# Patient Record
Sex: Male | Born: 1935 | Race: White | Hispanic: No | Marital: Married | Smoking: Never smoker
Health system: Southern US, Community
[De-identification: ages and names within clinical notes are randomized; demographics above are authoritative.]

## PROBLEM LIST (undated history)

## (undated) DIAGNOSIS — I340 Nonrheumatic mitral (valve) insufficiency: Secondary | ICD-10-CM

## (undated) DIAGNOSIS — I2510 Atherosclerotic heart disease of native coronary artery without angina pectoris: Secondary | ICD-10-CM

## (undated) DIAGNOSIS — E039 Hypothyroidism, unspecified: Secondary | ICD-10-CM

## (undated) DIAGNOSIS — I480 Paroxysmal atrial fibrillation: Secondary | ICD-10-CM

## (undated) DIAGNOSIS — E785 Hyperlipidemia, unspecified: Secondary | ICD-10-CM

## (undated) DIAGNOSIS — I428 Other cardiomyopathies: Secondary | ICD-10-CM

## (undated) DIAGNOSIS — Z95810 Presence of automatic (implantable) cardiac defibrillator: Secondary | ICD-10-CM

## (undated) DIAGNOSIS — I10 Essential (primary) hypertension: Secondary | ICD-10-CM

## (undated) DIAGNOSIS — Z7901 Long term (current) use of anticoagulants: Secondary | ICD-10-CM

## (undated) DIAGNOSIS — I509 Heart failure, unspecified: Secondary | ICD-10-CM

## (undated) HISTORY — DX: Other cardiomyopathies: I42.8

## (undated) HISTORY — DX: Nonrheumatic mitral (valve) insufficiency: I34.0

## (undated) HISTORY — PX: INSERT / REPLACE / REMOVE PACEMAKER: SUR710

## (undated) HISTORY — DX: Hyperlipidemia, unspecified: E78.5

## (undated) HISTORY — DX: Paroxysmal atrial fibrillation: I48.0

## (undated) HISTORY — DX: Presence of automatic (implantable) cardiac defibrillator: Z95.810

## (undated) HISTORY — DX: Hypothyroidism, unspecified: E03.9

## (undated) HISTORY — DX: Long term (current) use of anticoagulants: Z79.01

## (undated) HISTORY — DX: Atherosclerotic heart disease of native coronary artery without angina pectoris: I25.10

## (undated) HISTORY — DX: Essential (primary) hypertension: I10

---

## 2000-08-15 ENCOUNTER — Ambulatory Visit (HOSPITAL_BASED_OUTPATIENT_CLINIC_OR_DEPARTMENT_OTHER): Admission: RE | Admit: 2000-08-15 | Discharge: 2000-08-15 | Payer: Self-pay | Admitting: Internal Medicine

## 2000-10-15 ENCOUNTER — Encounter: Payer: Self-pay | Admitting: General Surgery

## 2000-10-17 ENCOUNTER — Ambulatory Visit (HOSPITAL_COMMUNITY): Admission: RE | Admit: 2000-10-17 | Discharge: 2000-10-17 | Payer: Self-pay | Admitting: General Surgery

## 2001-09-04 DIAGNOSIS — I480 Paroxysmal atrial fibrillation: Secondary | ICD-10-CM

## 2001-09-04 DIAGNOSIS — I2510 Atherosclerotic heart disease of native coronary artery without angina pectoris: Secondary | ICD-10-CM

## 2001-09-04 HISTORY — DX: Atherosclerotic heart disease of native coronary artery without angina pectoris: I25.10

## 2001-09-04 HISTORY — PX: CORONARY ARTERY BYPASS GRAFT: SHX141

## 2001-09-04 HISTORY — DX: Paroxysmal atrial fibrillation: I48.0

## 2002-03-23 ENCOUNTER — Inpatient Hospital Stay (HOSPITAL_COMMUNITY): Admission: EM | Admit: 2002-03-23 | Discharge: 2002-04-05 | Payer: Self-pay | Admitting: Cardiology

## 2002-03-23 ENCOUNTER — Encounter: Payer: Self-pay | Admitting: Emergency Medicine

## 2002-03-23 ENCOUNTER — Encounter: Payer: Self-pay | Admitting: Cardiology

## 2002-03-23 ENCOUNTER — Emergency Department (HOSPITAL_COMMUNITY): Admission: EM | Admit: 2002-03-23 | Discharge: 2002-03-23 | Payer: Self-pay | Admitting: Emergency Medicine

## 2002-03-25 ENCOUNTER — Encounter: Payer: Self-pay | Admitting: Cardiology

## 2002-03-27 ENCOUNTER — Encounter: Payer: Self-pay | Admitting: Cardiology

## 2002-03-30 ENCOUNTER — Encounter: Payer: Self-pay | Admitting: Cardiology

## 2002-03-31 ENCOUNTER — Encounter: Payer: Self-pay | Admitting: Surgery

## 2002-04-01 ENCOUNTER — Encounter: Payer: Self-pay | Admitting: Surgery

## 2002-04-02 ENCOUNTER — Encounter: Payer: Self-pay | Admitting: Surgery

## 2002-04-29 ENCOUNTER — Encounter (HOSPITAL_COMMUNITY): Admission: RE | Admit: 2002-04-29 | Discharge: 2002-05-29 | Payer: Self-pay | Admitting: Cardiology

## 2002-05-30 ENCOUNTER — Encounter (HOSPITAL_COMMUNITY): Admission: RE | Admit: 2002-05-30 | Discharge: 2002-06-29 | Payer: Self-pay | Admitting: Cardiology

## 2002-06-30 ENCOUNTER — Encounter (HOSPITAL_COMMUNITY): Admission: RE | Admit: 2002-06-30 | Discharge: 2002-07-30 | Payer: Self-pay | Admitting: Cardiology

## 2002-08-06 ENCOUNTER — Encounter (HOSPITAL_COMMUNITY): Admission: RE | Admit: 2002-08-06 | Discharge: 2002-09-05 | Payer: Self-pay | Admitting: Cardiology

## 2004-10-31 ENCOUNTER — Ambulatory Visit: Payer: Self-pay | Admitting: Cardiology

## 2005-09-04 HISTORY — PX: CARDIAC DEFIBRILLATOR PLACEMENT: SHX171

## 2005-10-24 ENCOUNTER — Ambulatory Visit: Payer: Self-pay | Admitting: Cardiology

## 2005-11-01 ENCOUNTER — Ambulatory Visit: Payer: Self-pay

## 2005-11-01 ENCOUNTER — Encounter: Payer: Self-pay | Admitting: Cardiology

## 2005-11-21 ENCOUNTER — Ambulatory Visit: Payer: Self-pay | Admitting: Cardiology

## 2005-12-25 ENCOUNTER — Ambulatory Visit: Payer: Self-pay | Admitting: Cardiology

## 2006-01-15 ENCOUNTER — Ambulatory Visit: Payer: Self-pay

## 2006-01-15 ENCOUNTER — Encounter: Payer: Self-pay | Admitting: Cardiology

## 2006-01-25 ENCOUNTER — Ambulatory Visit: Payer: Self-pay

## 2006-01-25 ENCOUNTER — Ambulatory Visit: Payer: Self-pay | Admitting: Cardiology

## 2006-01-31 ENCOUNTER — Observation Stay (HOSPITAL_COMMUNITY): Admission: RE | Admit: 2006-01-31 | Discharge: 2006-02-01 | Payer: Self-pay | Admitting: Internal Medicine

## 2006-01-31 ENCOUNTER — Ambulatory Visit: Payer: Self-pay | Admitting: Internal Medicine

## 2006-02-14 ENCOUNTER — Ambulatory Visit: Payer: Self-pay

## 2006-05-22 ENCOUNTER — Ambulatory Visit: Payer: Self-pay | Admitting: Internal Medicine

## 2006-06-15 ENCOUNTER — Ambulatory Visit: Payer: Self-pay | Admitting: Cardiology

## 2006-06-19 ENCOUNTER — Ambulatory Visit: Payer: Self-pay | Admitting: Cardiology

## 2006-06-26 ENCOUNTER — Ambulatory Visit: Payer: Self-pay | Admitting: *Deleted

## 2006-07-05 ENCOUNTER — Ambulatory Visit: Payer: Self-pay | Admitting: Cardiology

## 2006-07-19 ENCOUNTER — Ambulatory Visit: Payer: Self-pay | Admitting: Cardiology

## 2006-07-23 ENCOUNTER — Ambulatory Visit: Payer: Self-pay | Admitting: Cardiology

## 2006-08-07 ENCOUNTER — Ambulatory Visit (HOSPITAL_COMMUNITY): Admission: RE | Admit: 2006-08-07 | Discharge: 2006-08-07 | Payer: Self-pay | Admitting: Cardiology

## 2006-08-07 ENCOUNTER — Ambulatory Visit: Payer: Self-pay | Admitting: Cardiology

## 2006-08-21 ENCOUNTER — Ambulatory Visit: Payer: Self-pay | Admitting: Cardiology

## 2006-09-06 ENCOUNTER — Ambulatory Visit: Payer: Self-pay | Admitting: Cardiology

## 2006-09-10 ENCOUNTER — Ambulatory Visit: Payer: Self-pay | Admitting: Cardiology

## 2006-09-10 ENCOUNTER — Ambulatory Visit: Payer: Self-pay | Admitting: Internal Medicine

## 2006-09-17 ENCOUNTER — Ambulatory Visit: Payer: Self-pay | Admitting: Cardiology

## 2006-10-24 ENCOUNTER — Ambulatory Visit: Payer: Self-pay | Admitting: Internal Medicine

## 2006-10-24 ENCOUNTER — Ambulatory Visit: Payer: Self-pay | Admitting: Cardiology

## 2006-10-31 ENCOUNTER — Ambulatory Visit: Payer: Self-pay | Admitting: Cardiovascular Disease

## 2006-11-03 ENCOUNTER — Emergency Department (HOSPITAL_COMMUNITY): Admission: EM | Admit: 2006-11-03 | Discharge: 2006-11-03 | Payer: Self-pay | Admitting: Emergency Medicine

## 2006-11-29 ENCOUNTER — Ambulatory Visit: Payer: Self-pay | Admitting: Cardiology

## 2006-12-27 ENCOUNTER — Ambulatory Visit: Payer: Self-pay | Admitting: Cardiology

## 2007-01-17 ENCOUNTER — Ambulatory Visit: Payer: Self-pay | Admitting: Internal Medicine

## 2007-01-17 ENCOUNTER — Ambulatory Visit: Payer: Self-pay

## 2007-01-17 ENCOUNTER — Encounter: Payer: Self-pay | Admitting: Cardiovascular Disease

## 2007-01-22 ENCOUNTER — Ambulatory Visit: Payer: Self-pay | Admitting: Cardiology

## 2007-02-26 ENCOUNTER — Ambulatory Visit: Payer: Self-pay | Admitting: Cardiology

## 2007-03-19 ENCOUNTER — Ambulatory Visit: Payer: Self-pay | Admitting: Internal Medicine

## 2007-04-01 ENCOUNTER — Ambulatory Visit: Payer: Self-pay | Admitting: Internal Medicine

## 2007-04-08 ENCOUNTER — Ambulatory Visit: Payer: Self-pay | Admitting: Cardiology

## 2007-04-22 ENCOUNTER — Ambulatory Visit: Payer: Self-pay | Admitting: Cardiology

## 2007-05-02 ENCOUNTER — Ambulatory Visit: Payer: Self-pay | Admitting: Cardiology

## 2007-05-02 ENCOUNTER — Ambulatory Visit: Payer: Self-pay | Admitting: Internal Medicine

## 2007-05-27 ENCOUNTER — Ambulatory Visit: Payer: Self-pay | Admitting: Cardiology

## 2007-07-01 ENCOUNTER — Ambulatory Visit: Payer: Self-pay | Admitting: Internal Medicine

## 2007-07-15 ENCOUNTER — Ambulatory Visit: Payer: Self-pay | Admitting: Cardiology

## 2007-07-25 ENCOUNTER — Ambulatory Visit: Payer: Self-pay | Admitting: Internal Medicine

## 2007-08-15 ENCOUNTER — Ambulatory Visit: Payer: Self-pay | Admitting: Internal Medicine

## 2007-08-28 ENCOUNTER — Emergency Department (HOSPITAL_COMMUNITY): Admission: EM | Admit: 2007-08-28 | Discharge: 2007-08-28 | Payer: Self-pay | Admitting: Emergency Medicine

## 2007-09-02 ENCOUNTER — Ambulatory Visit: Payer: Self-pay | Admitting: Orthopedic Surgery

## 2007-09-02 ENCOUNTER — Inpatient Hospital Stay (HOSPITAL_COMMUNITY): Admission: EM | Admit: 2007-09-02 | Discharge: 2007-09-05 | Payer: Self-pay | Admitting: Emergency Medicine

## 2007-09-02 ENCOUNTER — Ambulatory Visit: Payer: Self-pay | Admitting: Cardiovascular Disease

## 2007-09-03 ENCOUNTER — Encounter (INDEPENDENT_AMBULATORY_CARE_PROVIDER_SITE_OTHER): Payer: Self-pay | Admitting: *Deleted

## 2007-09-04 ENCOUNTER — Encounter: Payer: Self-pay | Admitting: Orthopedic Surgery

## 2007-09-05 ENCOUNTER — Encounter: Payer: Self-pay | Admitting: Orthopedic Surgery

## 2007-09-10 ENCOUNTER — Encounter: Payer: Self-pay | Admitting: Orthopedic Surgery

## 2007-09-11 ENCOUNTER — Telehealth: Payer: Self-pay | Admitting: Orthopedic Surgery

## 2007-09-19 ENCOUNTER — Ambulatory Visit: Payer: Self-pay | Admitting: Internal Medicine

## 2007-09-26 ENCOUNTER — Ambulatory Visit: Payer: Self-pay | Admitting: Orthopedic Surgery

## 2007-09-30 ENCOUNTER — Encounter (HOSPITAL_COMMUNITY): Admission: RE | Admit: 2007-09-30 | Discharge: 2007-10-30 | Payer: Self-pay | Admitting: Orthopedic Surgery

## 2007-09-30 ENCOUNTER — Encounter: Payer: Self-pay | Admitting: Orthopedic Surgery

## 2007-10-11 ENCOUNTER — Encounter: Payer: Self-pay | Admitting: Orthopedic Surgery

## 2007-11-15 ENCOUNTER — Ambulatory Visit: Payer: Self-pay | Admitting: Cardiology

## 2007-11-28 ENCOUNTER — Ambulatory Visit: Payer: Self-pay | Admitting: Orthopedic Surgery

## 2007-12-06 ENCOUNTER — Ambulatory Visit: Payer: Self-pay | Admitting: Cardiology

## 2008-01-06 ENCOUNTER — Ambulatory Visit: Payer: Self-pay | Admitting: Cardiology

## 2008-01-13 ENCOUNTER — Ambulatory Visit: Payer: Self-pay | Admitting: Internal Medicine

## 2008-01-13 ENCOUNTER — Ambulatory Visit: Payer: Self-pay | Admitting: Cardiology

## 2008-02-03 ENCOUNTER — Ambulatory Visit: Payer: Self-pay | Admitting: Cardiology

## 2008-03-04 ENCOUNTER — Ambulatory Visit: Payer: Self-pay | Admitting: Cardiovascular Disease

## 2008-03-18 ENCOUNTER — Ambulatory Visit: Payer: Self-pay | Admitting: Cardiology

## 2008-04-20 ENCOUNTER — Ambulatory Visit: Payer: Self-pay | Admitting: Internal Medicine

## 2008-04-22 ENCOUNTER — Ambulatory Visit: Payer: Self-pay | Admitting: Cardiology

## 2008-05-20 ENCOUNTER — Ambulatory Visit: Payer: Self-pay | Admitting: Cardiology

## 2008-06-18 ENCOUNTER — Ambulatory Visit: Payer: Self-pay | Admitting: Cardiology

## 2008-07-20 ENCOUNTER — Ambulatory Visit: Payer: Self-pay | Admitting: Internal Medicine

## 2008-07-20 ENCOUNTER — Ambulatory Visit: Payer: Self-pay | Admitting: Cardiology

## 2008-07-23 ENCOUNTER — Ambulatory Visit: Payer: Self-pay | Admitting: Cardiology

## 2008-08-07 ENCOUNTER — Ambulatory Visit: Payer: Self-pay | Admitting: Cardiology

## 2008-08-07 LAB — CONVERTED CEMR LAB
CO2: 30 meq/L (ref 19–32)
Calcium: 8.8 mg/dL (ref 8.4–10.5)
Chloride: 108 meq/L (ref 96–112)
Creatinine, Ser: 1.2 mg/dL (ref 0.4–1.5)
Potassium: 4.3 meq/L (ref 3.5–5.1)
Sodium: 143 meq/L (ref 135–145)

## 2008-08-20 ENCOUNTER — Ambulatory Visit: Payer: Self-pay | Admitting: Cardiology

## 2008-09-15 ENCOUNTER — Ambulatory Visit: Payer: Self-pay

## 2008-09-15 ENCOUNTER — Ambulatory Visit: Payer: Self-pay | Admitting: Cardiology

## 2008-09-15 ENCOUNTER — Encounter: Payer: Self-pay | Admitting: Cardiology

## 2008-09-17 ENCOUNTER — Ambulatory Visit: Payer: Self-pay | Admitting: Cardiology

## 2008-10-08 ENCOUNTER — Encounter: Payer: Self-pay | Admitting: Internal Medicine

## 2008-10-29 ENCOUNTER — Ambulatory Visit: Payer: Self-pay | Admitting: Cardiology

## 2008-11-03 ENCOUNTER — Ambulatory Visit: Payer: Self-pay | Admitting: Internal Medicine

## 2008-11-26 ENCOUNTER — Ambulatory Visit: Payer: Self-pay | Admitting: Cardiology

## 2008-12-17 ENCOUNTER — Ambulatory Visit: Payer: Self-pay | Admitting: Cardiology

## 2009-01-14 ENCOUNTER — Ambulatory Visit: Payer: Self-pay | Admitting: Cardiology

## 2009-01-28 ENCOUNTER — Ambulatory Visit: Payer: Self-pay | Admitting: Internal Medicine

## 2009-02-18 ENCOUNTER — Ambulatory Visit: Payer: Self-pay | Admitting: Cardiology

## 2009-03-25 ENCOUNTER — Ambulatory Visit: Payer: Self-pay | Admitting: Cardiology

## 2009-03-29 ENCOUNTER — Ambulatory Visit: Payer: Self-pay | Admitting: Cardiology

## 2009-04-14 ENCOUNTER — Encounter: Payer: Self-pay | Admitting: Internal Medicine

## 2009-04-14 ENCOUNTER — Ambulatory Visit: Payer: Self-pay | Admitting: Internal Medicine

## 2009-04-19 ENCOUNTER — Encounter: Payer: Self-pay | Admitting: *Deleted

## 2009-04-29 ENCOUNTER — Ambulatory Visit: Payer: Self-pay | Admitting: Cardiology

## 2009-04-29 LAB — CONVERTED CEMR LAB: POC INR: 2.8

## 2009-05-17 ENCOUNTER — Encounter: Payer: Self-pay | Admitting: Cardiology

## 2009-05-27 ENCOUNTER — Ambulatory Visit: Payer: Self-pay | Admitting: Cardiology

## 2009-06-08 ENCOUNTER — Ambulatory Visit: Payer: Self-pay | Admitting: Cardiology

## 2009-06-10 ENCOUNTER — Encounter: Payer: Self-pay | Admitting: Cardiology

## 2009-06-17 ENCOUNTER — Ambulatory Visit: Payer: Self-pay | Admitting: Cardiology

## 2009-07-15 ENCOUNTER — Ambulatory Visit: Payer: Self-pay | Admitting: Cardiology

## 2009-07-15 LAB — CONVERTED CEMR LAB: POC INR: 2.1

## 2009-08-04 ENCOUNTER — Ambulatory Visit: Payer: Self-pay | Admitting: Internal Medicine

## 2009-08-12 ENCOUNTER — Ambulatory Visit: Payer: Self-pay | Admitting: Cardiology

## 2009-08-12 LAB — CONVERTED CEMR LAB: POC INR: 2.5

## 2009-09-01 ENCOUNTER — Ambulatory Visit: Payer: Self-pay | Admitting: Cardiology

## 2009-09-10 ENCOUNTER — Encounter: Payer: Self-pay | Admitting: Internal Medicine

## 2009-09-10 ENCOUNTER — Ambulatory Visit: Payer: Self-pay | Admitting: Internal Medicine

## 2009-09-15 ENCOUNTER — Encounter: Payer: Self-pay | Admitting: Internal Medicine

## 2009-09-15 LAB — CONVERTED CEMR LAB
BUN: 25 mg/dL — ABNORMAL HIGH (ref 6–23)
Chloride: 108 meq/L (ref 96–112)
Creatinine, Ser: 1.43 mg/dL (ref 0.40–1.50)
Eosinophils Relative: 6 % — ABNORMAL HIGH (ref 0–5)
Hemoglobin: 12.8 g/dL — ABNORMAL LOW (ref 13.0–17.0)
Lymphs Abs: 1 10*3/uL (ref 0.7–4.0)
MCHC: 32.8 g/dL (ref 30.0–36.0)
Monocytes Relative: 8 % (ref 3–12)
Neutrophils Relative %: 72 % (ref 43–77)
Prothrombin Time: 34.8 s — ABNORMAL HIGH (ref 11.6–15.2)
RBC: 4.04 M/uL — ABNORMAL LOW (ref 4.22–5.81)
RDW: 14.2 % (ref 11.5–15.5)
Sodium: 142 meq/L (ref 135–145)
aPTT: 44 s — ABNORMAL HIGH (ref 24–37)

## 2009-09-22 ENCOUNTER — Ambulatory Visit: Payer: Self-pay | Admitting: Internal Medicine

## 2009-09-22 ENCOUNTER — Ambulatory Visit (HOSPITAL_COMMUNITY): Admission: RE | Admit: 2009-09-22 | Discharge: 2009-09-22 | Payer: Self-pay | Admitting: Internal Medicine

## 2009-09-30 ENCOUNTER — Ambulatory Visit: Payer: Self-pay | Admitting: Cardiovascular Disease

## 2009-09-30 ENCOUNTER — Encounter: Payer: Self-pay | Admitting: Cardiology

## 2009-09-30 LAB — CONVERTED CEMR LAB: POC INR: 3.7

## 2009-10-07 ENCOUNTER — Ambulatory Visit: Payer: Self-pay | Admitting: Cardiology

## 2009-10-12 ENCOUNTER — Ambulatory Visit: Payer: Self-pay | Admitting: Cardiology

## 2009-10-14 ENCOUNTER — Ambulatory Visit: Payer: Self-pay | Admitting: Cardiology

## 2009-10-14 LAB — CONVERTED CEMR LAB: POC INR: 3.1

## 2009-10-18 LAB — CONVERTED CEMR LAB
AST: 34 units/L (ref 0–37)
Bilirubin, Direct: 0.3 mg/dL (ref 0.0–0.3)
Total Bilirubin: 0.9 mg/dL (ref 0.3–1.2)

## 2009-11-01 ENCOUNTER — Ambulatory Visit: Payer: Self-pay | Admitting: Cardiology

## 2009-11-01 ENCOUNTER — Ambulatory Visit: Payer: Self-pay

## 2009-11-11 ENCOUNTER — Ambulatory Visit: Payer: Self-pay | Admitting: Cardiology

## 2009-11-11 LAB — CONVERTED CEMR LAB: POC INR: 4.5

## 2009-11-25 ENCOUNTER — Ambulatory Visit: Payer: Self-pay | Admitting: Cardiology

## 2009-12-09 ENCOUNTER — Ambulatory Visit: Payer: Self-pay | Admitting: Internal Medicine

## 2009-12-09 ENCOUNTER — Ambulatory Visit: Payer: Self-pay | Admitting: Cardiology

## 2009-12-13 ENCOUNTER — Ambulatory Visit: Payer: Self-pay | Admitting: Cardiology

## 2009-12-13 ENCOUNTER — Encounter: Payer: Self-pay | Admitting: Internal Medicine

## 2009-12-27 LAB — CONVERTED CEMR LAB
Bilirubin, Direct: 0.2 mg/dL (ref 0.0–0.3)
Cholesterol: 135 mg/dL (ref 0–200)
HDL: 59.7 mg/dL (ref >39.00)
LDL Cholesterol: 69 mg/dL (ref 0–99)
Total Bilirubin: 1.3 mg/dL — ABNORMAL HIGH (ref 0.3–1.2)
Total CHOL/HDL Ratio: 2
Triglycerides: 34 mg/dL (ref 0.0–149.0)
VLDL: 6.8 mg/dL (ref 0.0–40.0)

## 2009-12-30 ENCOUNTER — Ambulatory Visit: Payer: Self-pay | Admitting: Cardiology

## 2010-01-06 ENCOUNTER — Telehealth: Payer: Self-pay | Admitting: Cardiology

## 2010-01-12 ENCOUNTER — Telehealth: Payer: Self-pay | Admitting: Cardiology

## 2010-01-20 ENCOUNTER — Ambulatory Visit: Payer: Self-pay | Admitting: Cardiology

## 2010-01-21 ENCOUNTER — Encounter: Payer: Self-pay | Admitting: Cardiology

## 2010-01-25 ENCOUNTER — Encounter (INDEPENDENT_AMBULATORY_CARE_PROVIDER_SITE_OTHER): Payer: Self-pay | Admitting: *Deleted

## 2010-01-25 ENCOUNTER — Ambulatory Visit: Payer: Self-pay | Admitting: Internal Medicine

## 2010-01-25 ENCOUNTER — Encounter: Payer: Self-pay | Admitting: Cardiology

## 2010-01-25 LAB — CONVERTED CEMR LAB
ALT: 21 units/L
AST: 29 units/L
Alkaline Phosphatase: 65 units/L
BUN: 34 mg/dL
Basophils Absolute: 0 10*3/uL
Basophils Relative: 1 %
Calcium: 8.7 mg/dL
Eosinophils Absolute: 0.2 10*3/uL
HCT: 40.7 %
Hemoglobin: 12.7 g/dL
MCV: 100.2 fL
Monocytes Absolute: 0.9 10*3/uL
Platelets: 167 10*3/uL
Potassium: 4.4 meq/L
RDW: 15.9 %
Sodium: 140 meq/L

## 2010-02-02 ENCOUNTER — Telehealth (INDEPENDENT_AMBULATORY_CARE_PROVIDER_SITE_OTHER): Payer: Self-pay | Admitting: *Deleted

## 2010-02-03 ENCOUNTER — Encounter (INDEPENDENT_AMBULATORY_CARE_PROVIDER_SITE_OTHER): Payer: Self-pay | Admitting: *Deleted

## 2010-02-03 ENCOUNTER — Ambulatory Visit: Payer: Self-pay | Admitting: Cardiology

## 2010-02-03 LAB — CONVERTED CEMR LAB
Albumin: 3.9 g/dL (ref 3.5–5.2)
Alkaline Phosphatase: 65 units/L (ref 39–117)
Basophils Absolute: 0 10*3/uL (ref 0.0–0.1)
CO2: 30 meq/L (ref 19–32)
Calcium: 8.7 mg/dL (ref 8.4–10.5)
Chloride: 101 meq/L (ref 96–112)
Creatinine, Ser: 1.89 mg/dL — ABNORMAL HIGH (ref 0.40–1.50)
Lymphocytes Relative: 18 % (ref 12–46)
MCV: 100.2 fL — ABNORMAL HIGH (ref 78.0–100.0)
Monocytes Absolute: 0.9 10*3/uL (ref 0.1–1.0)
Neutro Abs: 3.8 10*3/uL (ref 1.7–7.7)
Neutrophils Relative %: 64 % (ref 43–77)
POC INR: 2.8
Platelets: 167 10*3/uL (ref 150–400)
Potassium: 4.4 meq/L (ref 3.5–5.3)
RBC: 4.06 M/uL — ABNORMAL LOW (ref 4.22–5.81)
RDW: 15.9 % — ABNORMAL HIGH (ref 11.5–15.5)
Sodium: 140 meq/L (ref 135–145)

## 2010-02-17 ENCOUNTER — Ambulatory Visit: Payer: Self-pay | Admitting: Cardiology

## 2010-02-17 LAB — CONVERTED CEMR LAB: POC INR: 2.5

## 2010-03-03 ENCOUNTER — Telehealth (INDEPENDENT_AMBULATORY_CARE_PROVIDER_SITE_OTHER): Payer: Self-pay

## 2010-03-10 ENCOUNTER — Ambulatory Visit: Payer: Self-pay | Admitting: Internal Medicine

## 2010-03-14 ENCOUNTER — Ambulatory Visit: Payer: Self-pay | Admitting: Internal Medicine

## 2010-03-14 ENCOUNTER — Ambulatory Visit: Payer: Self-pay | Admitting: Cardiology

## 2010-03-14 LAB — CONVERTED CEMR LAB: POC INR: 2.6

## 2010-03-16 ENCOUNTER — Encounter: Payer: Self-pay | Admitting: Internal Medicine

## 2010-04-12 ENCOUNTER — Ambulatory Visit: Payer: Self-pay | Admitting: Cardiology

## 2010-04-12 ENCOUNTER — Encounter (INDEPENDENT_AMBULATORY_CARE_PROVIDER_SITE_OTHER): Payer: Self-pay | Admitting: *Deleted

## 2010-04-12 LAB — CONVERTED CEMR LAB
BUN: 36 mg/dL
BUN: 36 mg/dL — ABNORMAL HIGH (ref 6–23)
CO2: 28 meq/L
Calcium: 9.3 mg/dL
Chloride: 106 meq/L
Glucose, Bld: 97 mg/dL
Glucose, Bld: 97 mg/dL (ref 70–99)
Potassium: 4.8 meq/L (ref 3.5–5.3)
Sodium: 144 meq/L

## 2010-04-13 ENCOUNTER — Encounter (INDEPENDENT_AMBULATORY_CARE_PROVIDER_SITE_OTHER): Payer: Self-pay | Admitting: *Deleted

## 2010-05-12 ENCOUNTER — Ambulatory Visit: Payer: Self-pay | Admitting: Cardiology

## 2010-05-12 LAB — CONVERTED CEMR LAB: POC INR: 2.4

## 2010-05-30 ENCOUNTER — Encounter (INDEPENDENT_AMBULATORY_CARE_PROVIDER_SITE_OTHER): Payer: Self-pay | Admitting: *Deleted

## 2010-06-09 ENCOUNTER — Ambulatory Visit: Payer: Self-pay | Admitting: Cardiology

## 2010-06-09 LAB — CONVERTED CEMR LAB: POC INR: 2.4

## 2010-06-22 ENCOUNTER — Encounter: Payer: Self-pay | Admitting: Internal Medicine

## 2010-07-07 ENCOUNTER — Ambulatory Visit: Payer: Self-pay | Admitting: Cardiology

## 2010-07-19 ENCOUNTER — Ambulatory Visit: Payer: Self-pay | Admitting: Internal Medicine

## 2010-07-20 ENCOUNTER — Encounter: Payer: Self-pay | Admitting: Internal Medicine

## 2010-08-04 ENCOUNTER — Ambulatory Visit: Payer: Self-pay | Admitting: Cardiology

## 2010-08-19 ENCOUNTER — Telehealth: Payer: Self-pay | Admitting: Internal Medicine

## 2010-08-30 ENCOUNTER — Encounter (INDEPENDENT_AMBULATORY_CARE_PROVIDER_SITE_OTHER): Payer: Self-pay | Admitting: *Deleted

## 2010-08-31 ENCOUNTER — Ambulatory Visit
Admission: RE | Admit: 2010-08-31 | Discharge: 2010-08-31 | Payer: Self-pay | Source: Home / Self Care | Attending: Cardiology | Admitting: Cardiology

## 2010-08-31 DIAGNOSIS — E039 Hypothyroidism, unspecified: Secondary | ICD-10-CM | POA: Insufficient documentation

## 2010-08-31 DIAGNOSIS — I080 Rheumatic disorders of both mitral and aortic valves: Secondary | ICD-10-CM | POA: Insufficient documentation

## 2010-08-31 DIAGNOSIS — E785 Hyperlipidemia, unspecified: Secondary | ICD-10-CM | POA: Insufficient documentation

## 2010-09-01 ENCOUNTER — Ambulatory Visit: Payer: Self-pay | Admitting: Cardiology

## 2010-09-01 ENCOUNTER — Ambulatory Visit (HOSPITAL_COMMUNITY)
Admission: RE | Admit: 2010-09-01 | Discharge: 2010-09-01 | Payer: Self-pay | Source: Home / Self Care | Attending: Cardiology | Admitting: Cardiology

## 2010-09-15 LAB — CONVERTED CEMR LAB
ALT: 15 units/L (ref 0–53)
AST: 30 units/L (ref 0–37)
Albumin: 4.2 g/dL (ref 3.5–5.2)
Alkaline Phosphatase: 63 units/L (ref 39–117)
BUN: 24 mg/dL — ABNORMAL HIGH (ref 6–23)
Basophils Absolute: 0 10*3/uL (ref 0.0–0.1)
Basophils Relative: 1 % (ref 0–1)
Calcium: 9.3 mg/dL (ref 8.4–10.5)
Chloride: 106 meq/L (ref 96–112)
Eosinophils Absolute: 0.3 10*3/uL (ref 0.0–0.7)
HDL: 55 mg/dL (ref >39)
LDL Cholesterol: 91 mg/dL (ref 0–99)
MCHC: 32.5 g/dL (ref 30.0–36.0)
MCV: 96.8 fL (ref 78.0–100.0)
Neutro Abs: 4.4 10*3/uL (ref 1.7–7.7)
Neutrophils Relative %: 68 % (ref 43–77)
Platelets: 167 10*3/uL (ref 150–400)
Potassium: 4.1 meq/L (ref 3.5–5.3)
Pro B Natriuretic peptide (BNP): 533.9 pg/mL — ABNORMAL HIGH (ref 0.0–100.0)
RDW: 13.4 % (ref 11.5–15.5)
Total CHOL/HDL Ratio: 2.9

## 2010-09-29 ENCOUNTER — Ambulatory Visit: Admission: RE | Admit: 2010-09-29 | Discharge: 2010-09-29 | Payer: Self-pay | Source: Home / Self Care

## 2010-09-29 ENCOUNTER — Encounter (INDEPENDENT_AMBULATORY_CARE_PROVIDER_SITE_OTHER): Payer: Self-pay | Admitting: *Deleted

## 2010-09-29 LAB — CONVERTED CEMR LAB
OCCULT 2: NEGATIVE
OCCULT 3: NEGATIVE
POC INR: 2.1

## 2010-10-03 ENCOUNTER — Encounter (INDEPENDENT_AMBULATORY_CARE_PROVIDER_SITE_OTHER): Payer: Self-pay | Admitting: *Deleted

## 2010-10-06 NOTE — Progress Notes (Signed)
**Note De-Identified [place] Obfuscation** Summary: Refill for Pacerone  Phone Note Outgoing Call   Call placed by: Larita Fife Sila Sarsfield LPN,  March 03, 2010 9:03 AM Summary of Call: Left message with Mrs. Morell for pt. to call this office. Refill request for Pacerone received but med is not listed in pt's med list.   Follow-up for Phone Call        Pt. states that he is still taking Pacerone and does not remember it being d/c. Mr. Hopwood has appt. with Dr. Ladona Ridgel on 03-14-10 and wants to talk with him about this med and if he should be taking or not. Pt. also states that he has enough Pacerone until visit. Follow-up by: Larita Fife Avryl Roehm LPN,  March 04, 980 10:21 AM

## 2010-10-06 NOTE — Medication Information (Signed)
Summary: ccr-lr  Anticoagulant Therapy  Managed by: Vashti Hey, RN Referring MD: Riley Kill PCP: Dr Leighton Ruff MD: Dietrich Pates MD, Molly Maduro Indication 1: Atrial Fibrillation (ICD-427.31) Lab Used: [facility] [place] Site: [place] INR POC 2.2  Dietary changes: no    Health status changes: no    Bleeding/hemorrhagic complications: no    Recent/future hospitalizations: no    Any changes in medication regimen? no    Recent/future dental: no  Any missed doses?: no       Is patient compliant with meds? yes       Allergies: No Known Drug Allergies  Anticoagulation Management History:      The patient is taking warfarin and comes in today for a routine follow up visit.  Positive risk factors for bleeding include an age of 75 years or older and presence of serious comorbidities.  The bleeding index is 'intermediate risk'.  Positive CHADS2 values include History of CHF and Age > 37 years old.  The start date was 06/16/2006.  His last INR was 3.49.  Anticoagulation responsible provider: Dietrich Pates MD, Molly Maduro.  INR POC: 2.2.  Cuvette Lot#: [id number].  Exp: 05/2011.    Anticoagulation Management Assessment/Plan:      The patient's current anticoagulation dose is Warfarin sodium 2 mg tabs: Use as directed by Anticoagualtion Clinic.  The target INR is 2 - 3.  The next INR is due 09/29/2010.  Anticoagulation instructions were given to patient.  Results were reviewed/authorized by Vashti Hey, RN.  He was notified by Vashti Hey RN.         Prior Anticoagulation Instructions: INR 1.9 Take coumadin 1 1/2 tablets tonight then increase 1 tablet once daily except 1/2 tablet on Tuesdays  Current Anticoagulation Instructions: INR 2.2 Continue coumadin 2mg  once daily except 1mg  on Tuesdays

## 2010-10-06 NOTE — Medication Information (Signed)
Summary: ccr-lr  Anticoagulant Therapy  Managed by: Vashti Hey, RN Referring MD: Riley Kill PCP: Dr Leighton Ruff MD: Daleen Squibb MD, Maisie Fus Indication 1: Atrial Fibrillation (ICD-427.31) Lab Used: [facility] [place] Site: [place] INR POC 1.9  Dietary changes: no    Health status changes: no    Bleeding/hemorrhagic complications: no    Recent/future hospitalizations: no    Any changes in medication regimen? no    Recent/future dental: no  Any missed doses?: no       Is patient compliant with meds? yes       Allergies: No Known Drug Allergies  Anticoagulation Management History:      The patient is taking warfarin and comes in today for a routine follow up visit.  Positive risk factors for bleeding include an age of 75 years or older and presence of serious comorbidities.  The bleeding index is 'intermediate risk'.  Positive CHADS2 values include History of CHF and Age > 12 years old.  The start date was 06/16/2006.  His last INR was 3.49.  Anticoagulation responsible provider: Daleen Squibb MD, Maisie Fus.  INR POC: 1.9.  Cuvette Lot#: [id number].  Exp: 05/2011.    Anticoagulation Management Assessment/Plan:      The patient's current anticoagulation dose is Warfarin sodium 2 mg tabs: Use as directed by Anticoagualtion Clinic.  The target INR is 2 - 3.  The next INR is due 09/01/2010.  Anticoagulation instructions were given to patient.  Results were reviewed/authorized by Vashti Hey, RN.  He was notified by Vashti Hey RN.         Prior Anticoagulation Instructions: INR 2.0 Take coumadin 1 1/2 tablets tonight then resume 1 tablet once daily except 1/2 tablet on Tuesdays and Saturdays  Current Anticoagulation Instructions: INR 1.9 Take coumadin 1 1/2 tablets tonight then increase 1 tablet once daily except 1/2 tablet on Tuesdays

## 2010-10-06 NOTE — Medication Information (Signed)
Summary: ccr-lr  Anticoagulant Therapy  Managed by: Vashti Hey, RN Referring MD: Riley Kill PCP: Dr Leighton Ruff MD: Eden Emms MD, Theron Arista Indication 1: Atrial Fibrillation (ICD-427.31) Lab Used: [facility] [place] Site: [place] INR POC 3.7  Dietary changes: no    Health status changes: no    Bleeding/hemorrhagic complications: no    Recent/future hospitalizations: yes       Details: S/P DCCV 09/22/09  Any changes in medication regimen? no    Recent/future dental: no  Any missed doses?: no       Is patient compliant with meds? yes       Allergies: No Known Drug Allergies  Anticoagulation Management History:      The patient is taking warfarin and comes in today for a routine follow up visit.  Positive risk factors for bleeding include an age of 75 years or older.  The bleeding index is 'intermediate risk'.  Positive CHADS2 values include History of CHF and Age > 75 years old.  The start date was 06/16/2006.  His last INR was 3.49.  Anticoagulation responsible provider: Eden Emms MD, Theron Arista.  INR POC: 3.7.  Cuvette Lot#: [id number].  Exp: 10/11.    Anticoagulation Management Assessment/Plan:      The patient's current anticoagulation dose is Coumadin 5 mg tabs: as directed.  The target INR is 2 - 3.  The next INR is due 10/07/2009.  Anticoagulation instructions were given to patient.  Results were reviewed/authorized by Vashti Hey, RN.  He was notified by Vashti Hey RN.        Coagulation management information includes: S/P DCCV on 09/20/09.  Prior Anticoagulation Instructions: INR 2.8 Continue coumadin 2.5mg  once daily   Current Anticoagulation Instructions: INR 3.7 Hold couamdin tonight then resume 2.5mg  once daily

## 2010-10-06 NOTE — Medication Information (Signed)
Summary: ccr-lr  Anticoagulant Therapy  Managed by: Vashti Hey, RN Referring MD: Riley Kill PCP: Dr Leighton Ruff MD: Dietrich Pates MD, Molly Maduro Indication 1: Atrial Fibrillation (ICD-427.31) Lab Used: [facility] [place] Site: [place] INR POC 2.8  Dietary changes: no    Health status changes: no    Bleeding/hemorrhagic complications: no    Recent/future hospitalizations: no    Any changes in medication regimen? yes       Details: started on furosemide  Recent/future dental: no  Any missed doses?: no       Is patient compliant with meds? yes       Allergies: No Known Drug Allergies  Anticoagulation Management History:      The patient is taking warfarin and comes in today for a routine follow up visit.  Positive risk factors for bleeding include an age of 80 years or older and presence of serious comorbidities.  The bleeding index is 'intermediate risk'.  Positive CHADS2 values include History of CHF and Age > 22 years old.  The start date was 06/16/2006.  His last INR was 3.49.  Anticoagulation responsible provider: Dietrich Pates MD, Molly Maduro.  INR POC: 2.8.  Cuvette Lot#: [id number].  Exp: 10/11.    Anticoagulation Management Assessment/Plan:      The patient's current anticoagulation dose is Warfarin sodium 2 mg tabs: Use as directed by Anticoagualtion Clinic.  The target INR is 2 - 3.  The next INR is due 02/17/2010.  Anticoagulation instructions were given to patient.  Results were reviewed/authorized by Vashti Hey, RN.  He was notified by Vashti Hey RN.         Prior Anticoagulation Instructions: INR 2.9 Continue coumadin 2mg  once daily except 1mg  on Tuesdays and Saturdays  Current Anticoagulation Instructions: INR 2.8 Continue coumadin 2mg  once daily except 1mg  on Tuesdays and Saturdays

## 2010-10-06 NOTE — Medication Information (Signed)
Summary: ccr-lr  Anticoagulant Therapy  Managed by: Vashti Hey, RN Referring MD: Riley Kill PCP: Dr Leighton Ruff MD: Dietrich Pates MD, Molly Maduro Indication 1: Atrial Fibrillation (ICD-427.31) Lab Used: [facility] [place] Site: [place] INR POC 2.1  Dietary changes: no    Health status changes: no    Bleeding/hemorrhagic complications: no    Recent/future hospitalizations: no    Any changes in medication regimen? no    Recent/future dental: no  Any missed doses?: no       Is patient compliant with meds? yes       Allergies: No Known Drug Allergies  Anticoagulation Management History:      The patient is taking warfarin and comes in today for a routine follow up visit.  Positive risk factors for bleeding include an age of 75 years or older and presence of serious comorbidities.  The bleeding index is 'intermediate risk'.  Positive CHADS2 values include History of CHF and Age > 79 years old.  The start date was 06/16/2006.  His last INR was 3.49.  Anticoagulation responsible provider: Dietrich Pates MD, Molly Maduro.  INR POC: 2.1.  Cuvette Lot#: [id number].  Exp: 05/2011.    Anticoagulation Management Assessment/Plan:      The patient's current anticoagulation dose is Warfarin sodium 2 mg tabs: Use as directed by Anticoagualtion Clinic.  The target INR is 2 - 3.  The next INR is due 10/27/2010.  Anticoagulation instructions were given to patient.  Results were reviewed/authorized by Vashti Hey, RN.  He was notified by Vashti Hey RN.         Prior Anticoagulation Instructions: INR 2.2 Continue coumadin 2mg  once daily except 1mg  on Tuesdays  Current Anticoagulation Instructions: INR 2.1 Continue coumadin 1 tablet once daily except 1/2 tablet on Tuesdays

## 2010-10-06 NOTE — Medication Information (Signed)
Summary: ccr-lr  Anticoagulant Therapy  Managed by: Vashti Hey, RN Referring MD: Riley Kill PCP: Dr Leighton Ruff MD: Dietrich Pates MD, Molly Maduro Indication 1: Atrial Fibrillation (ICD-427.31) Lab Used: [facility] [place] Site: [place] INR POC 1.9  Dietary changes: no    Health status changes: no    Bleeding/hemorrhagic complications: no    Recent/future hospitalizations: no    Any changes in medication regimen? no    Recent/future dental: no  Any missed doses?: no       Is patient compliant with meds? yes       Allergies: No Known Drug Allergies  Anticoagulation Management History:      The patient is taking warfarin and comes in today for a routine follow up visit.  Positive risk factors for bleeding include an age of 75 years or older.  The bleeding index is 'intermediate risk'.  Positive CHADS2 values include History of CHF and Age > 40 years old.  The start date was 06/16/2006.  His last INR was 3.49.  Anticoagulation responsible provider: Dietrich Pates MD, Molly Maduro.  INR POC: 1.9.  Cuvette Lot#: [id number].  Exp: 10/11.    Anticoagulation Management Assessment/Plan:      The patient's current anticoagulation dose is Warfarin sodium 2 mg tabs: Use as directed by Anticoagualtion Clinic.  The target INR is 2 - 3.  The next INR is due 01/20/2010.  Anticoagulation instructions were given to patient.  Results were reviewed/authorized by Vashti Hey, RN.  He was notified by Vashti Hey RN.         Prior Anticoagulation Instructions: INR 1.9 Change warfarin to 2mg  tablet Take coumadin 2mg  once daily except 1mg  on Mondays, Wednesdays and Fridays  Current Anticoagulation Instructions: INR 1.9 Increase coumadin to 2mg  once daily except 1mg  on Tuesdays and Saturdays

## 2010-10-06 NOTE — Miscellaneous (Signed)
Summary: coumadin refill  Clinical Lists Changes  Medications: Rx of COUMADIN 5 MG TABS (WARFARIN SODIUM) as directed;  #60 x 3;  Signed;  Entered by: Teressa Lower RN;  Authorized by: Ronaldo Miyamoto, MD, [facility];  Method used: Electronically to [facility] [address] [place], [place], [place]  [address], Ph: [phone], Fax: [phone]    Prescriptions: COUMADIN 5 MG TABS (WARFARIN SODIUM) as directed  #60 x 3   Entered by:   Teressa Lower RN   Authorized by:   Ronaldo Miyamoto, MD, [facility]   Signed by:   Teressa Lower RN on 09/30/2009   Method used:   Electronically to        [facility]  [address] (retail)       [address]       [place], [place]  [address]       Ph: [phone]       Fax: [phone]   RxID:   [id number]

## 2010-10-06 NOTE — Progress Notes (Signed)
Summary: QUESTION ABOUT PACEPHONE CHECKS  Phone Note Call from Patient Call back at Home Phone [phone]   Caller: Patient Summary of Call: PT HAVE QUESTION  ABOUT PACERPHONE CHECK WANT TO KNOW HE CAN USE A CELL PHONE Initial call taken by: Judie Grieve,  August 19, 2010 10:51 AM  Follow-up for Phone Call        pt would like t talk to a nurse re device Follow-up by: Roe Coombs,  August 19, 2010 11:43 AM  Additional Follow-up for Phone Call Additional follow up Details #1::        spoke w/pt and made aware at this time latitude is not compatible with cellphone. Vella Kohler  August 19, 2010 12:34 PM

## 2010-10-06 NOTE — Assessment & Plan Note (Signed)
Summary: rov/pt wants to come to [place]   Visit Type:  Follow-up Primary Provider:  Dr Earl Gala   History of Present Illness: [name] returns today for followup.  He is an 77 yo man with a h/o atrial fibrillation, ICM, CHF, and CHB.  He is s/p BiV ICD implant. When I saw him several weeks ago, I thought a strategy of pursuing NSR was best because he was more symptomatic with his atrial fibrillation with worsening CHF.  He was placed on amiodarone in low dose.  He returns today for followup.  He is stable but still has trouble climbing up any grade.  No c/p or peripheral edema.  Current Medications (verified): 1)  Coreg 3.125 Mg  Tabs (Carvedilol) .... Take 1 Tablet By Mouth Two Times A Day 2)  Synthroid 112 Mcg  Tabs (Levothyroxine Sodium) .... Take 1 Tablet By Mouth Once A Day 3)  Simvastatin 20 Mg  Tabs (Simvastatin) .... Take 1 Tablet By Mouth Once A Day 4)  Aspirin 81 Mg Tbec (Aspirin) .... Take One Tablet By Mouth Daily 5)  Coumadin 5 Mg Tabs (Warfarin Sodium) .... As Directed 6)  Multivitamins  Tabs (Multiple Vitamin) .... 1/2 Tablet Daily 7)  Amiodarone Hcl 200 Mg Tabs (Amiodarone Hcl) .... Take 1 Tablet By Mouth Once Daily  Allergies (verified): No Known Drug Allergies  Past History:  Past Medical History: Last updated: 09/26/2007 cad htn hypothyroid  Past Surgical History: Last updated: 09/26/2007 cabg automatic cardiac defibrillator  Review of Systems  The patient denies chest pain, syncope, dyspnea on exertion, and peripheral edema.    Vital Signs:  Patient profile:   77 year old male Height:      72 inches Weight:      180 pounds Pulse rate:   64 / minute BP sitting:   100 / 72  (left arm)  Vitals Entered By: Laurance Flatten CMA (September 10, 2009 9:41 AM)  Physical Exam  General:  Well developed, well nourished, in no acute distress. Head:  normocephalic and atraumatic Eyes:  PERRLA/EOM intact; conjunctiva and lids normal. Mouth:  Teeth, gums and palate  normal. Oral mucosa normal. Neck:  Neck supple, no JVD. No masses, thyromegaly or abnormal cervical nodes. Chest Wall:  Well healed ICD incision. Lungs:  Clear bilaterally to auscultation without wheezes, rales or rhonchi. Heart:  RRR with normal S1 and S2.  PMI is enalrged and laterally displaced.  No murmurs. Abdomen:  Bowel sounds positive; abdomen soft and non-tender without masses, organomegaly, or hernias noted. No hepatosplenomegaly. Msk:  Back normal, normal gait. Muscle strength and tone normal. Pulses:  pulses normal in all 4 extremities Extremities:  No clubbing or cyanosis.  Some echymossis from mild venous edema.  Excellent pulses. Neurologic:  Alert and oriented x 3.  No deficits grossly.    ICD Specifications Following MD:  Lewayne Bunting, MD     ICD Vendor:  [facility] [employer]     ICD Model Number:  H170     ICD Serial Number:  [id number] ICD DOI:  01/31/2006     ICD Implanting MD:  Lewayne Bunting, MD  Lead 1:    Location: RA     DOI: 01/31/2006     Model #: 8119     Serial #: [id number]     Status: active Lead 2:    Location: RV     DOI: 01/31/2006     Model #: 5621     Serial #: [id number]  Status: active Lead 3:    Location: LV     DOI: 01/31/2006     Model #: [id number]     Serial #: [id number]     Status: active  Indications::  ICM, CHF   ICD Follow Up Remote Check?  No Battery Voltage:  2.64 V     Charge Time:  10.2 seconds     Battery Est. Longevity:  MOL2 Underlying rhythm:  A-fib/dependent ICD Dependent:  Yes       ICD Device Measurements Atrium:  Amplitude: 1.6 mV, Impedance: 584 ohms,  Right Ventricle:  Impedance: 518 ohms, Threshold: 0.8 V at 0.4 msec Left Ventricle:  Impedance: 772 ohms, Threshold: 0.8 V at 0.4 msec Shock Impedance: 40 ohms   Episodes MS Episodes:  1     Percent Mode Switch:  100%     Coumadin:  Yes Shock:  0     ATP:  0     Nonsustained:  0     Ventricular Pacing:  100%  Brady Parameters Mode DDDR     Lower Rate Limit:  40     Upper Rate Limit  140 PAV 130      Tachy Zones VF:  210     VT:  180     Next Remote Date:  12/09/2009     Next Cardiology Appt Due:  09/04/2010 Tech Comments:  Reprogrammed to DDDR.  Rhythm today A-fib, ventricular rates controlled, + coumadin.  Latitude transmissions every 3 months.  ROV 1 year with Dr. Ladona Ridgel. Altha Harm, LPN  September 10, 2009 10:03 AM  MD Comments:  Agree with above.  Impression & Recommendations:  Problem # 1:  ATRIAL FIBRILLATION (ICD-427.31) He remains in atrial fibrillation despite low dose amiodarone.  I will schedule him to come in next week for DCCV.  We will check his INR today. His updated medication list for this problem includes:    Coreg 3.125 Mg Tabs (Carvedilol) .Marland Kitchen... Take 1 tablet by mouth two times a day    Aspirin 81 Mg Tbec (Aspirin) .Marland Kitchen... Take one tablet by mouth daily    Coumadin 5 Mg Tabs (Warfarin sodium) .Marland Kitchen... As directed    Amiodarone Hcl 200 Mg Tabs (Amiodarone hcl) .Marland Kitchen... Take 1 tablet by mouth once daily  Problem # 2:  AUTOMATIC IMPLANTABLE CARDIAC DEFIBRILLATOR SITU (ICD-V45.02) His device is working normally.  Will recheck in several months.  Problem # 3:  CHRONIC SYSTOLIC HEART FAILURE (ICD-428.22) His symptoms remain class 2-3.  A low sodium diet is recommended.  Hopefully with return of NSR his CHF will improve. His updated medication list for this problem includes:    Coreg 3.125 Mg Tabs (Carvedilol) .Marland Kitchen... Take 1 tablet by mouth two times a day    Aspirin 81 Mg Tbec (Aspirin) .Marland Kitchen... Take one tablet by mouth daily    Coumadin 5 Mg Tabs (Warfarin sodium) .Marland Kitchen... As directed    Amiodarone Hcl 200 Mg Tabs (Amiodarone hcl) .Marland Kitchen... Take 1 tablet by mouth once daily

## 2010-10-06 NOTE — Assessment & Plan Note (Signed)
Summary: 3wk f/u    Visit Type:  Follow-up Primary Provider:  Dr Earl Gala  CC:  No complains.  History of Present Illness: Doing well.  Feels fine.  No chest pain.  Tolerating pravachol without much difficulty.    Current Medications (verified): 1)  Coreg 3.125 Mg  Tabs (Carvedilol) .... Take 1 Tablet By Mouth Once A Day 2)  Synthroid 112 Mcg  Tabs (Levothyroxine Sodium) .... Take 1 Tablet By Mouth Once A Day 3)  Aspirin 81 Mg Tbec (Aspirin) .... Take One Tablet By Mouth Daily 4)  Coumadin 5 Mg Tabs (Warfarin Sodium) .... As Directed 2.5mg  Daily Inr On 10-07-09   3.7 5)  Multivitamins  Tabs (Multiple Vitamin) .... 1/2 Tablet Daily 6)  Amiodarone Hcl 200 Mg Tabs (Amiodarone Hcl) .... Take 1 Tablet By Mouth Once Daily 7)  Pravastatin Sodium 40 Mg Tabs (Pravastatin Sodium) .... Take One Tablet By Mouth Once Daily  Allergies (verified): No Known Drug Allergies  Vital Signs:  Patient profile:   77 year old male Height:      72 inches Weight:      182.75 pounds Pulse rate:   52 / minute Pulse rhythm:   irregular Resp:     18 per minute  Vitals Entered By: Vikki Ports (November 01, 2009 9:28 AM)  Physical Exam  General:  Well developed, well nourished, in no acute distress. Head:  normocephalic and atraumatic Lungs:  Clear bilaterally to auscultation and percussion. Heart:  Non-displaced PMI, chest non-tender; regular rate and rhythm, S1, S2 without murmurs, rubs or gallops. Carotid upstroke normal, no bruit. Abdomen:  Bowel sounds positive; abdomen soft and non-tender without masses, organomegaly, or hernias noted. No hepatosplenomegaly. Extremities:  No clubbing or cyanosis. Neurologic:  Alert and oriented x 3.   EKG  Procedure date:  11/01/2009  Findings:      Atrial tracking.  V pacing. Rate 53.     ICD Specifications Following MD:  Lewayne Bunting, MD     ICD Vendor:  [facility] [employer]     ICD Model Number:  H170     ICD Serial Number:  [id number] ICD DOI:  01/31/2006      ICD Implanting MD:  Lewayne Bunting, MD  Lead 1:    Location: RA     DOI: 01/31/2006     Model #: 4087     Serial #: [id number]     Status: active Lead 2:    Location: RV     DOI: 01/31/2006     Model #: [id number]     Serial #: [id number]     Status: active Lead 3:    Location: LV     DOI: 01/31/2006     Model #: [id number]     Serial #: [id number]     Status: active  Indications::  ICM, CHF   ICD Follow Up ICD Dependent:  Yes      Episodes Coumadin:  Yes  Brady Parameters Mode DDDR     Lower Rate Limit:  40     Upper Rate Limit 140 PAV 130      Tachy Zones VF:  210     VT:  180     Impression & Recommendations:  Problem # 1:  BRADYCARDIA (ICD-427.89) Will continue same meds. reprogrammed to high baseline rate at 60bpm, atrial pacing.  Completed.  Discussed rate change. Orders: EKG w/ Interpretation (93000)  His updated medication list for this problem includes:    Coreg 3.125 Mg Tabs (Carvedilol) .Marland KitchenMarland KitchenMarland KitchenMarland Kitchen  Take 1 tablet by mouth once a day    Aspirin 81 Mg Tbec (Aspirin) .Marland Kitchen... Take one tablet by mouth daily    Coumadin 5 Mg Tabs (Warfarin sodium) .Marland Kitchen... As directed 2.5mg  daily inr on 10-07-09   3.7    Amiodarone Hcl 200 Mg Tabs (Amiodarone hcl) .Marland Kitchen... Take 1 tablet by mouth once daily  Problem # 2:  HYPERCHOLESTEROLEMIA  IIA (ICD-272.0) Now on pravachol.  Recheck lipid and liver in six weeks time. His updated medication list for this problem includes:    Pravastatin Sodium 40 Mg Tabs (Pravastatin sodium) .Marland Kitchen... Take one tablet by mouth once daily  Orders: EKG w/ Interpretation (93000)  Problem # 3:  CARDIOMYOPATHY, ISCHEMIC (ICD-414.8) Doing well overall.  Continue carvedilol His updated medication list for this problem includes:    Coreg 3.125 Mg Tabs (Carvedilol) .Marland Kitchen... Take 1 tablet by mouth once a day    Aspirin 81 Mg Tbec (Aspirin) .Marland Kitchen... Take one tablet by mouth daily    Coumadin 5 Mg Tabs (Warfarin sodium) .Marland Kitchen... As directed 2.5mg  daily inr on 10-07-09   3.7    Amiodarone Hcl 200 Mg Tabs (Amiodarone  hcl) .Marland Kitchen... Take 1 tablet by mouth once daily  Problem # 4:  ATRIAL FIBRILLATION (ICD-427.31) Rate controlled.  Not in fib.  Amio working.  labs done.  Monitor closely.   His updated medication list for this problem includes:    Coreg 3.125 Mg Tabs (Carvedilol) .Marland Kitchen... Take 1 tablet by mouth once a day    Aspirin 81 Mg Tbec (Aspirin) .Marland Kitchen... Take one tablet by mouth daily    Coumadin 5 Mg Tabs (Warfarin sodium) .Marland Kitchen... As directed 2.5mg  daily inr on 10-07-09   3.7    Amiodarone Hcl 200 Mg Tabs (Amiodarone hcl) .Marland Kitchen... Take 1 tablet by mouth once daily  Orders: EKG w/ Interpretation (93000)  Patient Instructions: 1)  Your physician recommends that you schedule a follow-up appointment in: 2 MONTHS 2)  Your physician recommends that you return for a FASTING LIPID and LIVER Profile in 6 WEEKS (414.04, 272.0, 428.22)

## 2010-10-06 NOTE — Progress Notes (Signed)
Summary: Medication Question  Phone Note Call from Patient   Caller: Patient Summary of Call: pt states that he is now taking Furosemide / wants to know if he will need to come any earlier to have his PT/INR checked/tg Initial call taken by: Raechel Ache [facility],  February 02, 2010 10:21 AM  Follow-up for Phone Call        Called pt.  Has been on furosemide since 5/24.  Has lost 10 lbs.  Appt made for pt to come in tomorrow 02/03/10 for INR check. Follow-up by: Vashti Hey RN,  February 02, 2010 11:57 AM

## 2010-10-06 NOTE — Assessment & Plan Note (Signed)
Summary: ROV CHEST DISCOMFORT   Referring Provider:  Dr. Laverle Hobby Primary Provider:  Dr Earl Gala   History of Present Illness: [name] was seen today in the office at his request for evaluation and treatment of a upper respiratory infection.  Appointment was scheduled with me due to a misunderstanding on the part of my staff, who thought that he was complaining of chest discomfort.  He describes typical URI symptoms with an initial pharyngitis, which is now resolved, followed by rhinorrhea, which is also markedly improved, followed by chest congestion, cough and benign-appearing sputum production.  At present, he feels substantially better with only minor cough and sputum production persisting.  He has an expectorant or mucolytic at home which was initially prescribed for his wife, but is unaware of the name of that compound.  From a cardiac standpoint, he did not see Dr. Riley Kill following his appointment with me as planned.  He has some difficulty getting to [facility], and only sees Dr. Earl Gala on occasion.  He remains remarkably active and denies dyspnea, orthopnea, PND, pedal edema, lightheadedness or syncope.  He continues to attend device clinic with Dr. Ladona Ridgel for evaluation of his AICD.  Patient subsequently called reporting that the medication he had available to him was in guaifenesin.  Acetylcysteine will likely be more effective than was prescribed at a dose of 300 mg b.i.d. until sputum production resolves.  Current Medications (verified): 1)  Coreg 3.125 Mg  Tabs (Carvedilol) .... Take 1 Tablet By Mouth Once A Day 2)  Synthroid 112 Mcg  Tabs (Levothyroxine Sodium) .... Take 1 Tablet By Mouth Once A Day 3)  Aspirin 81 Mg Tbec (Aspirin) .... Take One Tablet By Mouth Daily 4)  Warfarin Sodium 2 Mg Tabs (Warfarin Sodium) .... Use As Directed By Anticoagualtion Clinic 5)  Multivitamins  Tabs (Multiple Vitamin) .... 1/2 Tablet Daily 6)  Furosemide 40 Mg Tabs (Furosemide)  .... Take 1 Tablet By Mouth Once Daily 7)  Pravastatin Sodium 40 Mg Tabs (Pravastatin Sodium) .... Take 1 Tab Daily  Allergies (verified): No Known Drug Allergies  Comments:  Nurse/Medical Assistant: patient states all meds are the same from previous ov patients pharmacy is [facility] in [place] didn't bring meds or list reviewed from previous ov  Past History:  PMH, FH, and Social History reviewed and updated.  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  Vital Signs:  Patient profile:   77 year old male Weight:      176 pounds BMI:     23.96 O2 Sat:      97 % on Room air Pulse rate:   66 / minute BP sitting:   118 / 59  (left arm)  Vitals Entered By: Dreama Saa, CNA (August 31, 2010 11:41 AM)  O2 Flow:  Room air  Physical Exam  General:    Proportionate weight and height; well developed; no acute distress:   Neck-noJVD; no carotid bruits: Weight-176, 7 pounds less than at his last visit 7 months ago Lungs-No tachypnea,few left bbibasilar rhonchi; no wheezes; AICD in the left subclavian region  Cardiovascular-enlarged and laterally displaced PMI; normal S1 and S2; grade 2/6 holosystolic apical murmur; prominent fourth heart sound Abdomen-BS normal; soft and non-tender without masses or organomegaly:  Musculoskeletal-No deformities, no cyanosis or clubbing: Neurologic-Normal cranial nerves; symmetric strength and tone:  Skin-Warm, Xeroderma over the lower extremities Extremities-Nl distal pulses except  for a decreased left dorsalis pedis; 1/2+ edema:      ICD Specifications Following MD:  Lewayne Bunting, MD     Referring MD:  Riley Kill ICD Vendor:  [employer]     ICD Model Number:  H170     ICD Serial Number:  [id number] ICD DOI:  01/31/2006     ICD Implanting MD:  Lewayne Bunting, MD Research Study Name MADIT CRT  Lead 1:    Location: RA     DOI: 01/31/2006     Model #: [id number]     Serial #: [id number]     Status: active Lead 2:    Location: RV     DOI: 01/31/2006     Model #: [id number]     Serial #: [id number]     Status: active Lead 3:    Location: LV     DOI: 01/31/2006     Model #: [id number]     Serial #: [id number]     Status: active  Indications::  ICM, CHF   ICD Follow Up ICD Dependent:  Yes       ICD Device Measurements Configuration: LV TIP TO RV COIL  Episodes Coumadin:  Yes  Brady Parameters Mode DDDR     Lower Rate Limit:  60     Upper Rate Limit 140 PAV 130      Tachy Zones VF:  210     VT:  180     Impression & Recommendations:  Problem # 1:  ACUTE BRONCHITIS (ICD-466.0) Patient is recovering well from an uncomplicated upper respiratory infection without any apparent adverse effects on his well compensated CHF.  I've recommended that he call with the name of his wife's medication.  If this is a mucolytic, he will take it for a few days.  If not, I will prescribe acetylcysteine 300 mg b.i.d.  Problem # 2:  CHRONIC SYSTOLIC HEART FAILURE (ICD-428.22) Patient is doing well with current medication.  A chemistry profile and BNP level will be obtained.  Problem # 3:  ATRIAL FIBRILLATION (ICD-427.31) Anticoagulation has been maintained in the therapeutic range.  A CBC and stool for Hemoccult testing will be obtained to exclude occult GI blood loss.  Review of his most recent EP assessment reveals no mode switches and thus no evidence for recurrent atrial fibrillation.  If he continues to show no atrial arrhythmias, consideration will be given to discontinuation of chronic  anticoagulation.  Problem # 4:  HYPERCHOLESTEROLEMIA  IIA (ICD-272.0) Lipid profile will be reassessed.  Problem # 5:  ATHEROSCLEROTIC CV DISEASE-S/P CABG (ICD-429.2) He has done extremely well since CABG surgery.  Treatment with aspirin and optimal management of cardiovascular risk factors will be continued.  I will plan to see this very nice gentleman again in 6 months.  Other Orders: T-Lipid Profile [phone]) T-CBC w/Diff [phone]) T-Comprehensive Metabolic Panel [phone]) T-BNP  (B Natriuretic Peptide) ([id number]) T-Chest x-ray, 2 views (71020) Hemoccult Cards (Take Home) (Hemoccult Cards)  Patient Instructions: 1)  Your physician recommends that you schedule a follow-up appointment in: 6 months 2)  Your physician recommends that you return for lab work in: today 3)  A chest x-ray takes a picture of  the organs and structures inside the chest, including the heart, lungs, and blood vessels. This test can show several things, including, whether the heart is enlarged; whether fluid is building up in the lungs; and whether pacemaker / defibrillator leads are still in place. 4)  Your physician has asked that you test your stool for blood. It is necessary to test 3 different stool specimens for accuracy. You will be given 3 hemoccult cards for specimen collection. For each stool specimen, place a small portion of stool sample (from 2 different areas of the stool) into the 2 squares on the card. Close card. Repeat with 2 more stool specimens. Bring the cards back to the office for testing. Prescriptions: NAC 600 600 MG CAPS (ACETYLCYSTEINE) TAKE 1/2 TABLET TWICE DAILY  AS NEEDED FOR CONGESTION  #30 x 0   Entered by:   Fuller Plan CMA   Authorized by:   Kathlen Brunswick, MD, [facility]   Signed by:   Fuller Plan CMA on 08/31/2010   Method used:   Electronically to        [facility]  [address] (retail)       [address]       [place], [place]  [address]        Ph: [phone]       Fax: [phone]   RxID:   [id number]   Appended Document: Orders Update    Clinical Lists Changes  Medications: Changed medication from NAC 600 600 MG CAPS (ACETYLCYSTEINE) TAKE 1/2 TABLET TWICE DAILY  AS NEEDED FOR CONGESTION to ACETADOTE 200 MG/ML SOLN (ACETYLCYSTEINE) use 1ml two times a day via nebulizer - Signed Rx of ACETADOTE 200 MG/ML SOLN (ACETYLCYSTEINE) use 1ml two times a day via nebulizer;  #60 x 0;  Signed;  Entered by: Teressa Lower RN;  Authorized by: Kathlen Brunswick, MD, [facility];  Method used: Electronically to [facility]*, [address] [place], [place], [place]  [address], Ph: [phone], Fax: [phone] Orders: Added new Test order of Durable Medical Equipment (DME) - Signed    Prescriptions: ACETADOTE 200 MG/ML SOLN (ACETYLCYSTEINE) use 1ml two times a day via nebulizer  #60 x 0   Entered by:   Teressa Lower RN   Authorized by:   Kathlen Brunswick, MD, [facility]   Signed by:   Teressa Lower RN on 09/02/2010   Method used:   Electronically to        [facility]* (retail)       [address]       [place], [place]  [address]       Ph: [phone]       Fax: [phone]   RxID:   [id number]

## 2010-10-06 NOTE — Letter (Signed)
Summary: [place] Results Engineer, agricultural at [facility]  [address], [place] [address]   Phone: [phone]  Fax: [phone]      May 30, 2010 MRN: [id number]   LEMUEL BOODRAM [address] [place], [place]  [address]   Dear Mr. Mairena,  Your test ordered by Selena Batten has been reviewed by your physician (or physician assistant) and was found to be normal or stable. Your physician (or physician assistant) felt no changes were needed at this time.  ____ Echocardiogram  ____ Cardiac Stress Test  __x__ Lab Work  ____ Peripheral vascular study of arms, legs or neck  ____ CT scan or X-ray  ____ Lung or Breathing test  ____ Other:  No change in medical treatment at this time, per Dr. Dietrich Pates.  Enclosed is a copy of your labwork for your records.  Thank you, Tammy Allyne Gee RN    [place] Bing, MD, [facility].C.Gaylord Shih, MD, F.A.C.C Lewayne Bunting, MD, F.A.C.C Nona Dell, MD, F.A.C.C Charlton Haws, MD, [facility].C.C

## 2010-10-06 NOTE — Medication Information (Signed)
Summary: ccr-lr  Anticoagulant Therapy  Managed by: Vashti Hey, RN Referring MD: Riley Kill PCP: Dr Leighton Ruff MD: Dietrich Pates MD, Molly Maduro Indication 1: Atrial Fibrillation (ICD-427.31) Lab Used: [facility] [place] Site: [place] INR POC 3.5  Dietary changes: no    Health status changes: no    Bleeding/hemorrhagic complications: no    Recent/future hospitalizations: no    Any changes in medication regimen? no    Recent/future dental: no  Any missed doses?: no       Is patient compliant with meds? yes       Allergies: No Known Drug Allergies  Anticoagulation Management History:      The patient is taking warfarin and comes in today for a routine follow up visit.  Positive risk factors for bleeding include an age of 75 years or older.  The bleeding index is 'intermediate risk'.  Positive CHADS2 values include History of CHF and Age > 42 years old.  The start date was 06/16/2006.  His last INR was 3.49.  Anticoagulation responsible provider: Dietrich Pates MD, Molly Maduro.  INR POC: 3.5.  Cuvette Lot#: [id number].  Exp: 10/11.    Anticoagulation Management Assessment/Plan:      The patient's current anticoagulation dose is Coumadin 5 mg tabs: as directed.  The target INR is 2 - 3.  The next INR is due 10/14/2009.  Anticoagulation instructions were given to patient.  Results were reviewed/authorized by Vashti Hey, RN.  He was notified by Vashti Hey RN.         Prior Anticoagulation Instructions: INR 3.7 Hold couamdin tonight then resume 2.5mg  once daily   Current Anticoagulation Instructions: INR 3.5 Decrease coumadin to 2.5mg  once daily except none on Thursdays

## 2010-10-06 NOTE — Assessment & Plan Note (Signed)
Summary: ROV   Visit Type:  Follow-up Primary Provider:  Dr Earl Gala  CC:  Wheezing and congestion for about 3-4 days.  History of Present Illness: started coughing and wheezing on Saturday, and bringing up greenish, yellow sputum.  Denies fever or other symptoms.    Current Medications (verified): 1)  Coreg 3.125 Mg  Tabs (Carvedilol) .... Take 1 Tablet By Mouth Once A Day 2)  Synthroid 112 Mcg  Tabs (Levothyroxine Sodium) .... Take 1 Tablet By Mouth Once A Day 3)  Aspirin 81 Mg Tbec (Aspirin) .... Take One Tablet By Mouth Daily 4)  Warfarin Sodium 2 Mg Tabs (Warfarin Sodium) .... Use As Directed By Anticoagualtion Clinic 5)  Multivitamins  Tabs (Multiple Vitamin) .... 1/2 Tablet Daily 6)  Amiodarone Hcl 200 Mg Tabs (Amiodarone Hcl) .... Take 1 Tablet By Mouth Once Daily 7)  Pravastatin Sodium 40 Mg Tabs (Pravastatin Sodium) .... Take One Tablet By Mouth Once Daily  Allergies (verified): No Known Drug Allergies  Vital Signs:  Patient profile:   77 year old male Height:      72 inches Weight:      183 pounds Pulse rate:   60 / minute Pulse rhythm:   regular Resp:     18 per minute BP sitting:   136 / 81  (left arm) Cuff size:   large  Vitals Entered By: Vikki Ports (December 13, 2009 9:20 AM)  Physical Exam  General:  Well developed, well nourished, in no acute distress. Head:  normocephalic and atraumatic Eyes:  PERRLA/EOM intact; conjunctiva and lids normal. Neck:  No sig JVD.   Lungs:  No rales.  No expiratory wheezes.   Heart:  Prom S4. gallop.  Normal S1 and S2.   Abdomen:  Bowel sounds positive; abdomen soft and non-tender without masses, organomegaly, or hernias noted. No hepatosplenomegaly. Extremities:  No clubbing or cyanosis. Neurologic:  Alert and oriented x 3.   EKG  Procedure date:  12/13/2009  Findings:      Atrial tracking, and av pacing both possibly.   CXR  Procedure date:  12/13/2009  Findings:      DG CHEST 2 VIEW - [id number]   Clinical  Data: Coronary artery disease.  Acute bronchitis.   CHEST - 2 VIEW   Comparison: 09/10/2009   Findings: Mid thoracic spondylosis. Pacer / AICD device.  No lead discontinuity. Prior median sternotomy. Midline trachea.  Mild cardiomegaly.  Tortuous thoracic aorta.  Small left pleural effusion is new and blunts the left costophrenic angle.   Biapical pleural thickening. No pneumothorax.  Diffuse interstitial thickening is similar and likely relates to peribronchial thickening/chronic bronchitis.   IMPRESSION:   1.  New blunting of the left costophrenic angle likely relates to trace left pleural fluid. 2.  Otherwise similar cardiomegaly and chronic interstitial thickening without acute superimposed process.   Read By:  Consuello Bossier,  M.D.     Released By:  Consuello Bossier,  M.D.  _____________________________________________________________________  External Attachment:    Type:     Image     Comment:  DG CHEST 2 VIEW - [id number]  Signed by Ronaldo Miyamoto, MD, [facility] on 12/13/2009 at 6:17 PM  ________________________________________________________________________ Let patient know this is ok.  Effusion is new.  Will continue to monitor at next OV.     Signed by Ronaldo Miyamoto, MD, [facility] on 12/13/2009 at 6:17 PM   ICD Specifications Following MD:  Lewayne Bunting, MD     Referring MD:  Dartanian Knaggs ICD Vendor:  [employer]     ICD Model Number:  H170     ICD Serial Number:  [id number] ICD DOI:  01/31/2006     ICD Implanting MD:  Lewayne Bunting, MD  Lead 1:    Location: RA     DOI: 01/31/2006     Model #: 1610     Serial #: [id number]     Status: active Lead 2:    Location: RV     DOI: 01/31/2006     Model #: [id number]     Serial #: [id number]     Status: active Lead 3:    Location: LV     DOI: 01/31/2006     Model #: 1058T     Serial #: [id number]     Status: active  Indications::  ICM, CHF   ICD Follow Up ICD Dependent:  Yes       ICD Device Measurements Configuration: LV TIP  TO LV RING  Episodes Coumadin:  Yes  Brady Parameters Mode DDDR     Lower Rate Limit:  60     Upper Rate Limit 140 PAV 130      Tachy Zones VF:  210     VT:  180     Impression & Recommendations:  Problem # 1:  ACUTE BRONCHITIS (ICD-466.0)  No fever or rales.  Will get CXR because of AMIO. No JVD or swelling.  Abx choices limited because of Amio and warfarin.  Will try one week course of Ceftin.    His updated medication list for this problem includes:    Ceftin 250 Mg Tabs (Cefuroxime axetil) .Marland Kitchen... Take one tablet by mouth twice a day for 7 days  Orders: EKG w/ Interpretation (93000) T-2 View CXR (71020TC)  Problem # 2:  CHRONIC SYSTOLIC HEART FAILURE (ICD-428.22) Hemodynamically stable. His updated medication list for this problem includes:    Coreg 3.125 Mg Tabs (Carvedilol) .Marland Kitchen... Take 1 tablet by mouth once a day    Aspirin 81 Mg Tbec (Aspirin) .Marland Kitchen... Take one tablet by mouth daily    Warfarin Sodium 2 Mg Tabs (Warfarin sodium) ..... Use as directed by anticoagualtion clinic    Amiodarone Hcl 200 Mg Tabs (Amiodarone hcl) .Marland Kitchen... Take 1 tablet by mouth once daily  Problem # 3:  ATRIAL FIBRILLATION (ICD-427.31)  NSR at present.   His updated medication list for this problem includes:    Coreg 3.125 Mg Tabs (Carvedilol) .Marland Kitchen... Take 1 tablet by mouth once a day    Aspirin 81 Mg Tbec (Aspirin) .Marland Kitchen... Take one tablet by mouth daily    Warfarin Sodium 2 Mg Tabs (Warfarin sodium) ..... Use as directed by anticoagualtion clinic    Amiodarone Hcl 200 Mg Tabs (Amiodarone hcl) .Marland Kitchen... Take 1 tablet by mouth once daily  Orders: EKG w/ Interpretation (93000) T-2 View CXR (71020TC)  Problem # 4:  CARDIOMYOPATHY, ISCHEMIC (ICD-414.8) stable. His updated medication list for this problem includes:    Coreg 3.125 Mg Tabs (Carvedilol) .Marland Kitchen... Take 1 tablet by mouth once a day    Aspirin 81 Mg Tbec (Aspirin) .Marland Kitchen... Take one tablet by mouth daily    Warfarin Sodium 2 Mg Tabs (Warfarin sodium)  ..... Use as directed by anticoagualtion clinic    Amiodarone Hcl 200 Mg Tabs (Amiodarone hcl) .Marland Kitchen... Take 1 tablet by mouth once daily  Patient Instructions: 1)  Chest x-ray obtained in the office today.  2)  Your physician recommends that you schedule a follow-up appointment  in: 6 WEEKS 3)  Your physician has recommended you make the following change in your medication: START Ceftin 250mg  one tablet by mouth twice a day for 7 days Prescriptions: CEFTIN 250 MG TABS (CEFUROXIME AXETIL) take one tablet by mouth twice a day for 7 days  #14 x 0   Entered by:   Julieta Gutting, RN, BSN   Authorized by:   Ronaldo Miyamoto, MD, [facility]   Signed by:   Julieta Gutting, RN, BSN on 12/13/2009   Method used:   Electronically to        [facility]  [address] (retail)       [address]       [place], [place]  [address]       Ph: [phone]       Fax: [phone]   RxID:   [id number]

## 2010-10-06 NOTE — Medication Information (Signed)
Summary: ccr-lr  Anticoagulant Therapy  Managed by: Vashti Hey, RN Referring MD: Riley Kill PCP: Dr Leighton Ruff MD: Dietrich Pates MD, Molly Maduro Indication 1: Atrial Fibrillation (ICD-427.31) Lab Used: [facility] [place] Site: [place] INR POC 1.9  Dietary changes: no    Health status changes: no    Bleeding/hemorrhagic complications: no    Recent/future hospitalizations: no    Any changes in medication regimen? no    Recent/future dental: no  Any missed doses?: no       Is patient compliant with meds? yes       Allergies: No Known Drug Allergies  Anticoagulation Management History:      The patient is taking warfarin and comes in today for a routine follow up visit.  Positive risk factors for bleeding include an age of 18 years or older.  The bleeding index is 'intermediate risk'.  Positive CHADS2 values include History of CHF and Age > 57 years old.  The start date was 06/16/2006.  His last INR was 3.49.  Anticoagulation responsible provider: Dietrich Pates MD, Molly Maduro.  INR POC: 1.9.  Cuvette Lot#: [id number].  Exp: 10/11.    Anticoagulation Management Assessment/Plan:      The patient's current anticoagulation dose is Warfarin sodium 2 mg tabs: Use as directed by Anticoagualtion Clinic.  The target INR is 2 - 3.  The next INR is due 12/30/2009.  Anticoagulation instructions were given to patient.  Results were reviewed/authorized by Vashti Hey, RN.  He was notified by Vashti Hey RN.         Prior Anticoagulation Instructions: INR 4.2 Hold coumadin tonight then decrease dose to 2.5mg  once daily except none on Mondays, Wednesdays and Fridays  Current Anticoagulation Instructions: INR 1.9 Change warfarin to 2mg  tablet Take coumadin 2mg  once daily except 1mg  on Mondays, Wednesdays and Fridays Prescriptions: WARFARIN SODIUM 2 MG TABS (WARFARIN SODIUM) Use as directed by Anticoagualtion Clinic  #90 x 3   Entered by:   Vashti Hey RN   Authorized by:    Kathlen Brunswick, MD, [facility]   Signed by:   Vashti Hey RN on 12/09/2009   Method used:   Electronically to        [facility]  [address] (retail)       [address]       [place], [place]  [address]       Ph: [phone]       Fax: [phone]   RxID:   [id number]

## 2010-10-06 NOTE — Letter (Signed)
Summary: [place] Results Engineer, agricultural at [facility]  [address] [place] [address]   Phone: [phone]  Fax: [phone]      February 03, 2010 MRN: [id number]   [name] [address] [place], [place]  [address]   Dear [name],  Your test ordered by Selena Batten has been reviewed by your physician (or physician assistant) and was found to be normal or stable. Your physician (or physician assistant) felt no changes were needed at this time.  ____ Echocardiogram  ____ Cardiac Stress Test  __x__ Lab Work  ____ Peripheral vascular study of arms, legs or neck  ____ CT scan or X-ray  ____ Lung or Breathing test  ____ Other:  No change in medical treatment at this time, per Dr. Dietrich Pates.  Enclosed is a copy of your labwork for your records.  Please repeat labwork in 2 months.  I will mail the lab slip to your address when it is due to be drawn.  Thank you, [name] Allyne Gee RN    [place] Bing, MD, [facility].C.Gaylord Shih, MD, F.A.C.C Lewayne Bunting, MD, F.A.C.C Nona Dell, MD, F.A.C.C Charlton Haws, MD, [facility] Arena.C.C

## 2010-10-06 NOTE — Medication Information (Signed)
Summary: ccr-lr  Anticoagulant Therapy  Managed by: Vashti Hey, RN Referring MD: Riley Kill PCP: Dr Leighton Ruff MD: Dietrich Pates MD, Molly Maduro Indication 1: Atrial Fibrillation (ICD-427.31) Lab Used: [facility]  Site: [place] INR POC 2.9  Dietary changes: no    Health status changes: no    Bleeding/hemorrhagic complications: no    Recent/future hospitalizations: no    Any changes in medication regimen? no    Recent/future dental: no  Any missed doses?: no       Is patient compliant with meds? yes       Allergies: No Known Drug Allergies  Anticoagulation Management History:      The patient is taking warfarin and comes in today for a routine follow up visit.  Positive risk factors for bleeding include an age of 75 years or older.  The bleeding index is 'intermediate risk'.  Positive CHADS2 values include History of CHF and Age > 4 years old.  The start date was 06/16/2006.  His last INR was 3.49.  Anticoagulation responsible provider: Dietrich Pates MD, Molly Maduro.  INR POC: 2.9.  Cuvette Lot#: [id number].  Exp: 10/11.    Anticoagulation Management Assessment/Plan:      The patient's current anticoagulation dose is Warfarin sodium 2 mg tabs: Use as directed by Anticoagualtion Clinic.  The target INR is 2 - 3.  The next INR is due 02/17/2010.  Anticoagulation instructions were given to patient.  Results were reviewed/authorized by Vashti Hey, RN.  He was notified by Vashti Hey RN.         Prior Anticoagulation Instructions: INR 1.9 Increase coumadin to 2mg  once daily except 1mg  on Tuesdays and Saturdays  Current Anticoagulation Instructions: INR 2.9 Continue coumadin 2mg  once daily except 1mg  on Tuesdays and Saturdays

## 2010-10-06 NOTE — Assessment & Plan Note (Signed)
Summary: ROV   Visit Type:  Follow-up Referring Provider:  Dr. Laverle Hobby Primary Provider:  Dr Earl Gala   History of Present Illness: [name] was seen in the office at the kind request of Dr. Laverle Hobby for evaluation of exertional weakness in the legs and a nocturnal breathing disturbance.  [name] has a long-standing ischemic cardiomyopathy with severely impaired left ventricular systolic function and substantial mitral regurgitation but no history of clinical congestive heart failure.  He has not been maintained on a diuretic, but has done well, remaining active despite his 87 years and significant medical problems.  Over the past month or 2 he has noted abnormal sounds with respiration and a breathing disturbance at night without clear dyspnea or exertional dyspnea.  He has had mild edema, but no significant increase in weight.  There is been no recent change in his medication.  He was seen by Dr. Riley Kill 5 weeks ago at which time he was thought to have a upper respiratory infection.  A course of Ceftin was prescribed, but symptoms are unchanged.  Patient denies significant sputum production, fever or rigors.  Chest x-ray at that time showed blunting of left costophrenic angle.  Records were obtained from Dr. Wende Crease and reviewed.  Images of both chest x-rays were also obtained and reviewed.  Case was discussed with referring physician.  Current Medications (verified): 1)  Coreg 3.125 Mg  Tabs (Carvedilol) .... Take 1 Tablet By Mouth Once A Day 2)  Synthroid 112 Mcg  Tabs (Levothyroxine Sodium) .... Take 1 Tablet By Mouth Once A Day 3)  Aspirin 81 Mg Tbec (Aspirin) .... Take One Tablet By Mouth Daily 4)  Warfarin Sodium 2 Mg Tabs (Warfarin Sodium) .... Use As Directed By Anticoagualtion Clinic 5)  Multivitamins  Tabs (Multiple Vitamin) .... 1/2 Tablet Daily 6)  Simvastatin 20 Mg Tabs (Simvastatin) .... Take 1 Tab Daily 7)  Furosemide 40 Mg Tabs (Furosemide) .... Take 1  Tablet By Mouth Once Daily  Allergies (verified): No Known Drug Allergies  Past History:  PMH, FH, and Social History reviewed and updated.  Past Medical History: ASCVD-status post CABG surgery; ischemic cardiomyopathy with an EF of 25%; status post AICD implantation Mitral regurgitation Hypertension Hypothyroid  EKG  Procedure date:  01/20/2010  Findings:      Normal sinus rhythm Atrial synchronous ventricular pacing No tracing for comparison   Review of Systems       See history of present illness.  Vital Signs:  Patient profile:   77 year old male Weight:      183 pounds BMI:     24.91 O2 Sat:      91 % on Room air Temp:     99 degrees F Pulse rate:   61 / minute BP sitting:   117 / 82  (right arm)  Vitals Entered By: Dreama Saa, CNA (Jan 20, 2010 3:30 PM)  O2 Flow:  Room air  Physical Exam  General:    Well developed; no acute distress:   Neck-Mild JVD; no carotid bruits: Weight-183, unchanged from his last visit Lungs-No tachypnea,few bibasilar rales; no rhonchi; no wheezes: Cardiovascular-normal PMI; normal S1 and S2; grade 2-3/6 holosystolic apical murmur; no third heart sound appreciated Abdomen-BS normal; soft and non-tender without masses or organomegaly:  Musculoskeletal-No deformities, no cyanosis or clubbing: Neurologic-Normal cranial nerves; symmetric strength and tone:  Skin-Warm, no significant lesions: Extremities-Nl distal pulses; 1+ edema:      ICD Specifications Following MD:  Lewayne Bunting,  MD     Referring MD:  Riley Kill ICD Vendor:  [employer]     ICD Model Number:  H170     ICD Serial Number:  [id number] ICD DOI:  01/31/2006     ICD Implanting MD:  Lewayne Bunting, MD  Lead 1:    Location: RA     DOI: 01/31/2006     Model #: 1308     Serial #: [id number]     Status: active Lead 2:    Location: RV     DOI: 01/31/2006     Model #: 0158     Serial #: [id number]     Status: active Lead 3:    Location: LV     DOI: 01/31/2006     Model #:  [id number]     Serial #: [id number]     Status: active  Indications::  ICM, CHF   ICD Follow Up ICD Dependent:  Yes       ICD Device Measurements Configuration: LV TIP TO LV RING  Episodes Coumadin:  Yes  Brady Parameters Mode DDDR     Lower Rate Limit:  60     Upper Rate Limit 140 PAV 130      Tachy Zones VF:  210     VT:  180     Impression & Recommendations:  Problem # 1:  CARDIOMYOPATHY, ISCHEMIC (ICD-414.8) [name] presents with symptoms consistent with PND and physical findings consistent with mild congestive heart failure.  His current chest x-ray shows a slight increase in abnormalities of the left base and some new blunting of the right costophrenic angle.  There is upon a possible vague infiltrate at the left base and vascular redistribution.  Overall, his symptoms are most suggestive of mild congestive heart failure.  While pneumonia is not entirely excluded, the presentation would be very atypical.  We will proceed with gentle diuresis as suggested by Dr. Wende Crease.  A CBC, chemistry profile and BNP level are pending.  A followup chemistry profile will be obtained in one week when [name] returns to see Dr. Riley Kill.  Other Orders: Future Orders: T-Comprehensive Metabolic Panel ([id number]) ... 01/25/2010 T-CBC w/Diff ([id number]) ... 01/25/2010 T-BNP  (B Natriuretic Peptide) [id number]) ... 01/25/2010  Patient Instructions: 1)  Your physician recommends that you schedule a follow-up appointment in: next week with Dr. Riley Kill 2)  Your physician recommends that you return for lab work in: 01-25-10 3)  Your physician recommends that you continue on your current medications as directed. Please refer to the Current Medication list given to you today. 4)  Start taking Lasix  as ordered 5)  Your physician recommends that you weigh, daily, at the same time every day, and in the same amount of clothing.  Please record your daily weights on the handout provided and bring it to  your next appointment. 6)  ***please call if you have a weight loss of 5 pounds***

## 2010-10-06 NOTE — Medication Information (Signed)
Summary: ccr-lr  Anticoagulant Therapy  Managed by: Vashti Hey, RN Referring MD: Riley Kill PCP: Dr Leighton Ruff MD: Dietrich Pates MD, Molly Maduro Indication 1: Atrial Fibrillation (ICD-427.31) Lab Used: [facility] [place] Site: [place] INR POC 2.6  Dietary changes: no    Health status changes: no    Bleeding/hemorrhagic complications: no    Recent/future hospitalizations: no    Any changes in medication regimen? no    Recent/future dental: no  Any missed doses?: no       Is patient compliant with meds? yes       Allergies: No Known Drug Allergies  Anticoagulation Management History:      The patient is taking warfarin and comes in today for a routine follow up visit.  Positive risk factors for bleeding include an age of 75 years or older and presence of serious comorbidities.  The bleeding index is 'intermediate risk'.  Positive CHADS2 values include History of CHF and Age > 42 years old.  The start date was 06/16/2006.  His last INR was 3.49.  Anticoagulation responsible provider: Dietrich Pates MD, Molly Maduro.  INR POC: 2.6.  Cuvette Lot#: [id number].  Exp: 10/11.    Anticoagulation Management Assessment/Plan:      The patient's current anticoagulation dose is Warfarin sodium 2 mg tabs: Use as directed by Anticoagualtion Clinic.  The target INR is 2 - 3.  The next INR is due 04/14/2010.  Anticoagulation instructions were given to patient.  Results were reviewed/authorized by Vashti Hey, RN.  He was notified by Vashti Hey RN.         Prior Anticoagulation Instructions: INR 2.5 Continue coumadin 2mg  once daily except 1mg  on Tuesdays and Saturdays  Current Anticoagulation Instructions: INR 2.6 Continue coumadin 2mg  once daily except 1mg  on Tuesdays and Saturdays

## 2010-10-06 NOTE — Cardiovascular Report (Signed)
Summary: Office Visit Remote  Office Visit Remote   Imported By: Roderic Ovens 12/22/2009 14:54:56  _____________________________________________________________________  External Attachment:    Type:   Image     Comment:   External Document

## 2010-10-06 NOTE — Assessment & Plan Note (Signed)
Summary: [facility]   Allergies: No Known Drug Allergies    ICD Specifications Following MD:  Lewayne Bunting, MD     ICD Vendor:  [employer]     ICD Model Number:  H170     ICD Serial Number:  [id number] ICD DOI:  01/31/2006     ICD Implanting MD:  Lewayne Bunting, MD  Lead 1:    Location: RA     DOI: 01/31/2006     Model #: 0454     Serial #: [id number]     Status: active Lead 2:    Location: RV     DOI: 01/31/2006     Model #: 0158     Serial #: [id number]     Status: active Lead 3:    Location: LV     DOI: 01/31/2006     Model #: 1058T     Serial #: [id number]     Status: active  Indications::  ICM, CHF   ICD Follow Up ICD Dependent:  Yes      Episodes Coumadin:  Yes  Brady Parameters Mode DDDR     Lower Rate Limit:  40     Upper Rate Limit 140 PAV 130      Tachy Zones VF:  210     VT:  180     Other Orders: T-2 View CXR (71020TC)  Patient Instructions: 1)  INR today is 3.5. 2)  Your physician has recommended that you have a cardioversion (DCCV).  Electrical cardioversion uses a jolt of electricity to your heart either through paddles or wired patches attached to your chest. This is a controlled, usually prescheduled, procedure. Defibrillation is done under light anesthesia in the hospital, and you usually go home the day of the procedure. This is done to get your heart back into a normal rhythm. You are not awake for the procedure. Please see the instruction sheet given to you today. 3)  Scheduled for Wednesday, 09/22/2009.  Arrive at the [facility] for a 10AM procedure. 4)  Have labwork done at [facility] no earlier than Wednesday, September 15, 2009 and no later than Monday, September 20, 2009.

## 2010-10-06 NOTE — Assessment & Plan Note (Signed)
Summary: f15m   Visit Type:  4 months follow up Primary Provider:  Dr Earl Gala  CC:  No complains.  History of Present Illness: Amiodarone precautions reviewed.  Tolerating well, and no further symptoms.  Feels pretty good.    Current Medications (verified): 1)  Coreg 3.125 Mg  Tabs (Carvedilol) .... Take 1 Tablet By Mouth Once A Day 2)  Synthroid 112 Mcg  Tabs (Levothyroxine Sodium) .... Take 1 Tablet By Mouth Once A Day 3)  Aspirin 81 Mg Tbec (Aspirin) .... Take One Tablet By Mouth Daily 4)  Coumadin 5 Mg Tabs (Warfarin Sodium) .... As Directed 2.5mg  Daily 5)  Multivitamins  Tabs (Multiple Vitamin) .... 1/2 Tablet Daily 6)  Amiodarone Hcl 200 Mg Tabs (Amiodarone Hcl) .... Take 1 Tablet By Mouth Once Daily 7)  Pravastatin Sodium 40 Mg Tabs (Pravastatin Sodium) .... Take One Tablet By Mouth Once Daily  Allergies (verified): No Known Drug Allergies  Vital Signs:  Patient profile:   77 year old male Height:      72 inches Weight:      181.25 pounds Pulse rate:   50 / minute Pulse rhythm:   regular Resp:     18 per minute BP sitting:   124 / 68  (left arm) Cuff size:   large  Vitals Entered By: Vikki Ports (October 12, 2009 10:04 AM)  Physical Exam  General:  Well developed, well nourished, in no acute distress. Lungs:  Clear bilaterally to auscultation and percussion. Heart:  Non-displaced PMI, chest non-tender; regular rate and rhythm, S1, S2 without murmurs, rubs;  S4  gallops. Carotid upstroke normal, no bruit. Normal abdominal aortic size, no bruits. Femorals normal pulses, no bruits. Pedals normal pulses. No edema, no varicosities.   EKG  Procedure date:  10/12/2009  Findings:      Atrial tracking.  V pacing. Severe SB.   ICD Specifications Following MD:  Lewayne Bunting, MD     ICD Vendor:  [facility] [employer]     ICD Model Number:  H170     ICD Serial Number:  [id number] ICD DOI:  01/31/2006     ICD Implanting MD:  Lewayne Bunting, MD  Lead 1:    Location: RA     DOI:  01/31/2006     Model #: 9562     Serial #: [id number]     Status: active Lead 2:    Location: RV     DOI: 01/31/2006     Model #: 0158     Serial #: [id number]     Status: active Lead 3:    Location: LV     DOI: 01/31/2006     Model #: 1058T     Serial #: [id number]     Status: active  Indications::  ICM, CHF   ICD Follow Up ICD Dependent:  Yes      Episodes Coumadin:  Yes  Brady Parameters Mode DDDR     Lower Rate Limit:  40     Upper Rate Limit 140 PAV 130      Tachy Zones VF:  210     VT:  180     Impression & Recommendations:  Problem # 1:  ATRIAL FIBRILLATION (ICD-427.31) Maintaining NSR after DC version, and Amio, but bradycardiac.  Will reduce Coreg dose, see again in two weeks, and reassess.  Check Amio labs. His updated medication list for this problem includes:    Coreg 3.125 Mg Tabs (Carvedilol) .Marland Kitchen... Take 1 tablet by mouth  once a day    Aspirin 81 Mg Tbec (Aspirin) .Marland Kitchen... Take one tablet by mouth daily    Coumadin 5 Mg Tabs (Warfarin sodium) .Marland Kitchen... As directed 2.5mg  daily    Amiodarone Hcl 200 Mg Tabs (Amiodarone hcl) .Marland Kitchen... Take 1 tablet by mouth once daily  Problem # 2:  HYPERCHOLESTEROLEMIA  IIA (ICD-272.0)  Will stop Simva, now on Amio, and change to pravastatin. The following medications were removed from the medication list:    Simvastatin 20 Mg Tabs (Simvastatin) .Marland Kitchen... Take 1 tablet by mouth once a day His updated medication list for this problem includes:    Pravastatin Sodium 40 Mg Tabs (Pravastatin sodium) .Marland Kitchen... Take one tablet by mouth once daily  Orders: TLB-TSH (Thyroid Stimulating Hormone) (84443-TSH) TLB-T4 (Thyrox), Free [phone]) TLB-Hepatic/Liver Function Pnl (80076-HEPATIC)  His updated medication list for this problem includes:    Simvastatin 20 Mg Tabs (Simvastatin) .Marland Kitchen... Take 1 tablet by mouth once a day  Problem # 3:  CHRONIC SYSTOLIC HEART FAILURE (ICD-428.22)  Seems stable at present. His updated medication list for this problem includes:     Coreg 3.125 Mg Tabs (Carvedilol) .Marland Kitchen... Take 1 tablet by mouth once a day    Aspirin 81 Mg Tbec (Aspirin) .Marland Kitchen... Take one tablet by mouth daily    Coumadin 5 Mg Tabs (Warfarin sodium) .Marland Kitchen... As directed 2.5mg  daily    Amiodarone Hcl 200 Mg Tabs (Amiodarone hcl) .Marland Kitchen... Take 1 tablet by mouth once daily  His updated medication list for this problem includes:    Coreg 3.125 Mg Tabs (Carvedilol) .Marland Kitchen... Take 1 tablet by mouth two times a day    Aspirin 81 Mg Tbec (Aspirin) .Marland Kitchen... Take one tablet by mouth daily    Coumadin 5 Mg Tabs (Warfarin sodium) .Marland Kitchen... As directed 2.5mg  daily    Amiodarone Hcl 200 Mg Tabs (Amiodarone hcl) .Marland Kitchen... Take 1 tablet by mouth once daily  Patient Instructions: 1)  Your physician recommends that you schedule a follow-up appointment in: 2 weeks with Dr. Riley Kill. 2)  Your physician recommends that you get lab work today. 3)  Your physician has recommended you make the following change in your medication:  4)  Stop simvastatin. 5)  Start pravastatin 40mg  one tablet daily.  Prescription sent electronically to [facility] in [place]. 6)  Decrease coreg to one tablet daily. Prescriptions: PRAVASTATIN SODIUM 40 MG TABS (PRAVASTATIN SODIUM) take one tablet by mouth once daily  #30 x 12   Entered by:   Minerva Areola, RN, BSN   Authorized by:   Ronaldo Miyamoto, MD, [facility]   Signed by:   Minerva Areola, RN, BSN on 10/12/2009   Method used:   Electronically to        [facility]  [address] (retail)       [address]       [place], [place]  [address]       Ph: [phone]       Fax: [phone]   RxID:   [id number]

## 2010-10-06 NOTE — Letter (Signed)
Summary: Cardioversion/TEE Instructions  [facility], Main Office  [address]   [place], [place] [address]   Phone: [phone]  Fax: [phone]    Cardioversion Instructions  You are scheduled for a Cardioversion on Wednesday, September 22, 2009 with Dr. Ladona Ridgel.   Please arrive at the [facility] at 8:00  a.m. on the day of your procedure.  1)DIET:    Nothing to eat or drink after midnight except your medications with a sip of water.  2)   Go to [facility] for your lab work. You do not have to be fasting.  These may not be drawn any earlier than Wednesday, September 15, 2009 and no later than Monday, September 20, 2009.  3)   MAKE SURE YOU TAKE YOUR COUMADIN.        4)   YOU MAY TAKE ALL of your remaining medications with a small amount of water.  5)  Must have a responsible person to drive you home.  6)   Bring a current list of your medications and current insurance cards.   * Special Note:  Every effort is made to have your procedure done on time. Occasionally there are emergencies that present themselves at the hospital that may cause delays. Please be patient if a delay does occur.  * If you have any questions after you get home, please call the office at [phone].

## 2010-10-06 NOTE — Assessment & Plan Note (Signed)
Summary: F/U 6WK IN [place]   Visit Type:  Follow-up Referring Provider:  Dr. Laverle Hobby Primary Provider:  Dr Earl Gala  CC:  NO CARDIOLOGY COMPLAINTS.  History of Present Illness: [name] returns today for followup.  He is a pleasant elderly man with a h/o ICM, CHF, s/p BiV ICD implant.  He has had problems with recurrent atrial fibrillation.  He has had worsening CHF symptoms over the past few months.  He denies c/p or ICD shocks but does have mild peripheral edema and orthopnea.  His weight got up over 175 pounds but is now down to 165.after lasix.  When I saw him last, I asked him to start weighing himself every day.  Since then he has done so and has kept his weight around 165 lbs.  He states that he feels well.  No peripheral edema or ICD shock.  Current Medications (verified): 1)  Coreg 3.125 Mg  Tabs (Carvedilol) .... Take 1 Tablet By Mouth Once A Day 2)  Synthroid 112 Mcg  Tabs (Levothyroxine Sodium) .... Take 1 Tablet By Mouth Once A Day 3)  Aspirin 81 Mg Tbec (Aspirin) .... Take One Tablet By Mouth Daily 4)  Warfarin Sodium 2 Mg Tabs (Warfarin Sodium) .... Use As Directed By Anticoagualtion Clinic 5)  Multivitamins  Tabs (Multiple Vitamin) .... 1/2 Tablet Daily 6)  Furosemide 40 Mg Tabs (Furosemide) .... Take 1 Tablet By Mouth Once Daily 7)  Pravastatin Sodium 40 Mg Tabs (Pravastatin Sodium) .... Take 1 Tab Daily  Allergies (verified): No Known Drug Allergies  Past History:  Past Medical History: Last updated: 01/20/2010 ASCVD-status post CABG surgery; ischemic cardiomyopathy with an EF of 25%; status post AICD implantation Mitral regurgitation Hypertension Hypothyroid  Past Surgical History: Last updated: 09/26/2007 cabg automatic cardiac defibrillator  Review of Systems  The patient denies chest pain, syncope, dyspnea on exertion, and peripheral edema.    Vital Signs:  Patient profile:   77 year old male Weight:      175 pounds Pulse rate:   66 /  minute BP sitting:   122 / 76  (right arm)  Vitals Entered By: Dreama Saa, CNA (March 14, 2010 4:00 PM)  Physical Exam  General:    Well developed; no acute distress:   Neck-7 cm JVD; no carotid bruits: Weight-175 (165 at home), unchanged from his last visit Lungs-No tachypnea,few bibasilar rales; no rhonchi; no wheezes: Cardiovascular-enlarged and laterally displaced PMI; normal S1 and S2; grade 2-3/6 holosystolic apical murmur; no third heart sound appreciated Abdomen-BS normal; soft and non-tender without masses or organomegaly:  Musculoskeletal-No deformities, no cyanosis or clubbing: Neurologic-Normal cranial nerves; symmetric strength and tone:  Skin-Warm, no significant lesions: Extremities-Nl distal pulses; 1+ edema:      ICD Specifications Following MD:  Adam Bunting, MD     Referring MD:  Riley Kill ICD Vendor:  [employer]     ICD Model Number:  H170     ICD Serial Number:  [id number] ICD DOI:  01/31/2006     ICD Implanting MD:  Adam Bunting, MD Research Study Name MADIT CRT  Lead 1:    Location: RA     DOI: 01/31/2006     Model #: 5621     Serial #: [id number]     Status: active Lead 2:    Location: RV     DOI: 01/31/2006     Model #: 8469     Serial #: [id number]     Status: active Lead 3:  Location: LV     DOI: 01/31/2006     Model #: 1058T     Serial #: [id number]     Status: active  Indications::  ICM, CHF   ICD Follow Up Remote Check?  No Battery Voltage:  2.58 V     Charge Time:  11.0 seconds     Battery Est. Longevity:  MOL2 Underlying rhythm:  dependent ICD Dependent:  Yes       ICD Device Measurements Atrium:  Amplitude: 1.5 mV, Impedance: 641 ohms, Threshold: 1.2 V at 0.4 msec Right Ventricle:  Impedance: 530 ohms, Threshold: 0.8 V at 0.4 msec Left Ventricle:  Impedance: 537 ohms, Threshold: 0.8 V at 0.4 msec Configuration: LV TIP TO RV COIL Shock Impedance: 41 ohms   Episodes MS Episodes:  0     Percent Mode Switch:  0     Coumadin:  Yes Shock:  0      ATP:  0     Nonsustained:  0     Atrial Pacing:  90%     Ventricular Pacing:  100%  Brady Parameters Mode DDDR     Lower Rate Limit:  60     Upper Rate Limit 140 PAV 130      Tachy Zones VF:  210     VT:  180     Next Remote Date:  06/16/2010     Next Cardiology Appt Due:  03/05/2011 Tech Comments:  No parameter changes.  Device function normal. Latitude transmissions every 3 months.  ROV 1 year with Dr. Ladona Ridgel in [place]. Altha Harm, LPN  March 14, 2010 4:27 PM  MD Comments:  Agree with above.  Impression & Recommendations:  Problem # 1:  CHRONIC SYSTOLIC HEART FAILURE (ICD-428.22) His symptoms appear to be nicely controllled on meds as below and a low sodium diet.   His updated medication list for this problem includes:    Coreg 3.125 Mg Tabs (Carvedilol) .Marland Kitchen... Take 1 tablet by mouth once a day    Aspirin 81 Mg Tbec (Aspirin) .Marland Kitchen... Take one tablet by mouth daily    Warfarin Sodium 2 Mg Tabs (Warfarin sodium) ..... Use as directed by anticoagualtion clinic    Furosemide 40 Mg Tabs (Furosemide) .Marland Kitchen... Take 1 tablet by mouth once daily  Problem # 2:  AUTOMATIC IMPLANTABLE CARDIAC DEFIBRILLATOR SITU (ICD-V45.02) His device is working normally.  Will recheck in several months.  Problem # 3:  ATRIAL FIBRILLATION (ICD-427.31) He appears to have reverted back to NSR despite being off of amiodarone. His updated medication list for this problem includes:    Coreg 3.125 Mg Tabs (Carvedilol) .Marland Kitchen... Take 1 tablet by mouth once a day    Aspirin 81 Mg Tbec (Aspirin) .Marland Kitchen... Take one tablet by mouth daily    Warfarin Sodium 2 Mg Tabs (Warfarin sodium) ..... Use as directed by anticoagualtion clinic  Patient Instructions: 1)  Your physician recommends that you schedule a follow-up appointment in: 3 months 2)  Your physician recommends that you continue on your current medications as directed. Please refer to the Current Medication list given to you today.

## 2010-10-06 NOTE — Letter (Signed)
Summary: [facility], [facility] at [employer]  [address] [place] [address]   Phone: [phone]  Fax: [phone]     February 03, 2010 MRN: [id number]   [place] [address] , [place]  [address]      YOUR LAB WORK IS DUE   ____________AUGUST 8, 2011_____________________________  Please go to [facility], located across the street from [facility] on the second floor.  Hours are Monday - Friday 7am until 7:30pm         Saturday 8am until 12noon    __  DO NOT EAT OR DRINK AFTER MIDNIGHT EVENING PRIOR TO LABWORK  _X_ YOUR LABWORK IS NOT FASTING --YOU MAY EAT PRIOR TO LABWORK

## 2010-10-06 NOTE — Medication Information (Signed)
Summary: ccr-lr  Anticoagulant Therapy  Managed by: Vashti Hey, RN Referring MD: Riley Kill PCP: Dr Leighton Ruff MD: Dietrich Pates MD, Molly Maduro Indication 1: Atrial Fibrillation (ICD-427.31) Lab Used: [facility] [place] Site: [place] INR POC 2.4  Dietary changes: no    Health status changes: no    Bleeding/hemorrhagic complications: no    Recent/future hospitalizations: no    Any changes in medication regimen? no    Recent/future dental: no  Any missed doses?: no       Is patient compliant with meds? yes       Allergies: No Known Drug Allergies  Anticoagulation Management History:      The patient is taking warfarin and comes in today for a routine follow up visit.  Positive risk factors for bleeding include an age of 75 years or older and presence of serious comorbidities.  The bleeding index is 'intermediate risk'.  Positive CHADS2 values include History of CHF and Age > 70 years old.  The start date was 06/16/2006.  His last INR was 3.49.  Anticoagulation responsible Aparna Vanderweele: Dietrich Pates MD, Molly Maduro.  INR POC: 2.4.  Cuvette Lot#: [id number].  Exp: 05/2011.    Anticoagulation Management Assessment/Plan:      The patient's current anticoagulation dose is Warfarin sodium 2 mg tabs: Use as directed by Anticoagualtion Clinic.  The target INR is 2 - 3.  The next INR is due 07/07/2010.  Anticoagulation instructions were given to patient.  Results were reviewed/authorized by Vashti Hey, RN.  He was notified by Vashti Hey RN.         Prior Anticoagulation Instructions: INR 2.4 Continue coumadin 2mg  once daily except 1mg  on Tuesdays and Saturdays  Current Anticoagulation Instructions: Same as Prior Instructions.

## 2010-10-06 NOTE — Progress Notes (Signed)
Summary: refill  Phone Note Refill Request Message from:  Patient on Jan 06, 2010 9:16 AM  Refills Requested: Medication #1:  CEFTIN 250 MG TABS take one tablet by mouth twice a day for 7 days. Send to [facility] [phone]  Initial call taken by: Judie Grieve,  Jan 06, 2010 9:16 AM  Follow-up for Phone Call        Prescription given only for 7 days. Follow-up by: Vikki Ports,  Jan 06, 2010 8:58 AM  Additional Follow-up for Phone Call Additional follow up Details #1::        I spoke with the pt and made him aware that if he feels he needs another antibiotic he should contact his PCP for evaluation. Pt will contact PCP. Julieta Gutting RN-BSN 9:01 01/06/10

## 2010-10-06 NOTE — Medication Information (Signed)
Summary: ccr-lr  Anticoagulant Therapy  Managed by: Vashti Hey, RN Referring MD: Riley Kill PCP: Dr Leighton Ruff MD: Dietrich Pates MD, Molly Maduro Indication 1: Atrial Fibrillation (ICD-427.31) Lab Used: [facility] [place] Site: [place] INR POC 4.5  Dietary changes: no    Health status changes: no    Bleeding/hemorrhagic complications: no    Recent/future hospitalizations: no    Any changes in medication regimen? no    Recent/future dental: no  Any missed doses?: no       Is patient compliant with meds? yes       Allergies: No Known Drug Allergies  Anticoagulation Management History:      The patient is taking warfarin and comes in today for a routine follow up visit.  Positive risk factors for bleeding include an age of 24 years or older.  The bleeding index is 'intermediate risk'.  Positive CHADS2 values include History of CHF and Age > 21 years old.  The start date was 06/16/2006.  His last INR was 3.49.  Anticoagulation responsible provider: Dietrich Pates MD, Molly Maduro.  INR POC: 4.5.  Cuvette Lot#: [id number].  Exp: 10/11.    Anticoagulation Management Assessment/Plan:      The patient's current anticoagulation dose is Coumadin 5 mg tabs: as directed 2.5mg  daily INR on 10-07-09   3.7.  The target INR is 2 - 3.  The next INR is due 11/25/2009.  Anticoagulation instructions were given to patient.  Results were reviewed/authorized by Vashti Hey, RN.  He was notified by Vashti Hey RN.         Prior Anticoagulation Instructions: INR 3.1 Continue coumadin 2.5mg  once daily except none on Thursdays  Current Anticoagulation Instructions: INR 4.5 Hold coumadin today and tomorrow then decrease dose to 2.5mg  once daily except none on Mondays and Thursdays

## 2010-10-06 NOTE — Miscellaneous (Signed)
Summary: labs bmp,04/12/2010  Clinical Lists Changes  Observations: Added new observation of CALCIUM: 9.3 mg/dL (16/06/9603 5:40) Added new observation of CREATININE: 1.80 mg/dL (98/07/9146 8:29) Added new observation of BUN: 36 mg/dL (56/21/3086 5:78) Added new observation of BG RANDOM: 97 mg/dL (46/96/2952 8:41) Added new observation of CO2 PLSM/SER: 28 meq/L (04/12/2010 7:59) Added new observation of CL SERUM: 106 meq/L (04/12/2010 7:59) Added new observation of K SERUM: 4.8 meq/L (04/12/2010 7:59) Added new observation of NA: 144 meq/L (04/12/2010 7:59)

## 2010-10-06 NOTE — Medication Information (Signed)
Summary: ccr-lr  Anticoagulant Therapy  Managed by: Vashti Hey, RN Referring MD: Riley Kill PCP: Dr Leighton Ruff MD: Diona Browner MD, Remi Deter Indication 1: Atrial Fibrillation (ICD-427.31) Lab Used: [facility] [place] Site: [place] INR POC 3.1  Dietary changes: no    Health status changes: no    Bleeding/hemorrhagic complications: no    Recent/future hospitalizations: no    Any changes in medication regimen? no    Recent/future dental: no  Any missed doses?: no       Is patient compliant with meds? yes       Allergies: No Known Drug Allergies  Anticoagulation Management History:      Positive risk factors for bleeding include an age of 75 years or older.  The bleeding index is 'intermediate risk'.  Positive CHADS2 values include History of CHF and Age > 45 years old.  The start date was 06/16/2006.  His last INR was 3.49.  Anticoagulation responsible provider: Diona Browner MD, Remi Deter.  INR POC: 3.1.  Exp: 10/11.    Anticoagulation Management Assessment/Plan:      The patient's current anticoagulation dose is Coumadin 5 mg tabs: as directed 2.5mg  daily.  The target INR is 2 - 3.  The next INR is due 11/11/2009.  Anticoagulation instructions were given to patient.  Results were reviewed/authorized by Vashti Hey, RN.  He was notified by Vashti Hey RN.         Prior Anticoagulation Instructions: INR 3.5 Decrease coumadin to 2.5mg  once daily except none on Thursdays  Current Anticoagulation Instructions: INR 3.1 Continue coumadin 2.5mg  once daily except none on Thursdays

## 2010-10-06 NOTE — Letter (Signed)
Summary: [name] Physicians Office Visit Note   [facility] Physicians Office Visit Note   Imported By: Roderic Ovens 07/08/2010 12:56:35  _____________________________________________________________________  External Attachment:    Type:   Image     Comment:   External Document

## 2010-10-06 NOTE — Medication Information (Signed)
Summary: ccr-lr  Anticoagulant Therapy  Managed by: Teressa Lower, RN Referring MD: Riley Kill PCP: Dr Leighton Ruff MD: Dietrich Pates MD, Molly Maduro Indication 1: Atrial Fibrillation (ICD-427.31) Lab Used: [facility] [place] Site: [place] INR POC 2.4  Dietary changes: no    Health status changes: no    Bleeding/hemorrhagic complications: no    Recent/future hospitalizations: no    Any changes in medication regimen? no    Recent/future dental: no  Any missed doses?: no       Is patient compliant with meds? yes       Current Medications (verified): 1)  Coreg 3.125 Mg  Tabs (Carvedilol) .... Take 1 Tablet By Mouth Once A Day 2)  Synthroid 112 Mcg  Tabs (Levothyroxine Sodium) .... Take 1 Tablet By Mouth Once A Day 3)  Aspirin 81 Mg Tbec (Aspirin) .... Take One Tablet By Mouth Daily 4)  Warfarin Sodium 2 Mg Tabs (Warfarin Sodium) .... Use As Directed By Anticoagualtion Clinic 5)  Multivitamins  Tabs (Multiple Vitamin) .... 1/2 Tablet Daily 6)  Furosemide 40 Mg Tabs (Furosemide) .... Take 1 Tablet By Mouth Once Daily 7)  Pravastatin Sodium 40 Mg Tabs (Pravastatin Sodium) .... Take 1 Tab Daily  Allergies (verified): No Known Drug Allergies  Anticoagulation Management History:      The patient is taking warfarin and comes in today for a routine follow up visit.  Positive risk factors for bleeding include an age of 75 years or older and presence of serious comorbidities.  The bleeding index is 'intermediate risk'.  Positive CHADS2 values include History of CHF and Age > 61 years old.  The start date was 06/16/2006.  His last INR was 3.49.  Anticoagulation responsible provider: Dietrich Pates MD, Molly Maduro.  INR POC: 2.4.  Cuvette Lot#: [id number].  Exp: 05/2011.    Anticoagulation Management Assessment/Plan:      The patient's current anticoagulation dose is Warfarin sodium 2 mg tabs: Use as directed by Anticoagualtion Clinic.  The target INR is 2 - 3.  The next  INR is due 05/12/2010.  Anticoagulation instructions were given to patient.  Results were reviewed/authorized by Teressa Lower, RN.  He was notified by Teressa Lower RN.         Prior Anticoagulation Instructions: INR 2.6 Continue coumadin 2mg  once daily except 1mg  on Tuesdays and Saturdays  Current Anticoagulation Instructions: INR 2.4 TODAY NO CHANGE IN CURRENT DOSE OF  1/2 TABLET ON TUESDAY AND SATURDAYS AND 1 TABLET ALL OTHER DAYS

## 2010-10-06 NOTE — Medication Information (Signed)
Summary: ccr-lr  Anticoagulant Therapy  Managed by: Vashti Hey, RN Referring MD: Riley Kill PCP: Dr Leighton Ruff MD: Dietrich Pates MD, Molly Maduro Indication 1: Atrial Fibrillation (ICD-427.31) Lab Used: [facility] [place] Site: [place] INR POC 2.5  Dietary changes: no    Health status changes: no    Bleeding/hemorrhagic complications: no    Recent/future hospitalizations: no    Any changes in medication regimen? no    Recent/future dental: no  Any missed doses?: no       Is patient compliant with meds? yes       Allergies: No Known Drug Allergies  Anticoagulation Management History:      The patient is taking warfarin and comes in today for a routine follow up visit.  Positive risk factors for bleeding include an age of 75 years or older and presence of serious comorbidities.  The bleeding index is 'intermediate risk'.  Positive CHADS2 values include History of CHF and Age > 25 years old.  The start date was 06/16/2006.  His last INR was 3.49.  Anticoagulation responsible provider: Dietrich Pates MD, Molly Maduro.  INR POC: 2.5.  Exp: 10/11.    Anticoagulation Management Assessment/Plan:      The patient's current anticoagulation dose is Warfarin sodium 2 mg tabs: Use as directed by Anticoagualtion Clinic.  The target INR is 2 - 3.  The next INR is due 03/14/2010.  Anticoagulation instructions were given to patient.  Results were reviewed/authorized by Vashti Hey, RN.  He was notified by Vashti Hey RN.         Prior Anticoagulation Instructions: INR 2.8 Continue coumadin 2mg  once daily except 1mg  on Tuesdays and Saturdays  Current Anticoagulation Instructions: INR 2.5 Continue coumadin 2mg  once daily except 1mg  on Tuesdays and Saturdays

## 2010-10-06 NOTE — Assessment & Plan Note (Signed)
Summary: F3M   Visit Type:  Follow-up Referring Provider:  Dr. Laverle Hobby Primary Provider:  Dr Earl Gala  CC:  no cardiology complaints.  History of Present Illness: Mr. Attwood returns today for followup.  He is a pleasant elderly man with a h/o ICM, CHF, s/p BiV ICD implant.    He has had no worsening CHF symptoms over the past few months.  He denies c/p or ICD shocks but does have mild peripheral edema and orthopnea.   He states that he feels well.  No peripheral edema or ICD shock.   Current Medications (verified): 1)  Coreg 3.125 Mg  Tabs (Carvedilol) .... Take 1 Tablet By Mouth Once A Day 2)  Synthroid 112 Mcg  Tabs (Levothyroxine Sodium) .... Take 1 Tablet By Mouth Once A Day 3)  Aspirin 81 Mg Tbec (Aspirin) .... Take One Tablet By Mouth Daily 4)  Warfarin Sodium 2 Mg Tabs (Warfarin Sodium) .... Use As Directed By Anticoagualtion Clinic 5)  Multivitamins  Tabs (Multiple Vitamin) .... 1/2 Tablet Daily 6)  Furosemide 40 Mg Tabs (Furosemide) .... Take 1 Tablet By Mouth Once Daily 7)  Pravastatin Sodium 40 Mg Tabs (Pravastatin Sodium) .... Take 1 Tab Daily  Allergies (verified): No Known Drug Allergies  Comments:  Nurse/Medical Assistant: patient reviewed med list from previous ov  Past History:  Past Medical History: Last updated: 01/20/2010 ASCVD-status post CABG surgery; ischemic cardiomyopathy with an EF of 25%; status post AICD implantation Mitral regurgitation Hypertension Hypothyroid  Past Surgical History: Last updated: 09/26/2007 cabg automatic cardiac defibrillator  Review of Systems  The patient denies chest pain, syncope, dyspnea on exertion, and peripheral edema.    Vital Signs:  Patient profile:   77 year old male Weight:      178 pounds BMI:     24.23 Pulse rate:   67 / minute BP sitting:   126 / 83  (right arm)  Vitals Entered By: Dreama Saa, CNA (July 19, 2010 1:53 PM)  Physical Exam  General:    Well developed; no acute  distress:   Neck-7 cm JVD; no carotid bruits: Weight-175 (165 at home), unchanged from his last visit Lungs-No tachypnea,few bibasilar rales; no rhonchi; no wheezes:Well healed ICD incision. Cardiovascular-enlarged and laterally displaced PMI; normal S1 and S2; grade 2-3/6 holosystolic apical murmur; no third heart sound appreciated Abdomen-BS normal; soft and non-tender without masses or organomegaly:  Musculoskeletal-No deformities, no cyanosis or clubbing: Neurologic-Normal cranial nerves; symmetric strength and tone:  Skin-Warm, no significant lesions: Extremities-Nl distal pulses; 1+ edema:      ICD Specifications Following MD:  Lewayne Bunting, MD     Referring MD:  Riley Kill ICD Vendor:  [employer]     ICD Model Number:  H170     ICD Serial Number:  [id number] ICD DOI:  01/31/2006     ICD Implanting MD:  Lewayne Bunting, MD Research Study Name MADIT CRT  Lead 1:    Location: RA     DOI: 01/31/2006     Model #: [id number]     Serial #: [id number]     Status: active Lead 2:    Location: RV     DOI: 01/31/2006     Model #: 7616     Serial #: [id number]     Status: active Lead 3:    Location: LV     DOI: 01/31/2006     Model #: 1058T     Serial #: [id number]     Status: active  Indications::  ICM, CHF   ICD Follow Up Remote Check?  No Battery Voltage:  good V     Charge Time:  10.8 seconds     Battery Est. Longevity:  MOL2 Underlying rhythm:  dependent ICD Dependent:  Yes       ICD Device Measurements Atrium:  Amplitude: 1.6 mV, Impedance: 624 ohms, Threshold: 1.2 V at 0.4 msec Right Ventricle:  Impedance: 524 ohms, Threshold: 0.8 V at 0.4 msec Left Ventricle:  Impedance: 537 ohms, Threshold: 0.8 V at 0.4 msec Configuration: LV TIP TO RV COIL  Episodes MS Episodes:  0     Percent Mode Switch:  0     Coumadin:  Yes Shock:  0     ATP:  0     Nonsustained:  0     Atrial Pacing:  70%     Ventricular Pacing:  100%  Brady Parameters Mode DDDR     Lower Rate Limit:  60     Upper Rate Limit  140 PAV 130      Tachy Zones VF:  210     VT:  180     Next Remote Date:  10/20/2010     Next Cardiology Appt Due:  07/06/2011 Tech Comments:  No parameter changes.  Device function normal.   Latitude transmissions every 3 months.  ROV 1 year with Dr. Ladona Ridgel in [place] Altha Harm, LPN  July 19, 2010 2:15 PM  MD Comments:  Agree with above.  Impression & Recommendations:  Problem # 1:  AUTOMATIC IMPLANTABLE CARDIAC DEFIBRILLATOR SITU (ICD-V45.02) His device is working normally.  will recheck in several months.  Problem # 2:  CHRONIC SYSTOLIC HEART FAILURE (ICD-428.22) His symptoms are well controlled.  He has class 2 CHF.  I have discussed the importance of a low sodium diet and daily weights. His updated medication list for this problem includes:    Coreg 3.125 Mg Tabs (Carvedilol) .Marland Kitchen... Take 1 tablet by mouth once a day    Aspirin 81 Mg Tbec (Aspirin) .Marland Kitchen... Take one tablet by mouth daily    Warfarin Sodium 2 Mg Tabs (Warfarin sodium) ..... Use as directed by anticoagualtion clinic    Furosemide 40 Mg Tabs (Furosemide) .Marland Kitchen... Take 1 tablet by mouth once daily  Patient Instructions: 1)  Your physician recommends that you schedule a follow-up appointment in: 5 months

## 2010-10-06 NOTE — Miscellaneous (Signed)
Summary: LABS CBCD,CMP,BNP,01/25/2010  Clinical Lists Changes  Observations: Added new observation of CALCIUM: 8.7 mg/dL (81/19/1478 29:56) Added new observation of ALBUMIN: 3.9 g/dL (21/30/8657 84:69) Added new observation of PROTEIN, TOT: 6.4 g/dL (62/95/2841 32:44) Added new observation of SGPT (ALT): 21 units/L (01/25/2010 15:51) Added new observation of SGOT (AST): 29 units/L (01/25/2010 15:51) Added new observation of ALK PHOS: 65 units/L (01/25/2010 15:51) Added new observation of CREATININE: 1.89 mg/dL (09/06/7251 66:44) Added new observation of BUN: 34 mg/dL (03/47/4259 56:38) Added new observation of BG RANDOM: 70 mg/dL (75/64/3329 51:88) Added new observation of CO2 PLSM/SER: 30 meq/L (01/25/2010 15:51) Added new observation of CL SERUM: 101 meq/L (01/25/2010 15:51) Added new observation of K SERUM: 4.4 meq/L (01/25/2010 15:51) Added new observation of NA: 140 meq/L (01/25/2010 15:51) Added new observation of BNP: 562.0  (01/25/2010 15:51) Added new observation of ABSOLUTE BAS: 0.0 K/uL (01/25/2010 15:51) Added new observation of BASOPHIL %: 1 % (01/25/2010 15:51) Added new observation of EOS ABSLT: 0.2 K/uL (01/25/2010 15:51) Added new observation of % EOS AUTO: 3 % (01/25/2010 15:51) Added new observation of ABSOLUTE MON: 0.9 K/uL (01/25/2010 15:51) Added new observation of MONOCYTE %: 15 % (01/25/2010 15:51) Added new observation of ABS LYMPHOCY: 1.1 K/uL (01/25/2010 15:51) Added new observation of LYMPHS %: 18 % (01/25/2010 15:51) Added new observation of PLATELETK/UL: 167 K/uL (01/25/2010 15:51) Added new observation of RDW: 15.9 % (01/25/2010 15:51) Added new observation of MCHC RBC: 31.2 g/dL (41/66/0630 16:01) Added new observation of MCV: 100.2 fL (01/25/2010 15:51) Added new observation of HCT: 40.7 % (01/25/2010 15:51) Added new observation of HGB: 12.7 g/dL (09/32/3557 32:20) Added new observation of RBC M/UL: 4.06 M/uL (01/25/2010 15:51) Added new observation  of WBC COUNT: 6.0 10*3/microliter (01/25/2010 15:51)

## 2010-10-06 NOTE — Medication Information (Signed)
Summary: ccr-lr  Anticoagulant Therapy  Managed by: Vashti Hey, RN Referring MD: Riley Kill PCP: Dr Leighton Ruff MD: Diona Browner MD, Remi Deter Indication 1: Atrial Fibrillation (ICD-427.31) Lab Used: [facility] [place] Site: [place] INR POC 4.2  Dietary changes: no    Health status changes: yes       Details: had stomach virus  Bleeding/hemorrhagic complications: no    Recent/future hospitalizations: no    Any changes in medication regimen? no    Recent/future dental: no  Any missed doses?: yes     Details: missed 1 dose during stomach virus about 2 weeks ago  Is patient compliant with meds? yes       Allergies: No Known Drug Allergies  Anticoagulation Management History:      The patient is taking warfarin and comes in today for a routine follow up visit.  Positive risk factors for bleeding include an age of 75 years or older.  The bleeding index is 'intermediate risk'.  Positive CHADS2 values include History of CHF and Age > 43 years old.  The start date was 06/16/2006.  His last INR was 3.49.  Anticoagulation responsible provider: Diona Browner MD, Remi Deter.  INR POC: 4.2.  Cuvette Lot#: [id number].  Exp: 10/11.    Anticoagulation Management Assessment/Plan:      The patient's current anticoagulation dose is Coumadin 5 mg tabs: as directed 2.5mg  daily INR on 10-07-09   3.7.  The target INR is 2 - 3.  The next INR is due 12/09/2009.  Anticoagulation instructions were given to patient.  Results were reviewed/authorized by Vashti Hey, RN.         Prior Anticoagulation Instructions: INR 4.5 Hold coumadin today and tomorrow then decrease dose to 2.5mg  once daily except none on Mondays and Thursdays  Current Anticoagulation Instructions: INR 4.2 Hold coumadin tonight then decrease dose to 2.5mg  once daily except none on Mondays, Wednesdays and Fridays

## 2010-10-06 NOTE — Progress Notes (Signed)
Summary: afib again, feeling weak  Phone Note Call from Patient Call back at Home Phone [phone]   Caller: Patient Reason for Call: Talk to Nurse Summary of Call: c/o feeling weak pt think he might be in afib again.  Initial call taken by: Lorne Skeens,  Jan 12, 2010 10:22 AM  Follow-up for Phone Call        I spoke with the pt and he said he has been feeling weak for a couple of days.  The pt thinks he might be in AFIB.  The pt would like to send a transmission by phone to the device nurses.  Also the pt did f/u with his PCP and was having problems with sinus drainage. I spoke with Amber and she will check for transmission.  Follow-up by: Julieta Gutting, RN, BSN,  Jan 12, 2010 12:12 PM  Additional Follow-up for Phone Call Additional follow up Details #1::        I spoke with Gypsy Balsam RN and she reviewed the pt's transmission today and he is in NSR. Julieta Gutting, RN, BSN  Jan 12, 2010 2:16 PM  I made the pt aware that he is in NSR.  The pt said he felt like his weakness is coming from his recent bronchitis and sinus issues.  The pt will call the office if he has any further questions or concerns.  Additional Follow-up by: Julieta Gutting, RN, BSN,  Jan 12, 2010 2:20 PM

## 2010-10-06 NOTE — Medication Information (Signed)
Summary: CCR/JML  Anticoagulant Therapy  Managed by: Vashti Hey, RN Referring MD: Riley Kill PCP: Dr Leighton Ruff MD: Dietrich Pates MD, Molly Maduro Indication 1: Atrial Fibrillation (ICD-427.31) Lab Used: [facility] [place] Site: [place] INR POC 2.4  Dietary changes: no    Health status changes: no    Bleeding/hemorrhagic complications: no    Recent/future hospitalizations: no    Any changes in medication regimen? no    Recent/future dental: no  Any missed doses?: no       Is patient compliant with meds? yes       Allergies: No Known Drug Allergies  Anticoagulation Management History:      The patient is taking warfarin and comes in today for a routine follow up visit.  Positive risk factors for bleeding include an age of 75 years or older and presence of serious comorbidities.  The bleeding index is 'intermediate risk'.  Positive CHADS2 values include History of CHF and Age > 84 years old.  The start date was 06/16/2006.  His last INR was 3.49.  Anticoagulation responsible provider: Dietrich Pates MD, Molly Maduro.  INR POC: 2.4.  Cuvette Lot#: [id number].  Exp: 05/2011.    Anticoagulation Management Assessment/Plan:      The patient's current anticoagulation dose is Warfarin sodium 2 mg tabs: Use as directed by Anticoagualtion Clinic.  The target INR is 2 - 3.  The next INR is due 06/09/2010.  Anticoagulation instructions were given to patient.  Results were reviewed/authorized by Vashti Hey, RN.  He was notified by Vashti Hey RN.         Prior Anticoagulation Instructions: INR 2.4 TODAY NO CHANGE IN CURRENT DOSE OF  1/2 TABLET ON TUESDAY AND SATURDAYS AND 1 TABLET ALL OTHER DAYS  Current Anticoagulation Instructions: INR 2.4 Continue coumadin 2mg  once daily except 1mg  on Tuesdays and Saturdays

## 2010-10-06 NOTE — Assessment & Plan Note (Signed)
Summary: pc2/per Baxter Hire   Visit Type:  Follow-up Referring Provider:  Dr. Laverle Hobby Primary Provider:  Dr Earl Gala   History of Present Illness: [name] returns today for followup.  He is a pleasant elderly man with a h/o ICM, CHF, s/p BiV ICD implant.  He has had problems with recurrent atrial fibrillation.  He has had worsening CHF symptoms over the past few months.  He denies c/p or ICD shocks but does have mild peripheral edema and orthopnea.  His weight got up over 175 pounds but is now down to 166 after lasix.  Current Medications (verified): 1)  Coreg 3.125 Mg  Tabs (Carvedilol) .... Take 1 Tablet By Mouth Once A Day 2)  Synthroid 112 Mcg  Tabs (Levothyroxine Sodium) .... Take 1 Tablet By Mouth Once A Day 3)  Aspirin 81 Mg Tbec (Aspirin) .... Take One Tablet By Mouth Daily 4)  Warfarin Sodium 2 Mg Tabs (Warfarin Sodium) .... Use As Directed By Anticoagualtion Clinic 5)  Multivitamins  Tabs (Multiple Vitamin) .... 1/2 Tablet Daily 6)  Simvastatin 20 Mg Tabs (Simvastatin) .... Take 1 Tab Daily 7)  Furosemide 40 Mg Tabs (Furosemide) .... Take 1 Tablet By Mouth Once Daily  Allergies (verified): No Known Drug Allergies  Past History:  Past Medical History: Last updated: 01/20/2010 ASCVD-status post CABG surgery; ischemic cardiomyopathy with an EF of 25%; status post AICD implantation Mitral regurgitation Hypertension Hypothyroid  Past Surgical History: Last updated: 09/26/2007 cabg automatic cardiac defibrillator  Review of Systems       The patient complains of dyspnea on exertion and peripheral edema.  The patient denies chest pain and syncope.    Vital Signs:  Patient profile:   77 year old male Height:      72 inches Weight:      174 pounds BMI:     23.68 Pulse rate:   64 / minute BP sitting:   98 / 64  (left arm)  Vitals Entered By: Laurance Flatten CMA (Jan 25, 2010 3:32 PM)  Physical Exam  General:    Well developed; no acute distress:   Neck-Mild  JVD; no carotid bruits: Weight-174 (166 at home), unchanged from his last visit Lungs-No tachypnea,few bibasilar rales; no rhonchi; no wheezes: Cardiovascular-normal PMI; normal S1 and S2; grade 2-3/6 holosystolic apical murmur; no third heart sound appreciated Abdomen-BS normal; soft and non-tender without masses or organomegaly:  Musculoskeletal-No deformities, no cyanosis or clubbing: Neurologic-Normal cranial nerves; symmetric strength and tone:  Skin-Warm, no significant lesions: Extremities-Nl distal pulses; 1+ edema:      ICD Specifications Following MD:  Adam Bunting, MD     Referring MD:  Riley Kill ICD Vendor:  [employer]     ICD Model Number:  H170     ICD Serial Number:  [id number] ICD DOI:  01/31/2006     ICD Implanting MD:  Adam Bunting, MD Research Study Name MADIT CRT  Lead 1:    Location: RA     DOI: 01/31/2006     Model #: [id number]     Serial #: [id number]     Status: active Lead 2:    Location: RV     DOI: 01/31/2006     Model #: [id number]     Serial #: [id number]     Status: active Lead 3:    Location: LV     DOI: 01/31/2006     Model #: [id number]     Serial #: [id number]     Status: active  Indications::  ICM, CHF   ICD Follow Up Remote Check?  No ICD Dependent:  Yes       ICD Device Measurements Configuration: LV TIP TO RV COIL  Episodes Coumadin:  Yes Shock:  0     ATP:  0     Nonsustained:  0      Brady Parameters Mode DDDR     Lower Rate Limit:  60     Upper Rate Limit 140 PAV 130      Tachy Zones VF:  210     VT:  180     Tech Comments:  Pt seen because of increased LV lead impedence on Latitude transmission while programmed bipolar.  LV tip-RV coil impedence 525 threshold 1.2V at 0.75msec, LV ring to RV coil, impedence 1111, threshold 7.5V at 0.60msec.  Left programmed LV tip to RV coil with output at 2.4V at 0.48msec.  Follow up as scheduled with [employer]. Gypsy Balsam RN BSN  Jan 25, 2010 3:45 PM  MD Comments:  Agree with above.  Impression & Recommendations:  Problem  # 1:  AUTOMATIC IMPLANTABLE CARDIAC DEFIBRILLATOR SITU (ICD-V45.02) His device demonstrates bipolar pacing malfunction in the LV which was resolved by switching to unipolar LV pacing.  Device is reprogrammed.  Will recheck in several months.   Problem # 2:  CHRONIC SYSTOLIC HEART FAILURE (ICD-428.22) His symptoms are improved with lasix.  He was not initially BiV pacing as his LV lead did not capture.  This has been resolved.  He will continue his current meds.  I have asked him to follow his weight carefully at home and given him written instruction on how I would like for him to dose his Lasix. His updated medication list for this problem includes:    Coreg 3.125 Mg Tabs (Carvedilol) .Marland Kitchen... Take 1 tablet by mouth once a day    Aspirin 81 Mg Tbec (Aspirin) .Marland Kitchen... Take one tablet by mouth daily    Warfarin Sodium 2 Mg Tabs (Warfarin sodium) ..... Use as directed by anticoagualtion clinic    Furosemide 40 Mg Tabs (Furosemide) .Marland Kitchen... Take 1 tablet by mouth once daily  Problem # 3:  ATRIAL FIBRILLATION (ICD-427.31) He is in NSR today.  Will continue to followup.  I had initially had him on amiodarone but this has been discontinued.   His updated medication list for this problem includes:    Coreg 3.125 Mg Tabs (Carvedilol) .Marland Kitchen... Take 1 tablet by mouth once a day    Aspirin 81 Mg Tbec (Aspirin) .Marland Kitchen... Take one tablet by mouth daily    Warfarin Sodium 2 Mg Tabs (Warfarin sodium) ..... Use as directed by anticoagualtion clinic  Patient Instructions: 1)  Your physician recommends that you schedule a follow-up appointment in: 6 weeks in [place] 2)  Cancel apt with [facility]

## 2010-10-06 NOTE — Medication Information (Signed)
Summary: ccr-lr  Anticoagulant Therapy  Managed by: Vashti Hey, RN Referring MD: Riley Kill PCP: Dr Leighton Ruff MD: Dietrich Pates MD, Molly Maduro Indication 1: Atrial Fibrillation (ICD-427.31) Lab Used: [facility]  Site: [place] INR POC 2.0  Dietary changes: yes       Details: more turnip greens  Health status changes: no    Bleeding/hemorrhagic complications: no    Recent/future hospitalizations: no    Any changes in medication regimen? no    Recent/future dental: no  Any missed doses?: no       Is patient compliant with meds? yes       Allergies: No Known Drug Allergies  Anticoagulation Management History:      The patient is taking warfarin and comes in today for a routine follow up visit.  Positive risk factors for bleeding include an age of 75 years or older and presence of serious comorbidities.  The bleeding index is 'intermediate risk'.  Positive CHADS2 values include History of CHF and Age > 40 years old.  The start date was 06/16/2006.  His last INR was 3.49.  Anticoagulation responsible Bishoy Cupp: Dietrich Pates MD, Molly Maduro.  INR POC: 2.0.  Cuvette Lot#: [id number].  Exp: 05/2011.    Anticoagulation Management Assessment/Plan:      The patient's current anticoagulation dose is Warfarin sodium 2 mg tabs: Use as directed by Anticoagualtion Clinic.  The target INR is 2 - 3.  The next INR is due 08/04/2010.  Anticoagulation instructions were given to patient.  Results were reviewed/authorized by Vashti Hey, RN.  He was notified by Vashti Hey RN.         Prior Anticoagulation Instructions: INR 2.4 Continue coumadin 2mg  once daily except 1mg  on Tuesdays and Saturdays  Current Anticoagulation Instructions: INR 2.0 Take coumadin 1 1/2 tablets tonight then resume 1 tablet once daily except 1/2 tablet on Tuesdays and Saturdays

## 2010-10-06 NOTE — Cardiovascular Report (Signed)
Summary: Office Visit Remote   Office Visit Remote   Imported By: Roderic Ovens 03/15/2010 13:32:53  _____________________________________________________________________  External Attachment:    Type:   Image     Comment:   External Document

## 2010-10-06 NOTE — Procedures (Signed)
Summary: ROV F/U CARDIOVERSION   Current Medications (verified): 1)  Coreg 3.125 Mg  Tabs (Carvedilol) .... Take 1 Tablet By Mouth Once A Day 2)  Synthroid 112 Mcg  Tabs (Levothyroxine Sodium) .... Take 1 Tablet By Mouth Once A Day 3)  Aspirin 81 Mg Tbec (Aspirin) .... Take One Tablet By Mouth Daily 4)  Coumadin 5 Mg Tabs (Warfarin Sodium) .... As Directed 2.5mg  Daily 5)  Multivitamins  Tabs (Multiple Vitamin) .... 1/2 Tablet Daily 6)  Amiodarone Hcl 200 Mg Tabs (Amiodarone Hcl) .... Take 1 Tablet By Mouth Once Daily 7)  Pravastatin Sodium 40 Mg Tabs (Pravastatin Sodium) .... Take One Tablet By Mouth Once Daily  Allergies (verified): No Known Drug Allergies   ICD Specifications Following MD:  Lewayne Bunting, MD     Referring MD:  [facility] ICD Vendor:  [employer]     ICD Model Number:  H170     ICD Serial Number:  [id number] ICD DOI:  01/31/2006     ICD Implanting MD:  Lewayne Bunting, MD  Lead 1:    Location: RA     DOI: 01/31/2006     Model #: 0454     Serial #: [id number]     Status: active Lead 2:    Location: RV     DOI: 01/31/2006     Model #: 1478     Serial #: [id number]     Status: active Lead 3:    Location: LV     DOI: 01/31/2006     Model #: 1058T     Serial #: [id number]     Status: active  Indications::  ICM, CHF   ICD Follow Up Remote Check?  No Battery Voltage:  2.62 V     Charge Time:  11.3 seconds     Underlying rhythm:  CHB ICD Dependent:  Yes       ICD Device Measurements Atrium:  Amplitude: 1.6 mV, Impedance: 632 ohms, Threshold: 0.8 V at 0.4 msec Right Ventricle:  Amplitude: PACED AT 30 mV, Impedance: 524 ohms, Threshold: 1.0 V at 0.4 msec Left Ventricle:  Impedance: 797 ohms, Threshold: 1.0 V at 0.4 msec Configuration: LV TIP TO LV RING Shock Impedance: 41 ohms   Episodes MS Episodes:  0     Coumadin:  Yes Shock:  0     ATP:  0     Nonsustained:  0     Atrial Pacing:  0%     Ventricular Pacing:  100%  Brady Parameters Mode DDDR     Lower Rate Limit:  60     Upper  Rate Limit 140 PAV 130      Tachy Zones VF:  210     VT:  180     Next Remote Date:  01/06/2010     Next Cardiology Appt Due:  10/05/2010 Tech Comments:  Normal device function.  No afib since DCCV.  Base rate increased to 60 today per Dr Riley Kill to allow for uptitration of meds.  No other changes made.  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  ROV 12 months GT RDS. Gypsy Balsam RN BSN  November 01, 2009 9:50 AM  MD Comments:  Agree with above.

## 2010-10-12 NOTE — Miscellaneous (Signed)
Summary: HEMOCCULT 09/29/2010  Clinical Lists Changes  Observations: Added new observation of HEMOCCULT 3: NEG (09/29/2010 8:52) Added new observation of HEMOCCULT 2: NEG (09/29/2010 8:52) Added new observation of HEMOCCULT 1: NEG (09/29/2010 8:52)

## 2010-10-20 ENCOUNTER — Encounter: Payer: Self-pay | Admitting: Internal Medicine

## 2010-10-20 ENCOUNTER — Encounter (INDEPENDENT_AMBULATORY_CARE_PROVIDER_SITE_OTHER): Payer: Medicare Other

## 2010-10-20 DIAGNOSIS — I428 Other cardiomyopathies: Secondary | ICD-10-CM

## 2010-10-27 ENCOUNTER — Encounter: Payer: Self-pay | Admitting: Cardiology

## 2010-10-27 ENCOUNTER — Encounter (INDEPENDENT_AMBULATORY_CARE_PROVIDER_SITE_OTHER): Payer: Medicare Other

## 2010-10-27 DIAGNOSIS — Z7901 Long term (current) use of anticoagulants: Secondary | ICD-10-CM

## 2010-10-27 DIAGNOSIS — I4891 Unspecified atrial fibrillation: Secondary | ICD-10-CM

## 2010-11-01 NOTE — Medication Information (Signed)
Summary: ccr-lr  Anticoagulant Therapy  Managed by: Adam Hey, RN Referring MD: [name] PCP: Adam Leighton Ruff MD: Adam Pates MD, [name] Indication 1: Atrial Fibrillation (ICD-427.31) Lab Used: [facility] [place] Site: [place] INR POC 1.6  Dietary changes: no    Health status changes: no    Bleeding/hemorrhagic complications: no    Recent/future hospitalizations: no    Any changes in medication regimen? no    Recent/future dental: no  Any missed doses?: no       Is patient compliant with meds? yes       Allergies: No Known Drug Allergies  Anticoagulation Management History:      The patient is taking warfarin and comes in today for a routine follow up visit.  Positive risk factors for bleeding include an age of 75 years or older and presence of serious comorbidities.  The bleeding index is 'intermediate risk'.  Positive CHADS2 values include History of CHF and Age > 65 years old.  The start date was 06/16/2006.  His last INR was 3.49.  Anticoagulation responsible provider: Dietrich Pates MD, [name].  INR POC: 1.6.  Cuvette Lot#: [id number].  Exp: 05/2011.    Anticoagulation Management Assessment/Plan:      The patient's current anticoagulation dose is Warfarin sodium 2 mg tabs: Use as directed by Anticoagualtion Clinic.  The target INR is 2 - 3.  The next INR is due 11/14/2010.  Anticoagulation instructions were given to patient.  Results were reviewed/authorized by Adam Hey, RN.  He was notified by Adam Hey RN.         Prior Anticoagulation Instructions: INR 2.1 Continue coumadin 1 tablet once daily except 1/2 tablet on Tuesdays  Current Anticoagulation Instructions: INR 1.6 Take coumadin 1 1/2 tablets tonight and tomorrow night then increase dose to 1 tablet once daily

## 2010-11-14 ENCOUNTER — Encounter: Payer: Self-pay | Admitting: Cardiology

## 2010-11-14 ENCOUNTER — Encounter (INDEPENDENT_AMBULATORY_CARE_PROVIDER_SITE_OTHER): Payer: Medicare Other

## 2010-11-14 DIAGNOSIS — I4891 Unspecified atrial fibrillation: Secondary | ICD-10-CM

## 2010-11-14 DIAGNOSIS — Z7901 Long term (current) use of anticoagulants: Secondary | ICD-10-CM

## 2010-11-19 ENCOUNTER — Encounter: Payer: Self-pay | Admitting: Cardiology

## 2010-11-19 DIAGNOSIS — I4891 Unspecified atrial fibrillation: Secondary | ICD-10-CM

## 2010-11-19 DIAGNOSIS — Z7901 Long term (current) use of anticoagulants: Secondary | ICD-10-CM | POA: Insufficient documentation

## 2010-11-22 NOTE — Medication Information (Signed)
Summary: ccr-lr  Anticoagulant Therapy  Managed by: Vashti Hey, RN Referring MD: Riley Kill PCP: Dr Leighton Ruff MD: Dietrich Pates MD, Molly Maduro Indication 1: Atrial Fibrillation (ICD-427.31) Lab Used: [facility] [place] Site: [place] INR POC 1.5  Dietary changes: no    Health status changes: no    Bleeding/hemorrhagic complications: no    Recent/future hospitalizations: no    Any changes in medication regimen? no    Recent/future dental: no  Any missed doses?: no       Is patient compliant with meds? yes       Allergies: No Known Drug Allergies  Anticoagulation Management History:      The patient is taking warfarin and comes in today for a routine follow up visit.  Positive risk factors for bleeding include an age of 75 years or older and presence of serious comorbidities.  The bleeding index is 'intermediate risk'.  Positive CHADS2 values include History of CHF and Age > 46 years old.  The start date was 06/16/2006.  His last INR was 3.49.  Anticoagulation responsible provider: Dietrich Pates MD, Molly Maduro.  INR POC: 1.5.  Cuvette Lot#: [id number].  Exp: 05/2011.    Anticoagulation Management Assessment/Plan:      The patient's current anticoagulation dose is Warfarin sodium 2 mg tabs: Use as directed by Anticoagualtion Clinic.  The target INR is 2 - 3.  The next INR is due 11/24/2010.  Anticoagulation instructions were given to patient.  Results were reviewed/authorized by Vashti Hey, RN.  He was notified by Vashti Hey RN.         Prior Anticoagulation Instructions: INR 1.6 Take coumadin 1 1/2 tablets tonight and tomorrow night then increase dose to 1 tablet once daily   Current Anticoagulation Instructions: INR 1.5 Take coumadin 2 tablets tonight, 1 1/2 tomorrow night then increase dose to 1 tablet once daily except 1 1/2 tablets on Mondays and Thursdays

## 2010-11-24 ENCOUNTER — Ambulatory Visit (INDEPENDENT_AMBULATORY_CARE_PROVIDER_SITE_OTHER): Payer: Medicare Other | Admitting: *Deleted

## 2010-11-24 DIAGNOSIS — I4891 Unspecified atrial fibrillation: Secondary | ICD-10-CM

## 2010-11-24 DIAGNOSIS — Z7901 Long term (current) use of anticoagulants: Secondary | ICD-10-CM

## 2010-11-24 LAB — POCT INR: INR: 2

## 2010-12-01 NOTE — Cardiovascular Report (Signed)
Summary: Office Visit   Office Visit   Imported By: Roderic Ovens 11/21/2010 11:27:14  _____________________________________________________________________  External Attachment:    Type:   Image     Comment:   External Document

## 2010-12-02 ENCOUNTER — Other Ambulatory Visit: Payer: Self-pay | Admitting: Cardiology

## 2010-12-12 ENCOUNTER — Ambulatory Visit (INDEPENDENT_AMBULATORY_CARE_PROVIDER_SITE_OTHER): Payer: Medicare Other | Admitting: *Deleted

## 2010-12-12 DIAGNOSIS — Z7901 Long term (current) use of anticoagulants: Secondary | ICD-10-CM

## 2010-12-12 DIAGNOSIS — I4891 Unspecified atrial fibrillation: Secondary | ICD-10-CM

## 2010-12-12 LAB — POCT INR: INR: 2.1

## 2011-01-12 ENCOUNTER — Ambulatory Visit (INDEPENDENT_AMBULATORY_CARE_PROVIDER_SITE_OTHER): Payer: Medicare Other | Admitting: *Deleted

## 2011-01-12 DIAGNOSIS — Z7901 Long term (current) use of anticoagulants: Secondary | ICD-10-CM

## 2011-01-12 DIAGNOSIS — I4891 Unspecified atrial fibrillation: Secondary | ICD-10-CM

## 2011-01-17 NOTE — Assessment & Plan Note (Signed)
 HEALTHCARE                         ELECTROPHYSIOLOGY OFFICE NOTE   WINNER, VALERIANO                      MRN:          [id number]  DATE:04/20/2008                            DOB:          08/13/1923    Mr. Matheson returns today for followup.  He is a very pleasant elderly  male with a history of ischemic cardiomyopathy, congestive heart  failure, and left bundle-branch block who is status post insertion of a  biventricular ICD as part of her major CRT protocol.  He returns today  for followup.  He has been stable.  He denies chest pain.  He denies  shortness of breath.  He continues to be active.  His heart failure  remains class II.   His medications include,  1. Coumadin as directed.  2. Carvedilol 3.125 twice daily.  3. Simvastatin 20 a day.  4. Synthroid 112 mcg daily.  5. Multiple vitamin.  6. Aspirin 81 mg daily.   On physical exam, he is a pleasant well-appearing elderly man in no  distress.  Blood pressure was 114/68, pulse was 44 and regular,  respirations were 18, weight was 187 pounds.  Neck revealed no jugular  venous distention.  Lungs, clear bilateral auscultation.  No wheezes,  rales, or rhonchi were present.  Extremities demonstrate no edema.   Interrogation of his defibrillator demonstrates a Guidant Renewal 3 and  P and R waves were 1.8 and 6.6 respectively.  The pacing threshold was  0.8 and 0.5 in the RA, 0.8 and 0.4 in the RV and LV.  Impedance was 632  in the atrium, 518 in the RV, 725 in the LV.  There were 3 episodes of  atrial fibrillation, the longest of which was 6 seconds.   IMPRESSION:  1. Ischemic cardiomyopathy.  2. Congestive heart failure.  3. Status post biventricular implantable cardioverter-defibrillator      insertion.   DISCUSSION:  Overall, Mr. Goldman is stable.  His defibrillator is  working normally.  His heart failure is well controlled.  He will  continue his current medications.  I will see  him back in several  months.     Doylene Canning. Ladona Ridgel, MD  Electronically Signed    GWT/MedQ  DD: 04/20/2008  DT: 04/21/2008  Job #: [id number]

## 2011-01-17 NOTE — H&P (Signed)
[name], [name]               ACCOUNT NO.:  [id number]   MEDICAL RECORD NO.:  [id number]          PATIENT TYPE:  INP   LOCATION:  A313                          FACILITY:  [facility]   PHYSICIAN:  Dorris Singh, DO    DATE OF BIRTH:  11-24-1922   DATE OF ADMISSION:  09/02/2007  DATE OF DISCHARGE:  LH                              HISTORY & PHYSICAL   CHIEF COMPLAINT:  The patient is an 77 year old male whose chief  complaint is left lower extremity pain.   HISTORY OF PRESENT ILLNESS:  The patient stated about 14 days ago he  went hunting where he fell on his gun handle and on his left side and  had a contusion to the lateral part of his left thigh. He went to be  seen in an Urgent Care and then subsequently in the ER on December 24.  He had x-rays done which did not show any bony abnormalities but he was  diagnosed with a contusion of the vastus lateralis and sent home on  analgesics. He stated that this dye has not improved since that time. It  has continued to swell and harden in that area and the pain has also  increased. The onset was gradual. He does admit to continual bruising up  and down this left leg as well but the bruising has spread. The patient  currently is on Coumadin and is concerned about that too. He states that  anything that he does aggravates it like moving, standing and walking.  There is nothing that relieves it.   PAST MEDICAL HISTORY:  Coronary artery disease, hypertension and  hypothyroid.   PAST SURGICAL HISTORY:  Significant for CABG, implantation of an  automatic cardiac defibrillator.   SOCIAL HISTORY:  Nonsmoker, nondrinker, no drug abuse and lives with his  spouse.   He has no known allergies to any medications. Currently he is on:  1. Aspirin 81 mg a day.  2. Coreg 25 mg a day.  3. Synthroid 112 mcg daily.  4. Multivitamin 1/2-tab daily.  5. Hydrocodone/APA 5/325 twice a day.  6. Warfarin 5 mg 1 tablet Tuesday and Friday, 1/2 tablet Monday,     Wednesday, Thursday, Saturday, Sunday.  7. Carvedilol/Coreg 3.125 mg twice a day.   REVIEW OF SYSTEMS:  CONSTITUTIONAL:  Positive for some weakness of that  leg. EYES:  No scleral icterus or changes in vision. SKIN:  Positive  jaundice of the skin where bruising is. EARS/NOSE/THROAT:  Noncontributory. HEART:  No chest pain or palpitations. LUNGS:  No  wheezes or shortness of breath. ABDOMEN:  No nausea, vomiting, fever or  chills. EXTREMITIES:  Positive pain of the left leg. Positive ecchymosis  and positive for swelling and trauma.   VITAL SIGNS:  His temperature is 98.7, pulse 72, respirations 20, blood  pressure 112/67.  GENERAL:  This is a well-developed, well-nourished, 77 year old,  Caucasian male who is in no acute distress.  HEAD:  Normocephalic, atraumatic.  EYES:  PERRLA, EOMI. No scleral icterus or conjunctival injection.  EARS:  TM is visualized bilaterally.  MOUTH:  Teeth are in  fair repair. No erythema or exudate noted.  NECK:  Supple, nontender, full range of motion, no lymphadenopathy.  HEART:  Regular rate and rhythm.  CHEST:  Positive placemen of a defibrillator device.  LUNGS:  Clear to auscultation bilaterally.  ABDOMEN:  Soft, nontender, nondistended. Bowel sounds x4.  EXTREMITIES:  Right extremity positive pulse, no swelling. Left  extremity, there is diffuse ecchymosis in various stages of healing.  Also on the lateral aspect of the thigh is a raised swollen area that is  hard and tender to the touch.   LABORATORY DATA:  White count is 12.6, hemoglobin 11.2, hematocrit 33.4,  platelet count 276. His coag studies, his INR is 2.4, PT is 48.  Chemistries, sodium 138, potassium 4.4, chloride 102, CO2 29, glucose  120, BUN 25, creatinine 1.13 and he has had blood cultures drawn with is  leukocytosis.   ASSESSMENT:  1. Contusion of the left thigh.  2. Leukocytosis.  3. Coronary artery disease.   PLAN:  Admit the patient to the service of [facility]. Dr.  Romeo Apple was  called by Dr. Colon Branch, the ED doctor, regarding the case. Will go ahead  and consult him. The concern is ruling out whether or not the patient  has compartmental syndrome of this thigh based on the increasing  swelling and there is no resolution of symptoms, symptoms have gotten  worse since the accident. Also he has an elevated white count. Will go  ahead and treat him with antibiotics at this point in time. Will  continue to monitor him and change therapy appropriately. He is  currently anticoagulated. Will keep him on his current Coumadin level  and then also will place the patient on GI prophylaxis as well. Will  place him on his home medications and will continue to monitor him and  change if necessary any of the current management.      Dorris Singh, DO  Electronically Signed     CB/MEDQ  D:  09/02/2007  T:  09/02/2007  Job:  [id number]

## 2011-01-17 NOTE — Procedures (Signed)
NAMETHAILAN, SAVA               ACCOUNT NO.:  [id number]   MEDICAL RECORD NO.:  [id number]          PATIENT TYPE:  INP   LOCATION:  A313                          FACILITY:  [facility]   PHYSICIAN:  Gerrit Friends. Dietrich Pates, MD, FACCDATE OF BIRTH:  [birth year]   DATE OF PROCEDURE:  09/04/2007  DATE OF DISCHARGE:                                ECHOCARDIOGRAM   CLINICAL DATA:  This is an 77 year old gentleman with prior CABG surgery  and a history of atrial fibrillation.  1. Technically adequate echocardiographic study.  2. Left atrial size at the upper limit of normal; normal right atrium      and right ventricle; pacing wires visualized in the right atrium      and right ventricle.  3. Normal diameter of the proximal ascending aorta; mild calcification      of the wall.  4. Normal trileaflet aortic valve.  5. Normal tricuspid valve; physiologic regurgitation; normal estimated      RV systolic pressure.  6. Normal pulmonic valve and proximal pulmonary artery.  7. Normal mitral valve; mild regurgitation.  8. Mild left ventricular dilatation; and the anteroseptal region      extending anterolaterally and the apex are thin and akinetic.      Overall function is moderately to severely impaired with an      estimated ejection fraction of 0.25 - 0.30.  9. Normal IVC.      Gerrit Friends. Dietrich Pates, MD, [facility]  Electronically Signed     RMR/MEDQ  D:  09/04/2007  T:  09/05/2007  Job:  [id number]

## 2011-01-17 NOTE — Group Therapy Note (Signed)
NAMEYECHESKEL, KUREK               ACCOUNT NO.:  [id number]   MEDICAL RECORD NO.:  [id number]          PATIENT TYPE:  INP   LOCATION:  A313                          FACILITY:  [facility]   PHYSICIAN:  Skeet Latch, DO    DATE OF BIRTH:  Mar 08, 1923   DATE OF PROCEDURE:  09/03/2007  DATE OF DISCHARGE:                                 PROGRESS NOTE   SUBJECTIVE:  This is an 77 year old male who presented with left leg  hematoma.  The patient, approximately 14 days ago,  went hunting, fell  on his gun, hand on the left thigh and had a contusion on the lateral  part of his left thigh.  The patient has been on Coumadin, and this  progressed into a large hematoma that extended from his thigh all the  way down to his foot.  X-rays were done and do not show any bony  abnormality.  He was diagnosed with contusion of the vastus lateralis  and sent home on analgesics. The patient said the pain medicine was not  working, and his leg became even more swollen and hard and decided he  should come to the emergency room for evaluation.  The patient was  admitted.  Cardiology was consulted in view of his coronary artery  disease and history of bypass grafting, and they recommended the patient  be off of all blood thinners of any kind at this time.  Also,  Orthopedics was consulted, and they felt that no need for surgical  intervention at this time and just to follow him closely, being off any  blood thinners, his Coumadin, and apply heat to the area.  Today, the  patient states that his leg feels a lot better with warm compresses, and  he is doing well at this time.   OBJECTIVE:  VITAL SIGNS:  Temperature is 98.2, respirations 20, heart  rate is 78, blood pressure is 98/67.  CARDIOVASCULAR:  He has an ejection murmur, left sternal border.  Regular rate and rhythm.  LUNGS:  Clear to auscultation bilaterally.  No rales, rhonchi, wheezing.  ABDOMEN:  Soft, nontender, nondistended.  No rigidity, guarding,  rebound  tenderness.  EXTREMITIES:  Left lower extremity has diffuse ecchymosis, various  stages of healing noted.  His left knee is swollen, and his left lateral  malleolus is firm to the touch.   LABORATORY DATA:  Hemoglobin 10.0, hematocrit 29.8.   ASSESSMENT AND PLAN:  1. A large left hematoma.  The patient will continue to be off all      anticoagulant.  Will continue to apply heat to area and just follow      closely.  The patient has a venous Doppler scheduled on that left      lower extremity.  The patient was given vitamin K via Cardiology.      Ortho was consulted, and Dr. Fredia Beets is currently on vacation at      this time.  2. The patient did have leukocytosis.  The patient is empirically on      antibiotics, will continue at this time pending any culture  results.  At this time all his other vitals seem to be stable.      Will continue to monitor closely.      Skeet Latch, DO  Electronically Signed    SM/MEDQ  D:  09/03/2007  T:  09/04/2007  Job:  [id number]

## 2011-01-17 NOTE — Assessment & Plan Note (Signed)
[facility]                         ELECTROPHYSIOLOGY OFFICE NOTE   CHAYDEN, GARRELTS                      MRN:          [id number]  DATE:11/03/2008                            DOB:          1923/07/14    Mr. Tsuchiya returns today for followup.  He is a very pleasant elderly  male with an ischemic cardiomyopathy, congestive heart failure left  bundle-branch block, status post BiV ICD insertion back in May 2007.  The patient returns today for followup.  He had no specific complaints  today.  He denies heart failure symptoms, fatigue, weakness, or other  complaints.  He is able to walk around hilly terrain without particular  difficulty as long he does not go too fast.  He has had no intercurrent  IC therapies.   CURRENT MEDICATIONS:  1. Multivitamin.  2. Simvastatin 20 a day.  3. Synthroid 112 mcg daily.  4. Aspirin 81 a day.  5. Coreg 3.125 twice daily.  6. Coumadin as directed.   PHYSICAL EXAMINATION:  GENERAL:  He is a pleasant, well-appearing,  elderly man, in no distress.  VITAL SIGNS:  Blood pressure was 122/80, the pulse 70 and regular,  respirations were 18, the weight was 191 pounds.  NECK:  No jugular venous distention.  LUNGS:  Clear bilaterally to auscultation.  No wheezes, rales, or  rhonchi are present.  No increased work of breathing.  CARDIOVASCULAR:  Regular rate and rhythm.  Normal S1 and S2.  ABDOMEN:  Soft, nontender.  EXTREMITIES:  Demonstrated no cyanosis, clubbing, or edema.   Interrogation of his defibrillator demonstrates a Guidant contact H-170.  P-waves were 1.5.  He has no underlying escape.  Impedance 640 in the A,  530 in the RV, 664 in the LV.  Threshold volt of 0.5 in the A, 0.8 at  0.4 in the RV, and 0.8 at 0.4 in the LV.  The battery voltage was 2.94  volts.  He was 99% A paced rather 99% V paced.   IMPRESSION:  1. Ischemic cardiomyopathy.  2. Congestive heart failure.  3. Left bundle-branch block.  4.  Status post biventricular implantable cardioverter-defibrillator      insertion.   DISCUSSION:  They returned the patient's outputs down to increase his  battery longevity.  Otherwise, no changes were made.  I have made no  changes to his medical therapy as well.  We will plan to see the patient  back in the office for followup in several months.     Doylene Canning. Ladona Ridgel, MD  Electronically Signed    GWT/MedQ  DD: 11/03/2008  DT: 11/04/2008  Job #: [id number]   cc:   Theressa Millard, M.D.

## 2011-01-17 NOTE — Letter (Signed)
March 19, 2007    Tasia Catchings, M.D.  [address]  [address]  [place], [place] [address]   RE:  [name], [name]  MRN:  [id number]  /  DOB:  [birth year]   Dear Rosanne Ashing:   It was a pleasure to see Mohannad Olivero today regarding his upcoming  colonoscopy.  He is an 77 year old gentleman with apparently no GI  symptoms, but who has never had colonoscopy, who is referred by Dr.  Theressa Millard to you for that, and this is upcoming.  The question was,  at the new outpatient facility up on [facility], how it is that we  might manage his defibrillator.   He has a history of ischemic heart disease with prior bypass surgery.  He has depressed left ventricular function, but is functionally quite  fit.  He underwent CRT implantation as part of the MADIT-CRT trial.   He has intercurrently been diagnosed with atrial fibrillation for which  he takes Coumadin.   PAST MEDICAL HISTORY:  1. In addition to the above, notable for treated hypothyroidism.  2. Dyslipidemia.  3. Hypotension.   SOCIAL HISTORY:  He is a retired Engineer, site.  He lives with his  wife.  He does not use cigarettes, alcohol, or recreational drugs.   MEDICATIONS:  1. Coumadin.  2. Simvastatin 20.  3. Coreg 3.125.  4. Levothyroxine 112.  5. Aspirin 81.   EXAMINATION:  He is a well-developed, well-nourished elderly male  appearing his stated age of 85.  His blood pressure is 114/62, pulse was 48.  Multiple lower blood  pressures in the 80s and 90s are recorded in the chart.  HEENT:  Demonstrated no icterus or xanthoma.  The neck veins are flat.  Carotids are brisk and equal without bruits.  BACK:  Without kyphosis or scoliosis.  LUNGS:  Clear.  HEART:  Sounds were regular with a displaced PMI.  ABDOMEN:  Soft with active bowel sounds.  EXTREMITIES:  Without peripheral edema.  NEUROLOGIC:  Grossly normal.  SKIN:  Warm and dry.   IMPRESSION:  1. Coronary artery disease.      a.     Status post myocardial  infarction.      b.     Status post coronary artery bypass grafting.      c.     Ejection fraction of 20%.  2. Right bundle branch block.  3. Status post cardiac resynchronization therapy implantation as part      of MADIT-CRT.  4. Paroxysmal atrial fibrillation.  5. Hypotension precluding angiotensin-converting enzyme inhibitor      therapy or further up-titration of his beta blockers.   Mr. Keo, Schirmer, is stable from a cardiac point of view as relates to  his colonoscopy, his defibrillator will need to be inactivated.  Typically, this is done with programming.  Magnets can be used if  necessary.   If there is anything further we can do to assist, please do not hesitate  to contact us.    Sincerely,      Duke Salvia, MD, [facility]  Electronically Signed    SCK/MedQ  DD: 03/19/2007  DT: 03/19/2007  Job #: [id number]   CC:   Arturo Morton. Riley Kill, MD, [facility]  Theressa Millard, M.D.

## 2011-01-17 NOTE — Assessment & Plan Note (Signed)
[facility]                            CARDIOLOGY OFFICE NOTE   NOLYN, SWAB                      MRN:          [id number]  DATE:09/15/2008                            DOB:          05-22-1923    Mr. Lambert Mody is in for followup.  He is doing quite well.  He denies any  progressive shortness of breath.  He had previously had a rise in BUN  and creatinine on ACE inhibitor, possibly due to volume depletion.  His  blood pressure has been stable.  Repeat echo was done today.  Preliminary estimation suggests reduced overall LV function.  His wife  says that there is no way he could possibly do any better than he is  doing currently on his current regimen.   MEDICATIONS:  1. Multivitamin one-half daily.  2. Simvastatin 20 mg daily.  3. Levothyroxine 112 mcg daily.  4. Aspirin 81 mg daily.  5. Coreg 3.125 b.i.d.  6. Coumadin as directed.   PHYSICAL EXAMINATION:  VITAL SIGNS:  Blood pressure is 131/75 and pulse  52.  LUNG:  Fields clear.  CARDIAC:  Rhythm is regular.  There is an S4 gallop.   IMPRESSION:  1. Ischemic cardiomyopathy.  2. Congestive heart failure.  3. Status post biventricular implantable cardio-defibrillator.  4. Prior myocardial infarction with subsequent coronary artery bypass      graft surgery.   PLAN:  1. Return to clinic in 4 months.  2. Continue current medical regimen.  3. He will have a pacer followup next month in .     Arturo Morton. Riley Kill, MD, [facility]  Electronically Signed    TDS/MedQ  DD: 09/15/2008  DT: 09/16/2008  Job #: [id number]

## 2011-01-17 NOTE — Discharge Summary (Signed)
NAMEKINLEY, FERRENTINO               ACCOUNT NO.:  [id number]   MEDICAL RECORD NO.:  [id number]          PATIENT TYPE:  INP   LOCATION:  A313                          FACILITY:  [facility]   PHYSICIAN:  Osvaldo Shipper, MD     DATE OF BIRTH:  1923-02-14   DATE OF ADMISSION:  09/02/2007  DATE OF DISCHARGE:  01/01/2009LH                               DISCHARGE SUMMARY   PRIMARY MEDICAL DOCTOR:  [facility].   He also follows with Arturo Morton. Riley Kill, MD, [facility], from [facility].   DISCHARGE DIAGNOSES:  1. Left leg swelling and hematoma as a result trauma and being on      anticoagulation.  2. History of coronary artery disease, stable.  3. History of atrial fibrillation, stable.  4. 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   Please see H&P dictated by Dr. Elige Radon regarding patient's presenting  illness.   BRIEF HOSPITAL COURSE:  Briefly, this is an 77 year old Caucasian male  who has a history of coronary artery disease, atrial fibrillation and  hypothyroidism who was in his usual state of health until about two  weeks ago when he went hunting, he fell on his gun handle on his left  side and as a result, sustained a contusion to his left thigh.  He  underwent x-rays of his thigh apparently which were all negative for any  trauma.  However, the swelling started increasing.  Patient started  increasing so he returned to the emergency department where he was found  to have hematoma on his left thigh.  Patient was admitted to the  hospital for further evaluation and observation.  He was seen by Dr.  Romeo Apple who ruled out compartment syndrome.  Patient was taken off of  anticoagulation and his INR slowly reduced to less than 2.  Patient's  swelling has come down.  He is asymptomatic.  He is feeling much better.  He has been able to ambulate.  He is able to flex and extend his hip,  although he is still not able to flex his knee as yet.   His examination reveals that  his left leg is getting better.  Other  medical issues are all stable.  This morning he is feeling better.  He  is keen on going home.  His vital signs are all pretty stable.  His  examination revealed decreased swelling in the left leg.  His lab shows  hemoglobin has been stable.  His INR is down to 1.4 today.  Everything  else is quite stable so he is stable for discharge.   DISCHARGE MEDICATIONS:  1. He has been told to discontinue Coumadin unless told by his      cardiologist to resume.  He needs to wait at least two weeks before      he can resume his Coumadin.  2. He may continue his aspirin 81 mg daily.  3. Coreg 3.125 mg b.i.d.  4. Synthroid 112 mcg daily.  5. Vicodin 5/325 q.4h. p.r.n.  6. He is also on Zocor which he now is to continue as before.  FOLLOW UP:  With Dr. Riley Kill in two to three weeks.  Discuss resumption  of Coumadin with his PMD as needed.   He can apply warm compresses to his left leg to relieve any pain that he  may have, otherwise no other wound care required.  I have told him that  the bruising will take a few weeks to completely resolve.   CONSULTATIONS:  [facility], who made recommendations regarding  his anticoagulation.  He also had a venous Doppler which was negative  for DVT.   TOTAL TIME OF DISCHARGE:  35 minutes.      Osvaldo Shipper, MD  Electronically Signed     GK/MEDQ  D:  09/05/2007  T:  09/05/2007  Job:  [id number]   cc:   Arturo Morton. Riley Kill, MD, [facility]  [address]  [address]  [place]  [place] [address]

## 2011-01-17 NOTE — Assessment & Plan Note (Signed)
[facility]                            CARDIOLOGY OFFICE NOTE   SOMNANG, MAHAN                      MRN:          [id number]  DATE:01/13/2008                            DOB:          [birth year]    [name] is in for a followup visit.  In general, he is doing  extremely well.  [name] season just finished although he was not able to  bag a [name] this year.  He has recently seen Dr. Ladona Brooks, and his BiV  ICD has been working without difficulty.  He is tolerating his medicines  without problems.   CURRENT MEDICATIONS:  1. Multivitamin 1/2 daily.  2. Simvastatin 20 mg daily.  3. Levothyroxine 112 mcg daily.  4. Aspirin 81 mg daily.  5. Coreg 3.125 mg p.o. b.i.d.  6. Coumadin as directed.   PHYSICAL:  Blood pressure is 114/80, pulse is 62.  The lung fields are clear.  Cardiac rhythm is regular.  There is a minimal systolic ejection murmur.   Electrocardiogram demonstrates atrial tracking with ventricular pacing.   IMPRESSION:  1. Prior myocardial infarction and subsequent coronary bypass graft      surgery.  2. Ischemic cardiomyopathy.  3. Paroxysmal atrial fibrillation with a prior history of mode      switching, currently none.  4. History of MADIT CRT with BiV ICD.   PLAN:  1. Return to clinic in 6 months.  2. Continue current medical regimen.   ADDENDUM:  He will have followup lipids with Dr. [name].     [name]. Adam Kill, MD, [facility]  Electronically Signed    TDS/MedQ  DD: 01/13/2008  DT: 01/13/2008  Job #: [id number]   cc:   [name], M.D.

## 2011-01-17 NOTE — Assessment & Plan Note (Signed)
[facility]                            CARDIOLOGY OFFICE NOTE   JOSHUAN, BOLANDER                      MRN:          [id number]  DATE:07/20/2008                            DOB:          [birth year]    Mr. Lambert Mody is in for followup.  From a clinical standpoint, he is stable.  He actually feels well and remains very active.   His medications include multivitamin one-half daily, simvastatin 20 mg  daily, levothyroxine 112 mcg daily, aspirin 81 mg daily, Coreg 3.125  b.i.d., and Coumadin as directed.   On physical, he is alert and oriented in no distress.  Blood pressure  121/69, pulse 48.  Lung fields clear.  Cardiac rhythm is regular.  There  is an S4 gallop.  He has a device in place.   The electrocardiogram demonstrates atrial tracking, ventricular pacing.   Overall, Mr. Davidow appears stable.  He feels well.  We previously had  him on an angiotensin-converting enzyme inhibitor and he developed  increased BUN and creatinine thought to be related to in part volume  depletion.  However, his blood pressure is stable and he might be able  to tolerate a higher dose of beta-blocker such as carvedilol.  Since he  is doing so well, I am a little anxious about changing that, and we will  get a repeat 2-D echocardiogram to reevaluate his status.  His  laboratory studies are being done by Dr. Earl Gala.  We will see the  patient back in 3-4 weeks to make some adjustments based on his echo  findings.     Arturo Morton. Riley Kill, MD, [facility]  Electronically Signed    TDS/MedQ  DD: 07/20/2008  DT: 07/21/2008  Job #: [id number]

## 2011-01-17 NOTE — Consult Note (Signed)
NAMEPHILOPATEER, STRINE               ACCOUNT NO.:  [id number]   MEDICAL RECORD NO.:  [id number]          PATIENT TYPE:  INP   LOCATION:  A313                          FACILITY:  [facility]   PHYSICIAN:  Peter C. Eden Emms, MD, FACCDATE OF BIRTH:  May 08, 1923   DATE OF CONSULTATION:  DATE OF DISCHARGE:                                 CONSULTATION   Mr. Mathew is as an 84-year patient admitted for left thigh hematoma.  We were asked to consult on him in regards to his anticoagulation   The patient sees Dr. Riley Kill on a regular basis.   He was last seen on May 02, 2007.   The patient has a history of ischemic cardiomyopathy with an EF of 25%.  He had previous bypass in 2003.   He has been on chronic Coumadin therapy.  He has a history of PAF after  his CABG. There was also question of mural apical thrombus on previous 2-  D echocardiogram   His last echo however was done on Jan 17, 2007.   There was diffuse hypokinesis and there was a question of mural apical  thrombus. At that time, I had recommended a follow-up MRI to further  elucidate this but I do not believe it was done   The patient basically 16 days ago fell. He was carrying a gun. The butt  of the gun hit his thigh. He was seen at Urgent Care in the emergency  room and had continued swelling. On admission his INR was 2.4.  Unfortunately he was given additional Lovenox and his INR went up to  2.8.  He has a very tense hematoma in the thigh and orthopedics have  been consulted in regards to compartment syndrome.   The leg had been getting worse for the last 14 days. He seems to think  it has gotten slightly better in the last 2 days.   He has good sensation in his toes so he was unable to lift his leg for  the better part of a week and half. He has some mobility in it now but  continues to have significant pain.  He was given some sort of narcotic  pain pill but ran out of these pills and represented to the emergency  room.   In regards to his heart, he has not had any significant chest pain, his  AICD has not fired, there has been no PND or orthopnea.  He has chronic  lower extremity edema from varicosities.  He has not had any PND, or  orthopnea.  His weight has been stable and there has been no recent  hospitalizations for congestive heart failure or rapid atrial  fibrillation.   PAST MEDICAL HISTORY:  Remarkable for previous CABG with ischemic  cardiomyopathy of 25%, question mural apical thrombus, need for follow-  up contrast echo. The patient cannot have an MRI due to his BiV AICD,  history of previous MI with V-fib arrest, history of paroxysmal atrial  fibrillation on Coumadin therapy, hypertension, hypercholesterolemia,  and hypothyroidism.   The patient lives in [place].  He has three kids  and six  grandchildren.  His wife lives with him. He is a retired Chartered loss adjuster.  He has active hunting and gardening and was actually outside with his  gun when he fell. He does not smoke or drink.  He has no known  allergies.   MEDICATIONS PRIOR TO ADMISSION:  An aspirin a day, warfarin, carvedilol  3.125 b.i.d., Synthroid 112 mcg  a day, Hydrocodone. Here in the  hospital he was placed on Lovenox which will be stopped and also given  Levaquin 500 mg IV.   PHYSICAL EXAMINATION:  An elderly white male in no distress.  His blood pressure is 100/57, respiratory rate is 20 and pulse is 78 and  regular, temperature is 98.2, weight is 85.6 kg.  HEENT:  Unremarkable.  Carotids normal without bruit. No  lymphadenopathy, thyromegaly, or JVP elevation.  No bruits.  LUNGS:  Clear, good diaphragmatic motion.  No wheezing. AICD in  position on the left clavicle.  S1, S2 with an MR murmur.  PMI is increased.  ABDOMEN:  Protuberant.  Bowel sounds positive.  No AAA.  No tenderness,  no hepatosplenomegaly, no hepatojugular reflux.  Distal pulses are intact.  He has marked varicosities in the lower  extremities, right  greater than left.  He has a tense hematoma over the  lateral aspect of the left thigh extending to the knee area.  PT pulses  were +2. There is some dependent discoloration of the legs from his  venous insufficiency.   His lab work was remarkable for an INR of 2.4 which increased to 2.8 on  Lovenox.   His AICD was interrogated and there was no mode switching since the  middle of November.  He is currently in sinus rhythm and the AICD is  functioning normally   The remainder of his lab work is remarkable for BNP showing potassium  3.91, BUN 25, creatinine 1.15 and CBC showing a hematocrit 29.4.  Diagnostic chest showed cardiomegaly with no current congestive heart  failure.   IMPRESSION:  1. Paroxysmal atrial fibrillation maintaining sinus rhythm by      interrogation of device. Stop Coumadin altogether. 2 mg of p.o.      vitamin K, try to prevent compartment syndrome and follow PT in the      morning.  2. Significant venous insufficiency with varicosities. In the setting      of a left thigh hematoma and the patient being at bedrest more than      usual, I think it would be important to get a baseline venous      duplex to make sure that there is no developing clot. This would      make a handling his situation extremely difficult. It would be nice      to mobilize the patient as soon as possible. Since he has not had      any mode switching or PAF on his AICD, I would like to keep him off      all blood thinners including Coumadin and Lovenox for 2 weeks.  3. History of ischemic cardiomyopathy. Question mural apical thrombus.      The mural apical thrombus should be stable.  It is chronic and has      a low embolic risk.  Will do 2-D echocardiogram to further assess      this. He  cannot have an MRI due to his AICD. He will continue his      current medication.  He may need  p.r.n. Lasix particularly if he      needs a blood transfusion.  He currently appears euvolemic.       Checking a BNP in the morning would be worthwhile. His blood      pressure is fairly on the low side and I would not up titrate his      medicines at this time. Will continue his beta blocker.  4. Left thigh hematoma. Orthopedic to be consulted. Continue      conservative care with ice and possible follow-up ultrasound versus      CT scan to rule out compartment syndrome.  5. Anemia secondary to left thigh hematoma. Follow. Would consider      transfusion for hematocrit less than 28 given his known coronary      artery disease.   I would be happy to follow the patient along here in the hospital.      Theron Arista C. Eden Emms, MD, [facility]  Electronically Signed     PCN/MEDQ  D:  09/03/2007  T:  09/03/2007  Job:  [id number]

## 2011-01-17 NOTE — Letter (Signed)
May 02, 2007    Theressa Millard, M.D.  [address] [place], [place] [address]   RE:  [name], [name]  MRN:  [id number]  /  DOB:  01/29/1923   Dear Rosanne Ashing:   I had the pleasure of seeing [name] in the office today in a  follow-up visit.  From a cardiac standpoint, he has been stable.  He has  not been having any chest pain.  He feels well.  He has not had any  syncope.  As you know, we had talked about the possibility of putting  him on an ARB or ace inhibitor, but he has done so well with class I-II  symptoms, that we elected not to do any of that.  I am pleased with how  he is doing overall.   PHYSICAL EXAMINATION:  VITAL SIGNS:  Today, the blood pressure is 114/80  with pulse of 62.  LUNGS:  The lung fields are clear,  CARDIAC:  Rhythm is regular.  EXTREMITIES:  Reveal no edema.   I am pleased with how he has done, and I plan to see him back in  followup in 6 months.  Should he have any problems in the interim,  please do not hesitate to let know.  We agreed that he would not go on  ARBs at the present time.    Sincerely,      Arturo Morton. Riley Kill, MD, [facility]  Electronically Signed    TDS/MedQ  DD: 05/02/2007  DT: 05/03/2007  Job #: [id number]

## 2011-01-17 NOTE — Assessment & Plan Note (Signed)
[facility]                         ELECTROPHYSIOLOGY OFFICE NOTE   AARO, MEYERS                      MRN:          [id number]  DATE:09/19/2007                            DOB:          August 05, 1923    [name] returns today for follow-up.  He is a very pleasant elderly  man with a history of an ischemic cardiomyopathy, history of VF arrest  status post BiV ICD insertion who returns today for follow-up.  He is  part of a MADIT CRT study.  He notes in the interim he was out hunting  and fell and landed on his gun stock and subsequently developed a large  hematoma.  He unfortunately stayed on Coumadin for several days before  finally stopping Coumadin.  Over the last several weeks, his large left  leg hematoma has resolved and improved.  He had no other specific  complaints today.   MEDICATIONS:  1. Coreg 3.125 twice daily.  2. Aspirin 81 daily.  3. Synthroid 112 mic daily.  4. Simvastatin 20 mg daily.  5. Multiple vitamins.   ALLERGIES:  He has been intolerant of ACE INHIBITORS in the past.   PHYSICAL EXAMINATION:  GENERAL:  He is a pleasant, well-appearing  elderly man in no distress.  VITAL SIGNS:  Blood pressure 114/59, pulse 60 and regular, respirations  were 18.  Weight was 192 pounds.  NECK:  No jugular distention.  LUNGS:  Clear bilaterally to auscultation.  No wheezes, rales or rhonchi  are present.  No increased work of breathing was present.  CARDIOVASCULAR:  Regular rate and rhythm with normal S1 and a split S2.  ABDOMEN:  Soft, nontender.  EXTREMITIES:  Resolving left thigh hematoma.   Interrogation of his defibrillator demonstrates a Guidant Contak H-170.  P and R waves were 2 and 8 respectively.  The impedance is 592 in the  atrium, 518 in the right ventricle and 704 in the left ventricle.  Threshold was a volt of 0.5 in the A and the RV and 0.8 at 0.4 in the  LV.  Battery voltage was 3.17 volts.  Since he was last  checked in  November, there are no intercurrent ICD therapies and no mode switching  episodes.   IMPRESSION:  1. Ischemic cardiomyopathy.  2. History of ventricular fibrillation (VF).  3. Chronic Coumadin therapy, now discontinued.  4. Left leg hematoma.   DISCUSSION:  Overall, Mr. Wiseman is stable.  His hematoma is not  completely resolved, and I have recommend that he wait 1 month before  starting his Coumadin therapy back.  I will see him in the office in  several months.     Doylene Canning. Ladona Ridgel, MD  Electronically Signed    GWT/MedQ  DD: 09/19/2007  DT: 09/19/2007  Job #: [id number]

## 2011-01-17 NOTE — Group Therapy Note (Signed)
NAMEJACOBB, ALEN               ACCOUNT NO.:  [id number]   MEDICAL RECORD NO.:  [id number]          PATIENT TYPE:  INP   LOCATION:  A313                          FACILITY:  [facility]   PHYSICIAN:  Vickki Hearing, M.D.DATE OF BIRTH:  26-Aug-1923   DATE OF PROCEDURE:  09/04/2007  DATE OF DISCHARGE:                                 PROGRESS NOTE   CONSULTATION FOLLOW-UP   The patient has a left thigh hematoma.  Swelling has gone down.  The  patient's pain has decreased.  The size of the thigh has decreased as  well as increased motion in his left knee.  No signs of any compartment  syndrome or neurovascular compromise.  I recommend continued heat.  The  patient can be discharged and followed up in the office in 2-3 weeks.  No further follow-up needed.      Vickki Hearing, M.D.  Electronically Signed     SEH/MEDQ  D:  09/04/2007  T:  09/04/2007  Job:  [id number]

## 2011-01-19 ENCOUNTER — Other Ambulatory Visit: Payer: Self-pay | Admitting: Internal Medicine

## 2011-01-19 ENCOUNTER — Ambulatory Visit (INDEPENDENT_AMBULATORY_CARE_PROVIDER_SITE_OTHER): Payer: Medicare Other | Admitting: *Deleted

## 2011-01-19 DIAGNOSIS — I428 Other cardiomyopathies: Secondary | ICD-10-CM

## 2011-01-20 NOTE — Cardiovascular Report (Signed)
NAME:  [name], [name]                         ACCOUNT NO.:  [id number]   MEDICAL RECORD NO.:  [id number]                   PATIENT TYPE:  INP   LOCATION:  2003                                 FACILITY:  [facility]   PHYSICIAN:  Arturo Morton. Riley Kill, M.D. 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  He had evidence of an anterior wall myocardial infarction, and  plans were made to transfer the patient emergently to the [facility].  Chest x-ray there revealed  widened mediastinum, and a  CT scan was ordered.  Prior to this, the patient developed ventricular  fibrillation requiring countershock and was given amiodarone.  He  subsequently was transferred emergently to [facility] for further  emergent evaluation.  His onset of pain was at approximately 1 a.m.   PROCEDURES:  1. Left heart catheterization.  2. Left ventriculography.  3. Proximal root aortography.  4. Selective coronary arteriography.  5. Percutaneous angioplasty and stenting of the left anterior descending     artery.   DESCRIPTION OF PROCEDURE:  The patient was brought to the catheterization  lab and prepped and draped in the usual fashion.  Because a CT scan was  unable to be performed, the plan was to do an aortic root angiogram first to  exclude aortic dissection.  We discussed the need for acute catheterization  with the patient, and I also met his family.  He was brought emergently to  the catheterization lab and prepped and draped in the usual fashion.  Through an anterior puncture the right femoral artery was entered.  A  pigtail catheter was then taken to the central aorta and proximal root  aortography performed to exclude aortic  dissection.  With this, there was no  significant evidence of aortic dissection.  Ventriculography was then  performed, followed by coronary arteriography.  The patient had an ostial  occlusion of the left anterior descending artery.  Heparin had already been  given, and Integrilin was given according to protocol.  We used a 7 [place] guiding catheter and placed a high-torque floppy across the lesion.  The  lesion was ostial, and the vessel was dilated using a 2 mm CrossSail  balloon.  This was eventually upgraded to a 2.75 mm CrossSail.  We then  carefully placed a 2.75 x 16 [employer] Express II stent, being  careful to place it in the proximal aspect of the left anterior descending  artery.  This was then dilated to approximately 10 atmospheres for 56  seconds.  We then postdilated using a 3 x 10 mm CrossSail balloon up to as  high as 11 atmospheres to fully deploy  the stent.  With this, there was re-  establishment of TIMI III flow and stabilization.  There was continued  evidence of mid-LAD disease, which we elected not to dilate, largely because  the patient has multivessel disease and will later require revascularization  surgery.  In addition, there is disease of the diagonal.  However, he was  stabilized and further dilatation was not performed.  All catheters were  subsequently removed, the femoral sheath sewn into place, and the patient  taken to the coronary care unit in satisfactory condition.   HEMODYNAMIC DATA:  1. Central aorta 105/72.  2. Left ventricle 100/36.  3. No gradient on pullback across the aortic valve.    ANGIOGRAPHIC DATA:  1. Proximal root aortography revealed a relatively normal-sized aorta.  The     aortic leaflets appear to move well.  There is no inordinate     calcification of the aortic valve.  No significant aortic regurgitation     is noted.  There is no evidence of dissection.  2. Ventriculography was performed in the RAO projection.   There was perhaps     trace mitral regurgitation.  There was hyperdynamic motion of the     anterobasal and inferobasal segments.  The mid- and distal anterolateral     wall were akinetic, and the apex and distal inferior wall were     practically dyskinetic.  3. On plain fluoroscopy there was calcification of the coronary arteries.  4. The left main coronary artery was a large-caliber vessel.  At the     distalmost aspect there is about 30% narrowing, but the vessel at this     point appears to be in excess of 3 mm as well.  5. The left anterior descending artery is totally occluded right at its     ostium.  6. There is a tiny ramus intermedius vessel that is with minimal luminal     irregularity proximally but without critical disease.  7. The circumflex proper bifurcates proximally, dividing two marginal     branches and then going around to the posterolateral segment.  The first     large marginal branch has about an 80% proximal stenosis, and this is a     large-caliber vessel supplying a significant portion of the lateral wall.     The second marginal branch also has a long area of segmental plaquing     with the most severe point being about 90%.  Both of these marginal     branches appear to be suitable for grafting.  The AV circumflex has some     mild luminal irregularity.  8. The right coronary artery provides a fairly small posterior descending     branch as previously noted.  The AV nodal artery does appear to arise     from this vessel.  At the crux just prior to the origin of the PDA there     appears to be about 70% narrowing in the right coronary.   Following reperfusion therapy there was evidence of a high-grade stenosis  really just beyond the ostium.  This vessel was dilated and subsequently  stented from total occlusion to a widely patent vessel.  There was some  narrowing at the ostium just prior to the stent site.  Overlapping the takeoff of the stent is a fairly  large diagonal branch which supplies also  portions of the lateral wall.  This branch appears to be suitable for  grafting.  It  is subtotally occluded at its origin.  There is an approximate  80% stenosis in the LAD overlapping the origin of the second diagonal  branch.  This area was not dilated as previously noted, as it did not appear  to be acute nor did it appear to significantly limit outflow, but the distal  LAD coursed to the apex and there was some segmental disease in the LAD,  perhaps of about 50% distally.   CONCLUSION:  1. Acute anterior wall myocardial infarction, treated with primary     percutaneous angioplasty and stenting, with successful re-establishment     of TIMI III flow from TIMI 0 flow.  2. Moderate left ventricular dysfunction with a large anterior wall motion     abnormality in the RAO view.  3. Multivessel coronary artery disease with disease also involving the     diagonal, two large marginals, and posterior descending branch as well as     the distal left anterior descending coronary artery, as described above.    DISPOSITION:  The patient appears to have had a large event.  We will try to  get him through this at the present time, and then surgical consultation  will be obtained for potentially further treatment.  His amiodarone will be  discontinued over the next 24-48 hours.  He will be treated with aspirin and  Plavix.                                                  Arturo Morton. Riley Kill, M.D. [facility]    TDS/MEDQ  D:  03/29/2002  T:  04/07/2002  Job:  [id number]   cc:   Winn Jock. Earl Gala, M.D.   [facility]

## 2011-01-20 NOTE — Discharge Summary (Signed)
[name], [name]               ACCOUNT NO.:  [id number]   MEDICAL RECORD NO.:  [id number]          PATIENT TYPE:  INP   LOCATION:  6533                         FACILITY:  [facility]   PHYSICIAN:  Doylene Canning. Ladona Ridgel, M.D.  DATE OF BIRTH:  [birth year]   DATE OF ADMISSION:  01/31/2006  DATE OF DISCHARGE:                                 DISCHARGE SUMMARY   ALLERGIES:  No known drug allergies.   PRINCIPAL DIAGNOSES:  1.  Discharging day #1 status post implantation of Guidant Contak Renewal 3      biventricular cardioverter-defibrillator.  2.  Ischemic cardiomyopathy.      1.  Ejection fraction 25-30% by echocardiogram Jan 15, 2006.      2.  Significant mitral regurgitation.      3.  Anterior and apical akinesis.      4.  Inferior wall and septum with severe hypokinesis.      5.  Aneurysmal dilatation of the anterior wall.  3.  Class IIa congestive heart failure symptoms.  4.  Right bundle-branch block.   SECONDARY DIAGNOSES:  1.  Hypertension.  2.  Dyslipidemia.  3.  Treated hypothyroidism.   PROCEDURE:  Jan 31, 2006:  Implant of Guidant biventricular cardioverter-  defibrillator with Dr. Lewayne Bunting.   BRIEF HISTORY:  [name] is an 77 year old male.  He is quite active.  He  has coronary artery disease.  He is status post CABG in 2004.  He had a  heart attack at that time.  His ejection fraction at CABG was 30-35%.   Currently he has some dyspnea with exertion but his congestive heart failure  symptoms are class IIa.  He has minimal amounts of dyspnea with exertion but  he gets tired pretty easily during the daytime.  Despite this, he works  every day but does take a long normal-appearing.  The patient had a repeat  echocardiogram Jan 15, 2006.  The study shows ejection fraction 25-30%.  The  patient also had right bundle-branch block with a QRS of 150 milliseconds.  He also has marked first degree A-V block.  He is a candidate for the MADIT-  CRT study.  Risks and benefits  have been explained to the patient and he  wishes to proceed.   HOSPITAL COURSE:  Elective presentation [facility] Jan 31, 2006.  Implantation of cardioverter-defibrillator the same day by Dr. Lewayne Bunting.  There were no post procedural complications.  Chest x-ray shows that all the  leads are in appropriate position.  Interrogation of the device shows that  all values are within normal limits.  On telemetry there is biventricular  pacing.  The patient's incision shows no evidence of hematoma, drainage, or  erythema.  Mental status is clear.  The patient discharging May 31.  He goes  home on a low sodium, low cholesterol diet.  He is asked not to lift  anything heavier than 10 pounds for the next 4 weeks.  He is not to drive  for the next week.  He is to keep his incision dry for the next 7 days  and  sponge bathe until Wednesday, June 6.   MEDICATIONS AT DISCHARGE:  1.  Enteric-coated aspirin 81 mg daily.  2.  Multivitamin daily.  3.  Levothyroxine 112 mcg daily.  4.  Zocor 20 mg daily at bedtime.  5.  Toprol-XL 25 mg daily.   FOLLOWUP:  1.  He has followup at [facility] [address] ICD clinic      on Wednesday, February 14, 2006, at 10:20 a.m.  2.  To see Dr. Ladona Ridgel Tuesday, May 22, 2006, at 2 p.m.   PERTINENT LABORATORY STUDIES THIS ADMISSION:  PTT is 33.9, PT is 12.8, INR  1.1.  Complete blood count:  White cells are 7.5, hemoglobin 13.8,  hematocrit 40.7, platelets 189.  Serum electrolytes:  Sodium 143, potassium  4.2, chloride 107, carbonate 29, glucose 80, BUN is 23, creatinine 1.3.      Maple Mirza, P.A.    ______________________________  Doylene Canning. Ladona Ridgel, M.D.    GM/MEDQ  D:  02/01/2006  T:  02/01/2006  Job:  [id number]   cc:   Arturo Morton. Riley Kill, M.D. [facility]  [address]  [address]  [place]  [place] [address]   Theressa Millard, M.D.  Fax: [phone]

## 2011-01-20 NOTE — Cardiovascular Report (Signed)
NAME:  [name], [name]                         ACCOUNT NO.:  [id number]   MEDICAL RECORD NO.:  [id number]                   PATIENT TYPE:  INP   LOCATION:  2003                                 FACILITY:  [facility]   PHYSICIAN:  Alleen Borne, M.D.               DATE OF BIRTH:  09-27-1922   DATE OF PROCEDURE:  03/31/2002  DATE OF DISCHARGE:                              CARDIAC CATHETERIZATION   PREOPERATIVE DIAGNOSIS:  Severe three-vessel coronary artery disease, status  post anteroseptal myocardial infarction.   POSTOPERATIVE DIAGNOSIS:  Severe three-vessel coronary artery disease,  status post anteroseptal myocardial infarction.   OPERATIVE PROCEDURE:  1. Median sternotomy.  2. Extracorporeal circulation.  3. Coronary artery bypass graft surgery x 6 using a left internal mammary     graft to the left anterior descending coronary artery, with saphenous     vein grafts to the first and second diagonal branch to the LAD, a     sequential saphenous vein graft to the first and second obtuse marginal     branches of the left circumflex coronary artery, and a saphenous vein     graft to the posterior descending of the right coronary artery.   ATTENDING SURGEON:  Alleen Borne, M.D.   ASSISTANT:  Levin Erp. Steward, P.A.-C.   ANESTHESIA:  General endotracheal.   CLINICAL HISTORY:  This patient is a 77 year old, previously health  gentleman, who was admitted with a large anteroseptal MI secondary to LAD  occlusion.  The LAD was opened with percutaneous intervention.  He also had  a 70-80% mid LAD stenosis, 75% second marginal, 80% third obtuse marginal  stenosis, and 60% right coronary artery stenosis before the posterior  descending branch.  There were two diagonal branches that were stenosed.  Ejection fraction was 47% by catheterization.  An echocardiogram showed an  EF of 50-55% with anteroseptal and periapical akinesis.  He remained free of  chest pain and shortness of breath.  He  did have ventricular fibrillation  arrest at [facility] prior to transfer here and had some  nonsustained ventricular tachycardia here.  After reviewing the angiogram  and examination of the patient, it was felt that coronary artery bypass  graft surgery was the best treatment.  I discussed the operative procedure  with the patient and his family, and they understood and agreed to proceed.  We discussed alternatives, benefits, and risks including bleeding, possible  blood transfusion, infection, stroke, myocardial infarction, graft failure,  and death.   OPERATIVE PROCEDURE:  The patient was taken to the operating room and placed  on the table in a supine position.  After induction of general endotracheal  anesthesia, a Foley catheter was placed in the bladder using sterile  technique.  Then the chest, abdomen, and both lower extremities were prepped  and draped in the usual sterile manner.  The chest was entered through a  median sternotomy  incision and the pericardium opened in the midline.  Examination of the heart showed akinesis of the anterior wall.  There  appeared to be viable muscle there.  The ascending aorta was fairly long and  was lying towards the right side.  There were no palpable plaques in it.   Then the left internal mammary artery was harvested from the chest wall as a  pedicle graft.  This was a medium caliber vessel with excellent blood flow  through it.  At the same time, a segment of the greater saphenous vein was  harvested from the left leg.  The vein was of large caliber and good  quality.  There were a few varicosities in the vein which required segmental  resections of this area and reanastomosed.  The patient had limited vein in  his legs and had very varicosed veins in the right leg.   The the patient was heparinized and after adequate clotting time was  achieved, the distal ascending aorta was cannulated using a 20 French aortic  cannula for  arterial inflow.  Venous outflow was achieved using a two-stage  venous cannula through the right atrial appendage.  An antegrade  cardioplegia and vent cannula was inserted into the aortic root.   The patient was placed on cardiopulmonary bypass and the distal coronaries  identified.  The LAD was a large graftable vessel.  The first and second  diagonal branches were medium sized graftable vessels.  The two obtuse  marginal branches were intramyocardial but were large graftable vessels.  The posterior descending artery was small but felt to be graftable.   Then the aorta was crossclamped, and 500 cc of cold blood and antegrade  cardioplegia was administered in the aortic root with quick arrest of the  heart.  Systemic hypothermia to 20 degrees centigrade and topical  hypothermia with iced saline was used.  A temperature probe was placed in  the septum and an insulating pad in the pericardium.   The first distal anastomosis was performed to the first obtuse marginal  branch.  The internal diameter of this vessel was about 2 mm.  The conduit  used was a segment of the greater saphenous vein and anastomosis performed  in a sequential side-to-side manner using continuous 7-0 Prolene suture.  Flow was measured through the graft and was excellent.   The second distal anastomosis was performed to the second marginal branch.  The internal diameter of this vessel was about 2 mm.  The conduit used was  the same segment of greater saphenous vein and the anastomosis performed in  a sequential end-to-side manner using continuous 7-0 Prolene suture.  Flow  was measured through the graft and was excellent.  Another dose of  cardioplegia was given on the vein graft and in the aortic root.   The third distal anastomosis was performed at the posterior descending  coronary artery.  The internal diameter was about 1.5 mm.  The conduit used was a second segment of greater saphenous vein, the anastomosis  performed in  an end-to-side manner using continuous 7-0 Prolene suture.  Flow was  measured through the graft and was excellent.  Then another dose of  cardioplegia was given on this vein graft.   The fourth distal anastomosis was performed at the first diagonal branch.  The internal diameter was 1.6 mm.  The conduit used was a third segment of  greater saphenous vein, the anastomosis performed in an end-to-side manner  using continuous 7-0 Prolene  suture.  Flow was measured through the graft  and was excellent.   The fifth distal anastomosis was performed at the second diagonal branch.  The internal diameter was 1.6 mm.  The conduit used was a fourth segment of  greater saphenous vein, the anastomosis performed in an end-to-side manner  using continuous 7-0 Prolene suture.  Flow was measured through the graft  and was excellent.  Then another dose of cardioplegia was given on the vein  grafts and in the aortic root.   The sixth distal anastomosis was then performed to the mid portion of the  left anterior descending coronary artery.  The internal diameter is now 2.5  mm.  The conduit used was a left internal mammary graft, and this was  brought through an opening in the left pericardium anterior to the phrenic  nerve.  It was then anastomosed to the LAD in a end-to-side manner using  continuous 8-0 Prolene suture.  The pedicle was tacked to the  epicardium  with 6-0 Prolene sutures.  The patient was rewarmed to 37 degrees centigrade  and the clamp removed from the mammary pedicle.  There was rapid warming of  the ventricular septum and returned to spontaneous ventricular fibrillation.  The crossclamp removed with time of 18 minutes, and the patient  spontaneously converted to sinus rhythm.   A partial occlusion clamp was placed in the aortic root, and three of the  proximal anastomoses were performed in the aortic root in end-to-side manner  using continuous 6-0 Prolene suture.  The  proximal anastomosis of the second  diagonal vein graft was performed to the side of the first diagonal vein  graft in an end-to-side manner using continuous 7-0 Prolene suture.  The  clamp was removed and then grasped at the edge at the proximal _____ .  The  proximal and distal anastomoses appeared hemostatic and _____ satisfactory.  Cardiac monitors were placed on the proximal anastomoses.  Two temporary  biventricular and right atrial pacing wires were placed and brought through  the skin.   When the patient had been warmed to 37 degrees centigrade, he was weaned  from cardiopulmonary bypass on low-dose dopamine.  Total bypass time was 153  minutes.  Cardiac function appeared good with a cardiac output of 6 liters  per minutes.  Protamine was given, and the venous and aortic cannulas were  removed without difficulty.  Hemostasis was achieved.  Three chest tubes were placed with tube in the posterior pericardium, one in the left pleural  space, and in the anterior mediastinum.  The pericardium was reapproximated  over the heart.  The sternum closed with #6 ______wires.  Fascia was closed  with continuous #1 Vicryl suture.  The subcutaneous tissue was closed using  2-0 silk Vicryl and the skin with 3-0 Vicryl subcuticular closure.  The  lower extremity vein harvest site was closed in layers in a similar manner.  The sponge, needle, and instrument counts were correct as per the scrub  nurse.  Dry sterile dressings were applied over the incisions around the  chest tubes, which were hooked for suction.  The patient remained  hemodynamically stable and transported to the SICU in guarded but stable  condition.                                               Judie Grieve  Jennefer Bravo, M.D.    IRJ/JOAC  D:  03/31/2002  T:  04/04/2002  Job:  [id number]   cc:   Arturo Morton. Riley Kill, M.D. [facility]

## 2011-01-20 NOTE — Op Note (Signed)
[name]               ACCOUNT NO.:  [id number]   MEDICAL RECORD NO.:  [id number]          Brooks TYPE:  INP   LOCATION:  2807                         FACILITY:  [facility]   PHYSICIAN:  Doylene Canning. Ladona Ridgel, M.D.  DATE OF BIRTH:  1923/04/11   DATE OF PROCEDURE:  01/31/2006  DATE OF DISCHARGE:                                 OPERATIVE REPORT   PROCEDURE PERFORMED:  Implantation of dual-chamber biventricular ICD.   INDICATION:  Ischemic cardiomyopathy with class II congestive heart failure  and bundle branch block, QRS duration of 150 milliseconds in Adam setting of  EF of 30%.   I. INTRODUCTION:  [name] is an 77 year old man who has a history of  coronary disease status post bypass surgery in 2004.  He has an EF 30%.  He  has class II congestive heart failure symptoms.  He works as Primary school teacher.  [name] has never had syncope.  He has bundle branch block  and a QRS duration of 150 milliseconds.  He is referred for ICD implantation  and subsequently enrolled in __________ CRT study; and has been randomized  to receive a biventricular ICD.   II. PROCEDURE:  After informed consent was obtained [name] was taken to  Adam diagnostic EP lab in Adam fasting state.  After Adam usual preparation and  draping, intravenous fentanyl and metaxalone were given for sedation.  Then  30 mL of lidocaine was infiltrated into Adam left infraclavicular region.  A  7-cm incision was carried out over this region; and electrocautery utilized  to dissect down to Adam fascial plane.  Adam left subclavian vein was  punctured x3 and Adam Guidant model:  [id number] active fixation, 9-French,  defibrillator lead was advanced to Adam RV septum.   R waves were low and mapping was carried out extensively throughout Adam RV  septum.  Adam R waves varied between 5 and 6-1/2 mV in this region.  At Adam  final site Adam R waves were 5-1/2 mV and with Adam lead actively fixed; Adam  pacing impedance was 632  ohms, and Adam pacing threshold 0.7 volts at 0.5  milliseconds; 10 volt pacing did not stimulate Adam diaphragm.   With Adam RV lead in satisfactory position, attention then turned to  placement of Adam atrial lead.  This was placed in Adam anterolateral wall at  Adam right atrium where P waves measured 1-1/2 mV; Adam pacing impedance was  567 ohms, and Adam pacing threshold was 1.6 volts at 0.5 milliseconds; 10  volt pacing did not stimulate Adam diaphragm.   With both right atrial and right ventricular/defibrillation leads in  satisfactory position, attention was then turned to placement of left  ventricular lead.  Adam EP catheter was advanced along with Adam coronary  sinus guiding catheter into Adam right atrium.  Coronary sinus cannulation  was carried out without difficulty.  Venography demonstrated a large lateral  vein which had a shepherd's crook, and ran down Adam posterolateral wall of  Adam left ventricle.  Multiple attempts were carried out to get an  angioplasty guidewire into this vein.  Ultimately Adam Rapid-O subselective  catheter was utilized and Adam lateral vein was cannulated.  Initially Adam  Guidant EZ tract 3 lead was advanced into Adam vein; however, with removal of  Adam angioplasty guidewire Adam LV lead prolapsed out of Adam vein.  For this  reason, Adam St. Jude Quick Site XL model 1058 86 centimeter LV pacing lead  was advanced into Adam lateral vein.  Adam lead serial number was [id number].  With Adam lead placed in Adam lateral vein approximately two-thirds of Adam  distance from base to apex, Adam LV waves measured 11 mV, Adam pacing  impedance was 716 ohms, and Adam pacing threshold 2.2 volts at 0.5  milliseconds; 10 volt pacing did not stimulate Adam diaphragm.   With these satisfactory parameters Adam leads were secured to Adam  subpectoralis fascia with silk suture and Adam sewing sleeve was also secured  with a silk suture.  Electrocautery was then utilized to make subcutaneous   pocket.  Kanamycin irrigation was utilized to irrigate Adam pocket; and  electrocautery utilized to assure hemostasis.  Adam Guidant contact renewal  III model H-170 biventricular ICD serial number [id number] was connected to Adam  right atrial, right ventricular, and left ventricular leads; and placed in  Adam subcutaneous pocket.  Adam generator was secured with a silk suture.  At  this point defibrillation threshold testing was carried out.   After [name] was more deeply sedated with fentanyl and Versed, VF was  induced with a T wave shock.  A 14 joules shock was subsequently delivered  which terminated VF and restored sinus rhythm.  Five minutes was allowed to  elapse and a second DFT test was carried out.  Again, VF was induced with a  T wave shock; and, again, a 14 joules shock was delivered which terminated  ventricular fibrillation and restored sinus rhythm.  At this point, no  additional defibrillation threshold testing was carried out; and Adam  incision was closed with a layer of 2-0 Vicryl, followed by 3-0 Vicryl,  followed by a layer of 4-0 Vicryl.  Benzoin was painted on Adam skin.  Steri-  Strips were applied and a pressure dressing was placed; and [name] was  returned to his room in satisfactory condition.   III. COMPLICATIONS:  There were no immediate procedure complications.   IV. RESULTS:  This demonstrates successful implantation of a Guidant  biventricular ICD in a Brooks with an ischemic cardiomyopathy and  congestive heart failure.           ______________________________  Doylene Canning. Ladona Ridgel, M.D.     GWT/MEDQ  D:  01/31/2006  T:  01/31/2006  Job:  [id number]   cc:   Arturo Morton. Riley Kill, M.D. [facility]  [address]  [address]  [place]  [place] [address]   Theressa Millard, M.D.  Fax: [phone]

## 2011-01-20 NOTE — Assessment & Plan Note (Signed)
[facility]                           ELECTROPHYSIOLOGY OFFICE NOTE   QUADARIUS, HENTON                      MRN:          [id number]  DATE:05/22/2006                            DOB:          Jan 30, 1923    HISTORY OF PRESENT ILLNESS:  Mr. Lambert Mody returns today for followup. He is a  very pleasant 77 year old male with an ischemic cardiomyopathy complicated  by a VF arrest back in July of 2003. He has a history of severe LV  dysfunction and congestive heart failure and has randomized to the MADIT-CRT  study, receiving Bi-V ICD therapy. He returns today for followup and  overall, is feeling well. His activity level is quite good, denying chest  pain or shortness of breath. He denies palpitations.   PHYSICAL EXAMINATION:  GENERAL:  A pleasant, well appearing man in no acute  distress.  VITAL SIGNS:  Blood pressure today was 118/64, pulse 53 and regular.  Respiratory rate 18. Weight 183 pounds.  NECK:  No jugular venous distention.  LUNGS:  Clear to auscultation bilaterally.  CARDIOVASCULAR:  Regular rate and rhythm. Normal S1 and S2.  EXTREMITIES:  No clubbing, cyanosis, or edema.   PACEMAKER INTERROGATION:  Interrogation of his defibrillator demonstrates a  Guidant Contact H170 with P waves of 1.8 millivolts, R waves of 6.1  millivolts, and a pacing impedence of 641 in the atrium, 530 in the right  ventricle, and 772 in the left ventricle. The threshold was __________ 0.5  in the atrium, 0.6 at 0.5 in the ventricle, and 0.4 at 0.5 in the left  ventricle. The patient's hysterogram's did demonstrate 181 high atrial rate  episodes, consistent with atrial fibrillation, some of which have lasted up  to 30 minutes. Today, his outputs were turned down.   IMPRESSION:  1. Ischemic cardiomyopathy.  2. Congestive heart failure.  3. Status post ICD insertion.  4. Paroxysmal atrial fibrillation (asymptomatic.)   DISCUSSION:  There are multiple issues  today. The most important one is his  paroxysmal atrial fibrillation. He is not on any Coumadin and I think his  risk for thromboembolic complications with his atrial fibrillation, which  has been present for over 30 minutes, along with his cardiomyopathy, will  require that we start him on Coumadin. I have given him a prescription today  for this but will  ultimately defer to Dr. Bonnee Quin for this, who he sees in several days.  Will plan to see him back as per MADIT-CRT protocol.                                   Doylene Canning. Ladona Ridgel, MD   GWT/MedQ  DD:  05/22/2006  DT:  05/23/2006  Job #:  [id number]   cc:   Theressa Millard, M.D.

## 2011-01-20 NOTE — Consult Note (Signed)
[place]  Patient:    KARIS, EMIG Visit Number: [id number] MRN: [id number]          Service Type: MED Location: [phone] Attending Physician:  Rollene Rotunda Dictated by:   Doylene Canning. Ladona Ridgel, M.D. [facility] Proc. Date: 03/24/02 Admit Date:  03/23/2002   CC:         Theressa Millard, M.D.  Kathrine Cords, [facility]   Consultation Report  REQUESTING PHYSICIAN:  Arturo Morton. Riley Kill, M.D. [facility]  REASON FOR CONSULTATION:  Evaluation of bifascicular block and nonsustained VT in the setting of a previous anterior myocardial infarction.  HISTORY OF PRESENT ILLNESS:  The patient is a very pleasant 77 year old man who was in his usual state of health until March 23, 2002 when he developed substernal chest pain.  After 1 hour, he presented to the emergency room where he was found to have an anterolateral myocardial infarction.  He subsequently had a VF arrest and was shocked x2 restoring sinus rhythm.  He was initially placed on lidocaine and amiodarone and transferred for additional evaluation. He underwent urgent heart catheterization which demonstrated a totally occluded LAD at the ostium and it was subsequently angioplastied and stented. He also had a 70-80% mid-LAD lesion and a 75% OM2 and 80% OM3.  There was a 60% RCA stenosis just prior to the PDA.  His ejection fraction was calculated to be 47%.  He had done reasonably well since his intervention.  His CK-MB peaked at over 1000.  He has not had anymore chest pain.  On the monitor, he has had episodes of sinus bradycardia as well as PACs which have not been conducted as well as nonsustained VT and accelerated idioventricular rhythm. With all this, he has been completely asymptomatic.  The patient denies any history of syncope or near syncope.  He presently denies chest pain or shortness of breath.  PAST MEDICAL HISTORY:  Hypertension and hypothyroidism.  PAST SURGICAL HISTORY:  Hernia  repair.  MEDICATIONS:  His medications on admission include Synthroid 0.125 mg daily, aspirin, Plavix, Zocor, and Protonix.  SOCIAL HISTORY:  The patient lives outside of [place] in [place], [place] and is married.  He is a retired Engineer, site.  He denies tobacco or ethanol abuse.  FAMILY HISTORY:  Noncontributory.  PHYSICAL EXAMINATION:  GENERAL:  He was a pleasant well-appearing man in no distress.  VITAL SIGNS:  Blood pressure was 100/48, pulse was 66 and regular, respirations were 18-20, temperature was 97.6.  HEENT:  Normocephalic and atraumatic.  Oropharynx was moist.  NECK:  The carotids were 2+ and symmetric, there was no jugular venous distention, the thyroid was not appreciably enlarged and the trachea was midline.  LUNGS:  The lungs were bilaterally to auscultation.  There were no rales, wheezes, or rhonchi.  CARDIOVASCULAR:  Irregular rate and rhythm with normal S1 and S2.  I did not appreciate an S3 or an S4.  ABDOMEN:  Soft, nontender.  EXTREMITIES:  No peripheral edema.  The pulses were 2+ and symmetric.  EKG:  EKG demonstrates normal sinus rhythm with extensive anterolateral myocardial infarction with residual ST elevation consistent with aneurysmal formation.  IMPRESSION: 1. Acute anterolateral myocardial infarction treated with primary angioplasty. 2. Nonsustained ventricular tachycardia. 3. Ventricular fibrillation arrest in the setting of acute myocardial    infarction. 4. Sinus bradycardia with bifascicular block.  DISCUSSION:  The patient sustained a large myocardial infarction. Interestingly, his ejection fraction is somewhat higher than we would expect. I think  proceeding with reinitiation of low-dose ACE inhibitor and low-dose beta blocker would be most efficacious at the present time.  We will plan to observe him on telemetry for approximately 4-5 days.  If he continues to have nonsustained VT then we would consider invasive  electrophysiologic testing if his LV function is less than 40% by 2-D echo.  For now, I would not recommend any empiric antiarrhythmic drug therapy, i.e., amiodarone, for his ventricular ectopy unless it has become sustained.Dictated by:   Doylene Canning. Ladona Ridgel, M.D. [facility]  Attending Physician:  Rollene Rotunda DD:  03/24/02 TD:  03/28/02 Job: [id number]

## 2011-01-20 NOTE — Consult Note (Signed)
[place]. [facility]  Patient:    [name], [name] Visit Number: [id number] MRN: [id number]          Service Type: MED Location: [id number] Attending Physician:  Rollene Rotunda Dictated by:   Alleen Borne, M.D. Proc. Date: 03/28/02 Admit Date:  03/23/2002                            Consultation Report  CARDIOVASCULAR SURGERY CONSULTATION  REASON FOR CONSULTATION:  Severe three vessel coronary disease status post anteroseptal myocardial infarction.  HISTORY OF PRESENT ILLNESS:  This patient is a 77 year old previously healthy white male with a history of hypertension, hypothyroidism who awoke at midnight March 23, 2002 with substernal chest pain.  This was associated with some nausea.  His wife drove him to [facility] where he was noted to have anterolateral ST elevation.  Arrangements were made to transfer the patient to [facility] for percutaneous coronary intervention.  There was some question of wide mediastinum on chest x-ray and the patient therefore was not given heparin due to the concern of aortic dissection.  A CT scan of the chest was attempted to rule out aortic dissection, patient suffered ventricular fibrillation requiring cardioversion, lidocaine and amiodarone. He was stabilized and transferred to [facility] for further treatment.  He underwent immediate percutaneous coronary intervention early in the morning on March 23, 2002.  This showed total occlusion of the left anterior descending just after its ostium.  This was opened with percutaneous intervention with less than 10% residual stenosis proximally with TIMI 3 flow. There was also 70 to 80% mid left anterior descending lesion.  There were two diagonal branches that had stenoses.  The left circumflex had two large marginal branches.  The second marginal had 75% stenosis and the third marginal had 80% stenosis.  The right coronary artery had 60% stenosis  prior to the posterior descending artery.  Left ventricular ejection fraction was about 47% with anterior apical akinesis.  There was no gradient across the aortic valve.  The CPK isoenzymes peaked at 9813 with MB fraction 1135. Troponin peaked at 221.6.  The patient also had some elevation of his liver function tests with an AST of 223 and ALT of 80, total bilirubin 4.3.  Since catheterization the patient has remained stable and free of chest pain or shortness of breath.  REVIEW OF SYSTEMS:  CONSTITUTIONAL:  The patient denies fever or chills.  He has had no recent weight changes.  He has had some generalized weakness and tiredness after exertion recently.  Eyes are negative.  ENT:  Negative. ENDOCRINE:  He denies diabetes.  He does have hypothyroidism.  CARDIOVASCULAR: As above.  He has had no shortness of breath.  He denies orthopnea and paroxysmal nocturnal dyspnea.  He has had no peripheral edema.  He has had some palpitations since his myocardial infarction.  RESPIRATORY:  He denies cough and sputum production.  GASTROINTESTINAL:  He did have nausea with his heart attack.  He denies dysphagia.  He has had no melena or bright red blood per rectum.  Denies diarrhea or constipation.  GU:  Denies dysuria and hematuria.  NEUROLOGICAL:  He has had no focal weakness or numbness.  Denies dizziness or syncope.  PSYCHIATRIC:  Negative.  MUSCULOSKELETAL:  He denies arthralgias or myalgias.  VASCULAR:  He denies claudication.  He does have varicose veins in his legs.  PAST MEDICAL  HISTORY:  Significant for hypertension and hypothyroidism.  He is status post right hernia repair in February of 2000.  MEDICATIONS: 1. Aspirin one q.d. 2. Tenormin. 3. Synthroid. 4. Two other medications that he does not know the names of.  ALLERGIES:  None.  SOCIAL HISTORY:  [place]. 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  FAMILY HISTORY:   Negative for coronary disease.  He has never smoked cigarettes.  He does not drink alcohol.  PHYSICAL EXAMINATION:  VITAL SIGNS:  Blood pressure 134/84 and his heart rate is 65 and regular with occasional extra beats.  GENERAL:  He is a well-developed white male in no distress.  HEENT:  Normocephalic, atraumatic.  Pupils are equal, round and reactive to light and accommodation.  Extraocular muscles are intact.  His throat is clear.  NECK:  Normal carotid pulses.  There are no bruits.  There is no lymphadenopathy or thyromegaly.  CARDIAC:  Regular rate and rhythm.  Positive S4.  There is no murmur or rub.  LUNGS:  Examination is clear.  ABDOMEN:  Shows active bowel sounds. His abdomen is soft, flat, nontender. There are no palpable masses or organomegaly.  EXTREMITIES:  No peripheral edema.  There are varicose veins, worse on the right leg than the left.  There are chronic skin changes in his lower legs from local trauma.  NEUROLOGICAL:  Patient is alert and oriented x 3. Motor and sensory examinations are grossly normal.  LABORATORY DATA:  Chest x-ray from March 25, 2002 shows decrease in pulmonary edema.  There are right upper lobe and left lower lobe atelectasis or residual edema.  On March 27, 2002 albumin is 2.8. His AST is decreased to 164 and his ALT is decreased to 70.  Bilirubin is normal at 1.1.  On March 26, 2002 his white blood cell count is decreased to 13.1. Hemoglobin 13.2 and hematocrit is 39.3 with platelet count 166,000.  His electrolytes are normal with BUN of 22 and creatinine of 1.3.  His TSH level is in normal range at 2.917.  Urinalysis was negative.  IMPRESSION:  This patient has severe three vessel coronary artery disease, status post large anterior septal myocardial infarction on March 23, 2002 secondary to left anterior descending occlusion.  He has remained asymptomatic since then, although he has had some nonsustained ventricular tachycardia.   I think the best treatment for him is coronary artery bypass grafting surgery to  prevent further ischemia and infarction.  He is now about five days post infarction.  Ideally I would like to let him wait until Monday, which will be about 7 or 8 days post infarction to perform surgery.  I think the benefit of waiting longer than that is outweighed by the risk of developing  further ischemia and infarction, particularly after recent myocardial infarction.  I have discussed the operative procedure with him including alternatives, benefits and risks including bleeding, blood transfusion, infection, stroke, myocardial infarction and death.  He understands and would like to proceed with surgery.  I would recommend continuing the Plavix until the time of surgery and proceeding with surgery on Monday. Dictated by:   Alleen Borne, M.D. Attending Physician:  Rollene Rotunda DD:  03/28/02 TD:  03/30/02 Job: [id number]

## 2011-01-20 NOTE — Procedures (Signed)
[facility]  Patient:    [name], [name] Visit Number: [id number] MRN: [id number]          Service Type: EMS Location: ED Attending Physician:  Herbert Seta Dictated by:   Kari Baars, M.D. Admit Date:  03/23/2002 Discharge Date: 03/23/2002                            EKG Interpretations  The rhythm is sinus rhythm with a rate of about 80.  There is first-degree AV block.  There was marked ST elevation across the precordium which is indicative of an acute anterior myocardial infarction.  Abnormal electrocardiogram. Dictated by:   Kari Baars, M.D. Attending Physician:  Herbert Seta DD:  03/24/02 TD:  03/26/02 Job: [id number]

## 2011-01-20 NOTE — Op Note (Signed)
[place]. [facility]  Patient:    [name], [name]                      MRN: [id number] Proc. Date: 10/17/00 Adm. Date:  [id number] Attending:  Brandy Hale CC:         Winn Jock. Earl Gala, M.D.  [phone]   Operative Report  PREOPERATIVE DIAGNOSIS:  Right inguinal hernia.  POSTOPERATIVE DIAGNOSIS:  Right inguinal hernia.  OPERATION PERFORMED:  Repair right inguinal hernia with mesh Armanda Heritage repair).  SURGEON:  Angelia Mould. Derrell Lolling, M.D.  ANESTHESIA:  INDICATIONS FOR PROCEDURE:  The patient is a 77 year old gentleman who has had a painful bulge in his right groin for at least one month.  He is very active with hunting and his work and the discomfort has begun to interfere with his activities.  On exam, he has moderate-sized right inguinal hernia which was reducible.  He was brought to the operating room electively for repair of his right inguinal hernia.  DESCRIPTION OF PROCEDURE:  The patient was placed supine on the operating table. He was monitored and sedated by the anesthesia department. 1% Xylocaine with epinephrine was used as a local infiltration anesthetic.  The transverse incision was made in the right groin overlying the inguinal canal.  Dissection was carried down through the subcutaneous tissue.  The external oblique aponeurosis was exposed.  The external oblique was incised in the direction of its fibers, opening up the external inguinal ring.  Cord structures were mobilized and encircled with Penrose drain.  I found that he had a moderately large indirect hernia sac anteromedial to the cord structures.  The floor of the inguinal canal was week but was only bulging minimally.  We dissected the indirect hernia sac away from the cord structures all the way back to the level of the internal ring.  We opened the sac and found that it was a simple sac.  The sac was then twisted and suture ligated at the level of the internal ring with  a suture ligature of 2-0 silk.  The redundant sac was excised.  No other abnormalities were noted.  The floor of the inguinal canal was repaired and reinforced with an onlay graft of polypropylene mesh.  The mesh was cut into an appropriate shape and sutured in place with running sutures of 2-0 Prolene.  The mesh was sutured so as to generously overlap the fascia of the pubic tubercle, along the inguinal ligament inferiorly and then along the internal oblique and transversis abdominis superiorly. The mesh was incised laterally so as to wrap around the cord structures at the internal ring.  The tails of the mesh were overlapped laterally and the suture lines completed. This provided a very secure repair both medial and lateral to the internal ring but allowed an adequate opening for the cord structures.  The wound was irrigated with saline.  Hemostasis was excellent.  The external oblique was closed with a running suture of 2-0 Vicryl placing the cord structures and the ilioinguinal nerve deep to the external oblique.  Scarpas fascia was closed with 3-0 Vicryl and the skin closed with running subcuticular suture of 4-0 Vicryl and Steri-Strips.  Clean bandages were placed and the patient taken to the recovery room in stable condition.  Estimated blood loss was about 10 cc. Complications were none.  Sponge, needle and instrument counts were correct. DD:  10/17/00 TD:  10/17/00 Job: [id number]

## 2011-01-20 NOTE — Discharge Summary (Signed)
NAMEMCGUIRE, GASPARYAN                         ACCOUNT NO.:  [id number]   MEDICAL RECORD NO.:  [id number]                   PATIENT TYPE:  INP   LOCATION:  2003                                 FACILITY:  [facility]   PHYSICIAN:  Alleen Borne, M.D.               DATE OF BIRTH:  Jul 18, 1923   DATE OF ADMISSION:  03/23/2002  DATE OF DISCHARGE:  04/05/2002                                 DISCHARGE SUMMARY   PRIMARY ADMISSION DIAGNOSIS:  Unstable angina, status post anterolateral  myocardial infarction.   SECONDARY DIAGNOSES/PAST MEDICAL HISTORY:  1. Hypertension.  2. Hypothyroidism.  3. Status post hernia repair.   DISCHARGE DIAGNOSES:  1. Anterolateral myocardial infarction.  2. Bifascicular block with sustained ventricular tachycardia in the setting     of a previous myocardial infarction, resolved.  3. Three-vessel coronary artery disease, status post coronary artery bypass     graft surgery.  4. Postoperative atrial fibrillation with rapid ventricular response,     resolved.  5. Postoperative anemia requiring transfusions, resolved.   PROCEDURES:  1. Cardiac cath done on March 23, 2002.  2. Pre-CABG Doppler evaluation done on March 29, 2002.  3. Coronary artery bypass graft surgery x 6 with the following grafts placed     (Done on March 31, 2002 by Dr. Laneta Simmers):     a. Left internal mammary artery to the left anterior descending artery.     b. Saphenous vein graft to the diagonal one branch.     c. Saphenous vein graft to the diagonal two branch.     d. Saphenous vein graft to the posterior descending.     e. Sequential saphenous vein graft to obtuse marginal one and obtuse        marginal two branch.     f. Also with saphenous vein harvest on the left side.  4. Postoperative blood transfusion done on April 01, 2002, and again on April 02, 2002.   HOSPITAL COURSE:  This patient is a 77 year old male who was previously  healthy with a large anteroseptal myocardial infarction  secondary to a left  anterior descending artery occlusion. The LAD was opened with percutaneous  intervention. He was found to have a 70-80% mid left anterior descending  artery of 75%, OM2 with 80%, OM3 with 60% of right coronary artery before  the posterior descending artery blockage. He was seen in consultation by Dr.  Laneta Simmers. His ejection fraction was 50-55% with anteroseptal and periapical  akinesis. It was recommended that CABG was the best treatment for the  patient. The patient agreed and he underwent the procedure as stated above  on March 31, 2002. He tolerated the procedure well. Postoperatively, he was  doing quite well. He did require Dopamine and Neo-Synephrine for pressure  support. Nevertheless, this was eventually weaned. He was transfused with 1  unit of packed red blood cells for postop anemia  with hemoglobin of 8.3 and  hematocrit of 24.4. He was initially anticipated for discharge to unit 2000  although his transfer failed secondary to his low blood pressure. Neo-  Synephrine was restarted and his pressure at around 90/50 with __________ at  20 cc per hour. Nevertheless, he continued to show signs of progress. He was  again transfused on April 02, 2002, since his hemoglobin was still 8.4 and  hematocrit 24.6. This was secondary to acute blood-loss anemia. He did have  some brief atrial fibrillation with rapid ventricular response. He was  started on a beta blocker and started on p.o. Amiodarone.  Nevertheless, he  converted to sinus rhythm. He continued to remain stable from a pulmonary  standpoint. He was tolerating a regular diet and was ambulating without  difficulty. His wounds remained clean and dry without any signs of infection  or complications. It is anticipated that he will be ready for discharge on  April 05, 2002.   DISCHARGE CONDITION:  Stable.   DISCHARGE MEDICATIONS:  1. Tylox 1-2 tablets every 4-6 hours as needed for pain.  2. Aspirin 325 mg daily.   3. Synthroid 0.125 mcg daily.  4. Amiodarone 400 mg; he will take two 200-mg tabs twice a day.  5. Toprol XL 25 mg daily.   ACTIVITY:  He is instructed not to do any driving, strenuous activity, or  lifting any heavy objects. He is to walk daily.   DIET:  He is to continue a low-fat, low-sodium diet.   WOUND CARE:  He was told that he may shower and clean his incision with mild  soap and water.   FOLLOWUP:  He is to call his cardiologist for a 2-week appointment and he  will see Dr. Laneta Simmers in 3 weeks. An appointment will be arranged at that  time. He was also instructed to bring a chest x-ray with him that he will  obtain at his cardiologist's office.     Myrlene Broker, P.A.                      Alleen Borne, M.D.    CML/MEDQ  D:  04/04/2002  T:  04/10/2002  Job:  [id number]   cc:   CVTS OFFICE   [facility]   Winn Jock. Earl Gala, M.D.   Alleen Borne, M.D.

## 2011-01-20 NOTE — Assessment & Plan Note (Signed)
[facility]                            CARDIOLOGY OFFICE NOTE   SADLER, TESCHNER                      MRN:          [id number]  DATE:09/17/2006                            DOB:          08/11/1923    [name] is in for followup.  He started Coreg CR.  He is tolerating  this well.  He has not been having any palpitations or syncope.   His medications include:  1. Coreg CR 10 mg daily.  2. Aspirin 81 mg daily.  3. Levothroid 112 mcg daily.  4. Simvastatin 20 mg daily.  5. Coumadin as directed.   PHYSICAL EXAMINATION:  Blood pressure is 119/61, pulse 51.  The lung fields are clear to auscultation and percussion.   IMPRESSION:  1. History of involvement in MADIT cardiac resynchronization therapy      with biventricular implantable cardioverter defibrillator.  2. History of paroxysmal atrial fibrillation.  3. Left ventricular dysfunction.  4. Ischemic cardiomyopathy with prior coronary artery bypass grafting      surgery.   PLAN:  1. Continue Coreg CR at the present time at 10 mg.  I am not sure that      he will tolerate much more.  2. Continue on Coumadin anticoagulation.     Arturo Morton. Riley Kill, MD, [facility]  Electronically Signed    TDS/MedQ  DD: 09/17/2006  DT: 09/17/2006  Job #: [id number]

## 2011-01-20 NOTE — Assessment & Plan Note (Signed)
[facility]                            CARDIOLOGY OFFICE NOTE   Adam, Brooks                      MRN:          [id number]  DATE:10/24/2006                            DOB:          [birth year]    [name] is in for followup. He has had a rare episode of right-sided  sharp chest pain. This has not been frequent and he actually feels quite  well. He has tolerated his medicines reasonably well and we now have him  on twice a day Coreg. He is not a fan of taking many medications, and we  talked today about the potential use of ACE inhibitors and/or ARBs.   CURRENT MEDICATIONS:  1. Coreg CR 10 mg daily.  2. Carvedilol 3.125 mg p.o. b.i.d.  3. Multivitamin daily.  4. Coumadin as directed.  5. Simvastatin 20 mg daily.  6. Levothyroxine 112 mcg daily.  7. Aspirin 81 mg daily.   PHYSICAL EXAMINATION:  The blood pressure is 112/72, pulse is 57. The  lung fields are clear. The PMI is nondisplaced. On careful examination,  I cannot appreciate a significant mitral regurgitation or murmur despite  the recent echocardiogram. Extremities reveal no edema.   IMPRESSION:  1. Prior myocardial infarction and subsequent coronary artery bypass      graft surgery.  2. Ischemic cardiomyopathy.  3. Paroxysmal atrial fibrillation with evidence of mode switching.  4. History of MADIT-CRT with BiV ICD.   PLAN:  1. Continue current medical regimen.  2. Consider use of ACE and/or ARB, although the patient feels great,      is remaining active, and prefers to limit medications with his      function status being class 1-2.     Arturo Morton. Riley Kill, MD, [facility]  Electronically Signed    TDS/MedQ  DD: 10/24/2006  DT: 10/24/2006  Job #: [id number]

## 2011-01-20 NOTE — Assessment & Plan Note (Signed)
[facility]                            CARDIOLOGY OFFICE NOTE   DARRYLL, RAJU                      MRN:          [id number]  DATE:09/10/2006                            DOB:          04/19/1923    Mr. Neis is in for followup.  He clinically is doing well.  He has not  been having any chest pain or shortness of breath.  On interrogation of  his __________ CRT device, the patient has had a fair amount of mode  switching with short runs of atrial fib, and some of these can get at  times fast.  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   His blood pressure is 118/69, pulse 59.  LUNG FIELDS:  Clear.  CARDIAC RHYTHM:  Regular.   His EKG reveals sinus bradycardia with ventricular pacing.   Given the slightly faster rates and the significant LV dysfunction, I  think what we are going to go do at this point in time is dc his Toprol  XL 25 mg daily.  We are going to start him on Coreg CR at 10 mg daily.  I have warned him about the potential side effects and asked him to do  very little on the initiation of therapy.  This will be done at home.  He will return to clinic in 1 week, at which time we will reassess his  status.  Our goal over time will be to titrate up the dose as much as he  tolerates.     Arturo Morton. Riley Kill, MD, [facility]  Electronically Signed    TDS/MedQ  DD: 09/10/2006  DT: 09/10/2006  Job #: [id number]   cc:   Theressa Millard, M.D.

## 2011-01-20 NOTE — H&P (Signed)
[facility]  Patient:    [name], [name] Visit Number: [id number] MRN: [id number]          Service Type: MED Location: [phone] Attending Physician:  Rollene Rotunda Dictated by:   Doylene Canard, M.D. Admit Date:  03/23/2002                           History and Physical  PRIMARY CARE Josejulian Tarango:  Theressa Millard, M.D.  CHIEF COMPLAINT:  Anterolateral MI.  HISTORY OF PRESENT ILLNESS:  The patient is a 77 year old white male with a history of hypertension and hypothyroidism who was in his usual state of health until approximately midnight on March 23, 2002.  He awoke with substernal chest pain.  He alerted his wife who drove him to [facility] where he was noted to have anterolateral ST segment elevation. I was contacted by the emergency department physician and arrangements were made to bring the patient for primary PCI.  I was later telephoned by the ED physician that the chest x-ray showed a question of a widened mediastinum. For this reason the patient was not given heparin.  A CT scan to rule out aortic dissection was attempted but the patient developed ventricular fibrillation requiring DC cardioversion, lidocaine and amiodarone therapy.  The patient was stabilized and brought to [facility] for further treatment.  PAST MEDICAL HISTORY: 1. Hypertension. 2. Hypothyroidism.  MEDICATIONS: 1. Aspirin 1 p.o. q.d. 2. Tenormin unknown dose. 3. Synthroid unknown dose. 4. "Two other medications according to the wife".  ALLERGIES:  The patient has no known drug allergies.  SOCIAL HISTORY:  The patient lives in [place], [place].  He is a retired Engineer, site.  He is very physically active according to the wife. He is essentially without functional limitation.  FAMILY HISTORY:  Unremarkable.  He has never smoked cigarettes.  He does not drink alcohol.  REVIEW OF SYSTEMS:  The patient had some nausea but was otherwise  without complaints.  He was notably very sedated from Morphine given in the emergency department.  PHYSICAL EXAMINATION:  VITAL SIGNS:  His blood pressure is 103/78.  His heart rate is 57.  He is saturating 95% on face mask, unknown rate.  GENERAL:  He is a sedated elderly male in moderate to severe distress.  SKIN:  Cool and clammy.  HEENT:  Unremarkable.  NECK: Without carotid bruits.   Jugular venous pulse is approximately 14 cm.  LUNGS:  Clear anteriorly and laterally.  CARDIOVASCULAR:  Revealed a regular rate and rhythm with occasional ectopy. He had no gallop.  He had no murmur.  He had no evidence of aortic insufficiency.  ABDOMEN:  Unremarkable.  EXTREMITIES:  Cool but with good distal pulses.  NEUROLOGIC:  The patient was sedated but nonfocal.  LABORATORY DATA:  Pending.  CHEST X-RAY:  Portable showed mild pulmonary edema and a questionable widened mediastinum.  The ECG revealed anterolateral ST segment elevation greater than 10 mm.  ASSESSMENT AND PLAN: 1. Acute anterolateral ST segment elevation myocardial infarction.  The    patient is currently undergoing primary percutaneous intervention.  We will    continue aspirin.  We will provide heparin and IIb IIIa inhibitor therapy.    We will continue metoprolol.  We will add an ACE inhibitor as tolerated.    We will also begin Statin therapy. 2. Ventricular fibrillation. Given this was in the setting of an acute    myocardial  infarction we will continue his amiodarone drip through his    reperfusion but then hopefully discontinue this therapy. 3. Hypothyroidism.  We will try to determine the dose of Synthroid he was    taking as an outpatient and continue that as an inpatient. Dictated by:   Doylene Canard, M.D. Attending Physician:  Rollene Rotunda DD:  03/23/02 TD:  03/26/02 Job: [id number]

## 2011-01-20 NOTE — Assessment & Plan Note (Signed)
[facility]                            CARDIOLOGY OFFICE NOTE   RICARDO, SCHUBACH                      MRN:          [id number]  DATE:07/23/2006                            DOB:          Jan 18, 1923    HISTORY OF PRESENT ILLNESS:  Mr. Odenthal is in today for followup. He was  recently noted on EP follow up to have mode switching consistent with  underlying atrial fibrillation. He is relatively asymptomatic relative  to this, but he has been instituted on Coumadin anticoagulation. He has  tolerated this well, and his INRs have been therapeutic. He has had some  bruising of the arm. He has had no change in his stools. He has remained  active and does use a four wheeler and hunts quite a bit.   On exam today, the blood pressure is 137/83, the pulse is 61. The lung  fields are clear, and the cardiac rhythm is regular. There is some  bruising over the arm. The extremities reveal trace edema.   IMPRESSION:  1. Ischemic cardiomyopathy status post coronary artery bypass graft      surgery.  2. Left ventricular dysfunction.  3. Paroxysmal atrial fibrillation.  4. History of involvement in MADIT CRT with BiV ICD.   PLAN:  1. Continue to monitor closely with rechecks of INR.  2. Return to clinic in 6 weeks.  3. Report any major problems.     Arturo Morton. Riley Kill, MD, [facility]  Electronically Signed    TDS/MedQ  DD: 07/23/2006  DT: 07/23/2006  Job #: [id number]   cc:   Theressa Millard, M.D.

## 2011-01-20 NOTE — Assessment & Plan Note (Signed)
[facility]                              CARDIOLOGY OFFICE NOTE   [name], [name]                      MRN:          [id number]  DATE:06/15/2006                            DOB:          05-31-1923    [name] is in for a followup visit.  In general he is stable.  He has not  been having any ongoing chest pain.  He feels well.  He has been into the  pacer clinic recently, seen Dr. Ladona Ridgel, and found to have mode-switching  compatible with paroxysmal atrial fibrillation.   EXAM:  Blood pressure is 110/75, pulse is 48.  The pacer site is well-healed.   Overall, the patient is stable.  He is having intermittent atrial  fibrillation.  I have discussed the implications of this with he and his  wife in great detail.  I do believe he should start on Coumadin.  He will  start at 2.5 mg daily.  We will have him see in the Coumadin clinic in  [place] on Tuesday.  I have explained some of the issues to him.  We will  see him back in followup in the cardiology clinic in 3 weeks.  Should he  have any problems in the interim, he is to give [place] a call.       Arturo Morton. Riley Kill, MD, [facility]     TDS/MedQ  DD:  06/15/2006  DT:  06/17/2006  Job #:  [id number]

## 2011-01-25 NOTE — Progress Notes (Signed)
icd remote check  

## 2011-01-26 ENCOUNTER — Encounter: Payer: Self-pay | Admitting: *Deleted

## 2011-02-07 ENCOUNTER — Ambulatory Visit (INDEPENDENT_AMBULATORY_CARE_PROVIDER_SITE_OTHER): Payer: Medicare Other | Admitting: Internal Medicine

## 2011-02-07 ENCOUNTER — Encounter: Payer: Self-pay | Admitting: Internal Medicine

## 2011-02-07 DIAGNOSIS — I5022 Chronic systolic (congestive) heart failure: Secondary | ICD-10-CM

## 2011-02-07 DIAGNOSIS — I4891 Unspecified atrial fibrillation: Secondary | ICD-10-CM

## 2011-02-07 DIAGNOSIS — I2589 Other forms of chronic ischemic heart disease: Secondary | ICD-10-CM

## 2011-02-07 NOTE — Patient Instructions (Signed)
Your physician recommends that you schedule a follow-up appointment in: 1 year  

## 2011-02-08 ENCOUNTER — Ambulatory Visit (INDEPENDENT_AMBULATORY_CARE_PROVIDER_SITE_OTHER): Payer: Medicare Other | Admitting: *Deleted

## 2011-02-08 ENCOUNTER — Encounter: Payer: Self-pay | Admitting: Internal Medicine

## 2011-02-08 DIAGNOSIS — I4891 Unspecified atrial fibrillation: Secondary | ICD-10-CM

## 2011-02-08 DIAGNOSIS — Z7901 Long term (current) use of anticoagulants: Secondary | ICD-10-CM

## 2011-02-08 LAB — POCT INR: INR: 1.7

## 2011-02-08 NOTE — Assessment & Plan Note (Signed)
His symptoms are currently class II. He will continue his current medications and maintain a low-sodium diet.

## 2011-02-08 NOTE — Progress Notes (Signed)
HPI [name] returns today for followup. He is a very pleasant 77 year old man with an ischemic cardiomyopathy, congestive heart failure, left bundle branch block, paroxysmal atrial fibrillation, status post ICD implantation. Previously the patient had been on amiodarone. It did not seem to help much with control of his atrial fibrillation. He denies syncope. He has had no ICD shocks. He denies peripheral edema. No chest pain. Not on File   Current Outpatient Prescriptions  Medication Sig Dispense Refill  . aspirin 81 MG tablet Take 81 mg by mouth daily.        . carvedilol (COREG) 3.125 MG tablet Take 3.125 mg by mouth daily.        . furosemide (LASIX) 40 MG tablet Take 40 mg by mouth daily.        Marland Kitchen levothyroxine (SYNTHROID, LEVOTHROID) 112 MCG tablet Take 112 mcg by mouth daily.        . Multiple Vitamin (MULTIVITAMIN) tablet Take 1 tablet by mouth daily. Take 1/2 tablet a day.       . pravastatin (PRAVACHOL) 40 MG tablet TAKE ONE TABLET BY MOUTH EVERY DAY  30 tablet  1  . warfarin (COUMADIN) 2 MG tablet Take 2 mg by mouth daily. As directed by Anticoagulation clinic.          Past Medical History  Diagnosis Date  . Hypertension   . Hypothyroid   . Mitral regurgitation   . Anticoagulation excessive     continued for both PAF & possible apical mural thrombus.    ROS:   All systems reviewed and negative except as noted in the HPI.   Past Surgical History  Procedure Date  . Coronary artery bypass graft   . Cardiac defibrillator placement     status post CABG surgery-2003 following AMI; ischemic cardiomyopathy with an EF of 25%; s/p AICD impolantation-2007; h/o LBBB; possible apical thrombus.     No family history on file.   History   Social History  . Marital Status: Married    Spouse Name: N/A    Number of Children: N/A  . Years of Education: N/A   Occupational History  . Not on file.   Social History Main Topics  . Smoking status: Unknown If Ever Smoked  .  Smokeless tobacco: Not on file  . Alcohol Use: No  . Drug Use: No  . Sexually Active:    Other Topics Concern  . Not on file   Social History Narrative  . No narrative on file     BP 105/70  Pulse 72  Ht 5\' 11"  (1.803 m)  Wt 183 lb (83.008 kg)  BMI 25.52 kg/m2  Physical Exam:  Well appearing NAD HEENT: Unremarkable Neck:  No JVD, no thyromegally Lymphatics:  No adenopathy Back:  No CVA tenderness Lungs:  Clear. Well-healed ICD incision. HEART:  Regular rate rhythm, no murmurs, no rubs, no clicks Abd:  Flat, positive bowel sounds, no organomegally, no rebound, no guarding Ext:  2 plus pulses, no edema, no cyanosis, no clubbing Skin:  No rashes no nodules Neuro:  CN II through XII intact, motor grossly intact  DEVICE  Normal device function.  See PaceArt for details.   Assess/Plan:

## 2011-02-08 NOTE — Assessment & Plan Note (Signed)
He denies anginal symptoms. He'll continue his current medications. 

## 2011-02-08 NOTE — Assessment & Plan Note (Signed)
He has no symptoms of atrial fibrillation. Today he is in sinus rhythm. He will continue anticoagulation.

## 2011-02-09 ENCOUNTER — Encounter: Payer: Medicare Other | Admitting: *Deleted

## 2011-02-09 ENCOUNTER — Other Ambulatory Visit: Payer: Self-pay | Admitting: Cardiology

## 2011-02-23 ENCOUNTER — Other Ambulatory Visit: Payer: Self-pay | Admitting: Internal Medicine

## 2011-03-02 ENCOUNTER — Encounter: Payer: Medicare Other | Admitting: *Deleted

## 2011-03-03 ENCOUNTER — Ambulatory Visit (INDEPENDENT_AMBULATORY_CARE_PROVIDER_SITE_OTHER): Payer: Medicare Other | Admitting: *Deleted

## 2011-03-03 DIAGNOSIS — I4891 Unspecified atrial fibrillation: Secondary | ICD-10-CM

## 2011-03-03 DIAGNOSIS — Z7901 Long term (current) use of anticoagulants: Secondary | ICD-10-CM

## 2011-03-03 NOTE — Patient Instructions (Signed)
SEE ANTICOAG AVS

## 2011-03-30 ENCOUNTER — Ambulatory Visit (INDEPENDENT_AMBULATORY_CARE_PROVIDER_SITE_OTHER): Payer: Medicare Other | Admitting: *Deleted

## 2011-03-30 DIAGNOSIS — I4891 Unspecified atrial fibrillation: Secondary | ICD-10-CM

## 2011-04-20 ENCOUNTER — Encounter: Payer: Medicare Other | Admitting: *Deleted

## 2011-04-24 ENCOUNTER — Other Ambulatory Visit: Payer: Self-pay | Admitting: Internal Medicine

## 2011-04-25 NOTE — Telephone Encounter (Signed)
rx request for coumadin  

## 2011-04-26 ENCOUNTER — Other Ambulatory Visit: Payer: Self-pay

## 2011-04-26 NOTE — Telephone Encounter (Signed)
.   Requested Prescriptions   Pending Prescriptions Disp Refills  . CARVEDILOL 3.125 MG PO TABS 30 tablet 6    Sig: Take 1 tablet (3.125 mg total) by mouth daily.   Pt LOV:02/07/11 w/ Dr Ladona Ridgel.

## 2011-04-27 ENCOUNTER — Ambulatory Visit (INDEPENDENT_AMBULATORY_CARE_PROVIDER_SITE_OTHER): Payer: Medicare Other | Admitting: *Deleted

## 2011-04-27 DIAGNOSIS — Z7901 Long term (current) use of anticoagulants: Secondary | ICD-10-CM

## 2011-04-27 DIAGNOSIS — I4891 Unspecified atrial fibrillation: Secondary | ICD-10-CM

## 2011-05-05 ENCOUNTER — Telehealth: Payer: Self-pay | Admitting: Cardiology

## 2011-05-05 MED ORDER — CARVEDILOL 3.125 MG PO TABS: 3.1250 mg | ORAL_TABLET | Freq: Every day | ORAL | Status: DC

## 2011-05-05 NOTE — Telephone Encounter (Signed)
coreg rx #[id number] needs called in to [facility] in [place]

## 2011-05-10 ENCOUNTER — Other Ambulatory Visit: Payer: Self-pay | Admitting: Cardiology

## 2011-05-11 ENCOUNTER — Encounter: Payer: Self-pay | Admitting: Cardiology

## 2011-05-11 ENCOUNTER — Encounter: Payer: Medicare Other | Admitting: *Deleted

## 2011-05-15 ENCOUNTER — Ambulatory Visit (INDEPENDENT_AMBULATORY_CARE_PROVIDER_SITE_OTHER): Payer: Medicare Other | Admitting: Cardiology

## 2011-05-15 ENCOUNTER — Encounter: Payer: Self-pay | Admitting: Cardiology

## 2011-05-15 DIAGNOSIS — E785 Hyperlipidemia, unspecified: Secondary | ICD-10-CM

## 2011-05-15 DIAGNOSIS — I10 Essential (primary) hypertension: Secondary | ICD-10-CM | POA: Insufficient documentation

## 2011-05-15 DIAGNOSIS — E039 Hypothyroidism, unspecified: Secondary | ICD-10-CM

## 2011-05-15 DIAGNOSIS — I480 Paroxysmal atrial fibrillation: Secondary | ICD-10-CM

## 2011-05-15 DIAGNOSIS — Z950 Presence of cardiac pacemaker: Secondary | ICD-10-CM | POA: Insufficient documentation

## 2011-05-15 DIAGNOSIS — I2510 Atherosclerotic heart disease of native coronary artery without angina pectoris: Secondary | ICD-10-CM | POA: Insufficient documentation

## 2011-05-15 DIAGNOSIS — Z7901 Long term (current) use of anticoagulants: Secondary | ICD-10-CM

## 2011-05-15 DIAGNOSIS — Z95810 Presence of automatic (implantable) cardiac defibrillator: Secondary | ICD-10-CM

## 2011-05-15 DIAGNOSIS — I4891 Unspecified atrial fibrillation: Secondary | ICD-10-CM

## 2011-05-15 MED ORDER — LISINOPRIL 5 MG PO TABS: 5.0000 mg | ORAL_TABLET | Freq: Every day | ORAL | Status: DC

## 2011-05-15 MED ORDER — CARVEDILOL 3.125 MG PO TABS: 6.2500 mg | ORAL_TABLET | Freq: Two times a day (BID) | ORAL | Status: DC

## 2011-05-15 NOTE — Assessment & Plan Note (Addendum)
He has done well with chronic anticoagulation.  We will continue to monitor CBC and stool Hemoccult testing to exclude occult GI blood loss.

## 2011-05-15 NOTE — Patient Instructions (Signed)
Your physician wants you to follow-up in:  8 MONTHS You will receive a reminder letter in the mail two months in advance. If you don't receive a letter, please call our office to schedule the follow-up appointment.    Your physician recommends that you return for lab work in:  ONE MONTH  INCREASE CARVEDILOL TO 6.25 MG TWICE DAILY  START LISINOPRIL 5 MG ONCE DAILY

## 2011-05-15 NOTE — Assessment & Plan Note (Signed)
Patient currently has no symptoms to suggest myocardial ischemia.  CHF is compensated.

## 2011-05-15 NOTE — Progress Notes (Signed)
HPI : [name] returns to the office as scheduled for continued assessment and treatment of ischemic heart disease with LV dysfunction, prior congestive heart failure and prophylactic AICD implantation.  He has done well in recent months with no chest discomfort, dyspnea, orthopnea or PND.  He has experienced no lightheadedness and suffered no spells of syncope.  He remains relatively active for his 88 years without symptoms related to exertion.  He follows weights closely at home, which has been stable.  Current Outpatient Prescriptions on File Prior to Visit  Medication Sig Dispense Refill  . aspirin 81 MG tablet Take 81 mg by mouth daily.        . carvedilol (COREG) 3.125 MG tablet Take 1 tablet (3.125 mg total) by mouth daily.  30 tablet  6  . furosemide (LASIX) 40 MG tablet TAKE ONE TABLET BY MOUTH EVERY DAY  30 tablet  6  . levothyroxine (SYNTHROID, LEVOTHROID) 112 MCG tablet Take 112 mcg by mouth daily.        . Multiple Vitamin (MULTIVITAMIN) tablet Take 1 tablet by mouth daily. Take 1/2 tablet a day.       . pravastatin (PRAVACHOL) 40 MG tablet TAKE ONE TABLET BY MOUTH EVERY DAY  30 tablet  6  . warfarin (COUMADIN) 2 MG tablet USE AS DIRECTED BY ANTICOAGULATION CLINIC  60 tablet  2     No Known Allergies    Past medical history, social history, and family history reviewed and updated.  ROS: See history of present illness.  PHYSICAL EXAM: BP 115/67  Pulse 63  Resp 18  Ht 6' (1.829 m)  Wt 182 lb 1.9 oz (82.609 kg)  BMI 24.70 kg/m2  SpO2 93%  General-Well developed; no acute distress Body habitus-proportionate weight and height Neck-No JVD; no carotid bruits Lungs-clear lung fields; resonant to percussion Cardiovascular-regular rhythm; normal PMI; normal S1 and S2; grade 1/6 systolic murmur at the left sternal border Abdomen-normal bowel sounds; soft and non-tender without masses or organomegaly Musculoskeletal-No deformities, no cyanosis or clubbing Neurologic-Normal  cranial nerves; symmetric strength and tone Skin-Warm, stasis changes in the lower extremities bilaterally Extremities-distal pulses intact; 1/2+ edema  EKG: Normal sinus rhythm with 100% atrial synchronous ventricular pacing.  No change since a previous tracing of 07/20/08.  ASSESSMENT AND PLAN:

## 2011-05-15 NOTE — Assessment & Plan Note (Signed)
Blood pressure control is excellent; continue current therapy.

## 2011-05-15 NOTE — Assessment & Plan Note (Addendum)
Patient was evaluated by Dr. Ladona Ridgel 2 months ago.  He was advised that a generator change would be necessary within the ensuing few months.  Device is monitored remotely, and return visit to see Dr. Ladona Ridgel is anticipated next year.

## 2011-05-15 NOTE — Assessment & Plan Note (Signed)
Adequate control of hyperlipidemia with current statin therapy.

## 2011-05-18 ENCOUNTER — Encounter: Payer: Self-pay | Admitting: *Deleted

## 2011-05-19 ENCOUNTER — Other Ambulatory Visit: Payer: Self-pay | Admitting: *Deleted

## 2011-05-19 MED ORDER — CARVEDILOL 6.25 MG PO TABS: 6.2500 mg | ORAL_TABLET | Freq: Two times a day (BID) | ORAL | Status: DC

## 2011-05-23 ENCOUNTER — Other Ambulatory Visit: Payer: Self-pay

## 2011-05-23 ENCOUNTER — Telehealth: Payer: Self-pay | Admitting: Cardiology

## 2011-05-23 DIAGNOSIS — E785 Hyperlipidemia, unspecified: Secondary | ICD-10-CM

## 2011-05-23 DIAGNOSIS — I2510 Atherosclerotic heart disease of native coronary artery without angina pectoris: Secondary | ICD-10-CM

## 2011-05-23 DIAGNOSIS — Z95810 Presence of automatic (implantable) cardiac defibrillator: Secondary | ICD-10-CM

## 2011-05-23 DIAGNOSIS — I480 Paroxysmal atrial fibrillation: Secondary | ICD-10-CM

## 2011-05-23 DIAGNOSIS — E039 Hypothyroidism, unspecified: Secondary | ICD-10-CM

## 2011-05-23 DIAGNOSIS — I10 Essential (primary) hypertension: Secondary | ICD-10-CM

## 2011-05-23 NOTE — Telephone Encounter (Signed)
S: Pt. walked into clinic with c/o fatigue and weakness. B: On last OV with Dr. Dietrich Pates on 05-15-11 pt. was advised to take Lisinopril 5 mg daily and to increase Carvedilol to 6.25 mg bid  A: Pt. c/o extreme fatigue, weakness and body aches since starting Lisinopril and increase in Carvedilol. Pt. states he did not take last nights dose of Lisinopril and is feeling a little better today. R: Pt. advised that we will contact him with Dr. Marvel Plan recommendations.

## 2011-05-23 NOTE — Telephone Encounter (Signed)
BMet D/C lisinopril. Valsartan 80 mg PO QD

## 2011-05-23 NOTE — Telephone Encounter (Signed)
Needs clarification on Coreg 3.125mg  / tg

## 2011-05-24 LAB — PROTIME-INR: INR: 1.4

## 2011-05-24 MED ORDER — VALSARTAN 80 MG PO TABS: 80.0000 mg | ORAL_TABLET | Freq: Every day | ORAL | Status: DC

## 2011-05-24 NOTE — Telephone Encounter (Signed)
Pt. Advised and he verbalized understanding./LV

## 2011-05-25 ENCOUNTER — Other Ambulatory Visit: Payer: Self-pay | Admitting: Internal Medicine

## 2011-05-25 ENCOUNTER — Other Ambulatory Visit: Payer: Self-pay | Admitting: Cardiology

## 2011-05-25 ENCOUNTER — Encounter: Payer: Medicare Other | Admitting: *Deleted

## 2011-05-25 ENCOUNTER — Encounter: Payer: Self-pay | Admitting: Internal Medicine

## 2011-05-25 ENCOUNTER — Ambulatory Visit (INDEPENDENT_AMBULATORY_CARE_PROVIDER_SITE_OTHER): Payer: Medicare Other | Admitting: *Deleted

## 2011-05-25 DIAGNOSIS — I428 Other cardiomyopathies: Secondary | ICD-10-CM

## 2011-05-25 LAB — HEPATIC FUNCTION PANEL
Albumin: 4.2 g/dL (ref 3.5–5.2)
Total Protein: 7 g/dL (ref 6.0–8.3)

## 2011-05-26 LAB — BASIC METABOLIC PANEL WITH GFR
BUN: 28 mg/dL — ABNORMAL HIGH (ref 6–23)
Calcium: 8.7 mg/dL (ref 8.4–10.5)
Chloride: 104 mEq/L (ref 96–112)
Creat: 1.36 mg/dL — ABNORMAL HIGH (ref 0.50–1.35)
Potassium: 4.3 mEq/L (ref 3.5–5.3)
Sodium: 142 mEq/L (ref 135–145)

## 2011-05-26 LAB — LIPID PANEL
Cholesterol: 140 mg/dL (ref 0–200)
Triglycerides: 58 mg/dL (ref <150)

## 2011-05-26 LAB — TSH: TSH: 1.742 u[IU]/mL (ref 0.350–4.500)

## 2011-05-28 ENCOUNTER — Encounter (HOSPITAL_COMMUNITY): Payer: Self-pay

## 2011-05-28 ENCOUNTER — Emergency Department (HOSPITAL_COMMUNITY): Payer: Medicare Other

## 2011-05-28 ENCOUNTER — Emergency Department (HOSPITAL_COMMUNITY)
Admission: EM | Admit: 2011-05-28 | Discharge: 2011-05-28 | Disposition: A | Discharge status: Home / Self Care | Payer: Medicare Other | Attending: Emergency Medicine | Admitting: Emergency Medicine

## 2011-05-28 ENCOUNTER — Other Ambulatory Visit: Payer: Self-pay

## 2011-05-28 DIAGNOSIS — I2510 Atherosclerotic heart disease of native coronary artery without angina pectoris: Secondary | ICD-10-CM | POA: Insufficient documentation

## 2011-05-28 DIAGNOSIS — I10 Essential (primary) hypertension: Secondary | ICD-10-CM | POA: Insufficient documentation

## 2011-05-28 DIAGNOSIS — R5383 Other fatigue: Secondary | ICD-10-CM

## 2011-05-28 DIAGNOSIS — R5381 Other malaise: Secondary | ICD-10-CM | POA: Insufficient documentation

## 2011-05-28 DIAGNOSIS — Z7982 Long term (current) use of aspirin: Secondary | ICD-10-CM | POA: Insufficient documentation

## 2011-05-28 DIAGNOSIS — I059 Rheumatic mitral valve disease, unspecified: Secondary | ICD-10-CM | POA: Insufficient documentation

## 2011-05-28 DIAGNOSIS — Z95810 Presence of automatic (implantable) cardiac defibrillator: Secondary | ICD-10-CM | POA: Insufficient documentation

## 2011-05-28 DIAGNOSIS — Z7901 Long term (current) use of anticoagulants: Secondary | ICD-10-CM | POA: Insufficient documentation

## 2011-05-28 DIAGNOSIS — R509 Fever, unspecified: Secondary | ICD-10-CM | POA: Insufficient documentation

## 2011-05-28 DIAGNOSIS — Z951 Presence of aortocoronary bypass graft: Secondary | ICD-10-CM | POA: Insufficient documentation

## 2011-05-28 DIAGNOSIS — I4891 Unspecified atrial fibrillation: Secondary | ICD-10-CM | POA: Insufficient documentation

## 2011-05-28 LAB — BASIC METABOLIC PANEL
CO2: 29 mEq/L (ref 19–32)
Calcium: 8.8 mg/dL (ref 8.4–10.5)
GFR calc Af Amer: 56 mL/min — ABNORMAL LOW (ref >60)
GFR calc non Af Amer: 47 mL/min — ABNORMAL LOW (ref >60)
Sodium: 135 mEq/L (ref 135–145)

## 2011-05-28 LAB — DIFFERENTIAL
Basophils Absolute: 0 10*3/uL (ref 0.0–0.1)
Basophils Relative: 1 % (ref 0–1)
Eosinophils Absolute: 0.1 10*3/uL (ref 0.0–0.7)
Eosinophils Relative: 1 % (ref 0–5)
Lymphs Abs: 0.6 10*3/uL — ABNORMAL LOW (ref 0.7–4.0)

## 2011-05-28 LAB — CBC
MCH: 31.2 pg (ref 26.0–34.0)
MCHC: 33.1 g/dL (ref 30.0–36.0)
MCV: 94.3 fL (ref 78.0–100.0)
Platelets: 110 10*3/uL — ABNORMAL LOW (ref 150–400)
RDW: 13.2 % (ref 11.5–15.5)

## 2011-05-28 LAB — URINALYSIS, ROUTINE W REFLEX MICROSCOPIC
Bilirubin Urine: NEGATIVE
Ketones, ur: NEGATIVE mg/dL
Nitrite: NEGATIVE
Protein, ur: NEGATIVE mg/dL
Urobilinogen, UA: 2 mg/dL — ABNORMAL HIGH (ref 0.0–1.0)

## 2011-05-28 LAB — PROTIME-INR
INR: 1.71 — ABNORMAL HIGH (ref 0.00–1.49)
Prothrombin Time: 20.4 seconds — ABNORMAL HIGH (ref 11.6–15.2)

## 2011-05-28 NOTE — ED Notes (Signed)
Pt ambulated in the hallway without dizzyness or other diff.   Pt tolerated well

## 2011-05-28 NOTE — ED Provider Notes (Signed)
History     CSN: [id number] Arrival date & time: 05/28/2011  3:11 AM Pt seen at 0340 No chief complaint on file.   HPI  (Consider location/radiation/quality/duration/timing/severity/associated sxs/prior treatment)  Patient is a 77 y.o. male presenting with weakness. The history is provided by the patient.  Weakness The primary symptoms include fever. Primary symptoms do not include headaches, loss of consciousness, altered mental status or vomiting. The symptoms began yesterday. The symptoms are unchanged. The neurological symptoms are diffuse.  Additional symptoms include weakness. Additional symptoms do not include lower back pain.   Pt reports for past day he has had fatigue, low grade fever Reports decreased PO intake No vomiting/diarrhea No abd/back pain No CP/SOB He does report urinary frequency that is improving over the past 24 hours, reports h/o urinary frequency No diarrhea/fecal incontinence reported No focal weakness reported He reports he felt his ICD "twinge" but no shocks and no significant CP reported  He reports that yesterday he stumbled, reports his "toe got caught" and he fell onto his left knee and has some pain in that knee but it is improving.  No other injury reported. Past Medical History  Diagnosis Date  . Hypertension   . Hypothyroid   . Chronic anticoagulation     continued for both PAF & possible apical mural thrombus.  . Arteriosclerotic cardiovascular disease (ASCVD) 2003    status post CABG surgery-2003 following AMI; ischemic cardiomyopathy with an EF of 25%; s/p AICD implantation-2007; h/o LBBB; possible apical thrombus   . Paroxysmal atrial fibrillation 2003  . AICD (automatic cardioverter/defibrillator) present   . Hyperlipidemia   . Mitral regurgitation     Mild by echo in 08/2007 but moderate to severe in 09/2008    Past Surgical History  Procedure Date  . Coronary artery bypass graft 2003  . Cardiac defibrillator placement 2007     History reviewed. No pertinent family history.  History  Substance Use Topics  . Smoking status: Unknown If Ever Smoked  . Smokeless tobacco: Not on file  . Alcohol Use: No      Review of Systems  Review of Systems  Constitutional: Positive for fever.  Gastrointestinal: Negative for vomiting.  Neurological: Positive for weakness. Negative for loss of consciousness and headaches.  Psychiatric/Behavioral: Negative for altered mental status.  All other systems reviewed and are negative.    Allergies  Review of patient's allergies indicates no known allergies.  Home Medications   Current Outpatient Rx  Name Route Sig Dispense Refill  . ASPIRIN 81 MG PO TABS Oral Take 81 mg by mouth daily.      Marland Kitchen CARVEDILOL 6.25 MG PO TABS Oral Take 1 tablet (6.25 mg total) by mouth 2 (two) times daily. 60 tablet 11  . FUROSEMIDE 40 MG PO TABS  TAKE ONE TABLET BY MOUTH EVERY DAY 30 tablet 6  . LEVOTHYROXINE SODIUM 112 MCG PO TABS Oral Take 112 mcg by mouth daily.      Marland Kitchen ONE-DAILY MULTI VITAMINS PO TABS Oral Take 1 tablet by mouth daily. Take 1/2 tablet a day.     Marland Kitchen PRAVASTATIN SODIUM 40 MG PO TABS  TAKE ONE TABLET BY MOUTH EVERY DAY 30 tablet 6  . WARFARIN SODIUM 2 MG PO TABS  USE AS DIRECTED BY ANTICOAGULATION CLINIC 60 tablet 2  . VALSARTAN 80 MG PO TABS Oral Take 1 tablet (80 mg total) by mouth daily. To replace Lisinopril 30 tablet 3    Physical Exam    BP 134/81  Pulse 93  Temp(Src) 98.6 F (37 C) (Oral)  Resp 20  Ht 6' (1.829 m)  Wt 170 lb (77.111 kg)  BMI 23.06 kg/m2  SpO2 100%  Physical Exam  CONSTITUTIONAL: Well developed/well nourished HEAD AND FACE: Normocephalic/atraumatic EYES: EOMI/PERRL ENMT: Mucous membranes moist NECK: supple no meningeal signs SPINE:entire spine nontender CV: no loud murmurs noted LUNGS: Lungs are clear to auscultation bilaterally, no apparent distress ABDOMEN: soft, nontender, no rebound or guarding GU:no cva tenderness, normal external  genitalia NEURO: Pt is awake/alert, moves all extremitiesx4 No arm/leg drift noted.  Pt is pleasant/talkative EXTREMITIES: pulses normal, full ROM, no significant tenderness noted to either knee, no deformity noted SKIN: warm, color normal PSYCH: no abnormalities of mood noted   ED Course  Procedures (including critical care time)  Labs Reviewed  CBC - Abnormal; Notable for the following:    Platelets 110 (*)    All other components within normal limits  DIFFERENTIAL - Abnormal; Notable for the following:    Lymphocytes Relative 11 (*)    Lymphs Abs 0.6 (*)    All other components within normal limits  PROTIME-INR - Abnormal; Notable for the following:    Prothrombin Time 20.4 (*)    INR 1.71 (*)    All other components within normal limits  BASIC METABOLIC PANEL  URINALYSIS, ROUTINE W REFLEX MICROSCOPIC     MDM Nursing notes reviewed and considered in documentation Previous records reviewed and considered All labs/vitals reviewed and considered xrays reviewed and considered    Date: 05/28/2011  Rate: 91  Rhythm: indeterminate  QRS Axis: left  Intervals: normal  ST/T Wave abnormalities: inverted t waves  Conduction Disutrbances:electronically paced  Narrative Interpretation:   Old EKG Reviewed: no significant change   BP 124/76  Pulse 93  Temp(Src) 98.6 F (37 C) (Oral)  Resp 20  Ht 6' (1.829 m)  Wt 170 lb (77.111 kg)  BMI 23.06 kg/m2  SpO2 96% Pt ambulatory He has no new complaints Chief complaint was chest pain, but pt admits he never really had any significant CP.  He was most concerned about possible fever/fatigue He is well appearing/nontoxic I advised close f/u on coumadin dosing and also for hematuria         Joya Gaskins, MD 05/28/11 [id number]

## 2011-05-28 NOTE — ED Notes (Signed)
Pt states he fell on Friday, has pain to left knee and some weakness.   Pt states he just felt weak Saturday and ran a low grade fever.    Pt states he just feels weak now.

## 2011-05-29 ENCOUNTER — Telehealth: Payer: Self-pay | Admitting: Cardiology

## 2011-05-29 ENCOUNTER — Other Ambulatory Visit (HOSPITAL_COMMUNITY): Payer: Self-pay | Admitting: Urology

## 2011-05-29 DIAGNOSIS — N390 Urinary tract infection, site not specified: Secondary | ICD-10-CM

## 2011-05-29 NOTE — Telephone Encounter (Signed)
Patient states that he is having problems w/ urine and running a fever / states that he went to ER on Saturday and they didn't find anything / I advised patient to refer to his PCP for this and he stated "he wanted to know if Dr.Rothbart wanted him to take another pill". / tg

## 2011-05-29 NOTE — Telephone Encounter (Signed)
Patient requires further assessment if fever does not resolve to determine the cause. In the absence of a specific etiology, appropriate medical therapy cannot be prescribed. These problems would best be evaluated by the patient's PCP.

## 2011-05-30 LAB — URINE CULTURE
Culture  Setup Time: 201209231956
Culture: NO GROWTH

## 2011-05-31 ENCOUNTER — Inpatient Hospital Stay (HOSPITAL_COMMUNITY)
Admission: EM | Admit: 2011-05-31 | Discharge: 2011-06-02 | DRG: 868 | Disposition: A | Discharge status: Home / Self Care | Payer: Medicare Other | Attending: Internal Medicine | Admitting: Internal Medicine

## 2011-05-31 ENCOUNTER — Encounter (HOSPITAL_COMMUNITY): Payer: Self-pay | Admitting: Emergency Medicine

## 2011-05-31 DIAGNOSIS — E876 Hypokalemia: Secondary | ICD-10-CM | POA: Diagnosis not present

## 2011-05-31 DIAGNOSIS — A779 Spotted fever, unspecified: Principal | ICD-10-CM | POA: Diagnosis present

## 2011-05-31 DIAGNOSIS — R350 Frequency of micturition: Secondary | ICD-10-CM | POA: Diagnosis present

## 2011-05-31 DIAGNOSIS — I10 Essential (primary) hypertension: Secondary | ICD-10-CM | POA: Diagnosis present

## 2011-05-31 DIAGNOSIS — Z951 Presence of aortocoronary bypass graft: Secondary | ICD-10-CM

## 2011-05-31 DIAGNOSIS — Z7901 Long term (current) use of anticoagulants: Secondary | ICD-10-CM | POA: Diagnosis present

## 2011-05-31 DIAGNOSIS — D696 Thrombocytopenia, unspecified: Secondary | ICD-10-CM | POA: Diagnosis present

## 2011-05-31 DIAGNOSIS — I129 Hypertensive chronic kidney disease with stage 1 through stage 4 chronic kidney disease, or unspecified chronic kidney disease: Secondary | ICD-10-CM | POA: Diagnosis present

## 2011-05-31 DIAGNOSIS — I480 Paroxysmal atrial fibrillation: Secondary | ICD-10-CM

## 2011-05-31 DIAGNOSIS — E039 Hypothyroidism, unspecified: Secondary | ICD-10-CM | POA: Diagnosis present

## 2011-05-31 DIAGNOSIS — R21 Rash and other nonspecific skin eruption: Secondary | ICD-10-CM | POA: Diagnosis present

## 2011-05-31 DIAGNOSIS — L03115 Cellulitis of right lower limb: Secondary | ICD-10-CM

## 2011-05-31 DIAGNOSIS — Z95810 Presence of automatic (implantable) cardiac defibrillator: Secondary | ICD-10-CM

## 2011-05-31 DIAGNOSIS — I4891 Unspecified atrial fibrillation: Secondary | ICD-10-CM | POA: Diagnosis present

## 2011-05-31 DIAGNOSIS — N179 Acute kidney failure, unspecified: Secondary | ICD-10-CM | POA: Diagnosis present

## 2011-05-31 DIAGNOSIS — N189 Chronic kidney disease, unspecified: Secondary | ICD-10-CM | POA: Diagnosis present

## 2011-05-31 DIAGNOSIS — I2510 Atherosclerotic heart disease of native coronary artery without angina pectoris: Secondary | ICD-10-CM | POA: Diagnosis present

## 2011-05-31 DIAGNOSIS — L03116 Cellulitis of left lower limb: Secondary | ICD-10-CM

## 2011-05-31 HISTORY — DX: Heart failure, unspecified: I50.9

## 2011-05-31 HISTORY — DX: Atherosclerotic heart disease of native coronary artery without angina pectoris: I25.10

## 2011-05-31 LAB — BASIC METABOLIC PANEL
BUN: 36 mg/dL — ABNORMAL HIGH (ref 6–23)
CO2: 29 mEq/L (ref 19–32)
Calcium: 9 mg/dL (ref 8.4–10.5)
Chloride: 97 mEq/L (ref 96–112)
Creatinine, Ser: 1.79 mg/dL — ABNORMAL HIGH (ref 0.50–1.35)
GFR calc Af Amer: 44 mL/min — ABNORMAL LOW (ref >60)
GFR calc non Af Amer: 36 mL/min — ABNORMAL LOW (ref >60)
Glucose, Bld: 98 mg/dL (ref 70–99)
Potassium: 4 mEq/L (ref 3.5–5.1)
Sodium: 135 mEq/L (ref 135–145)

## 2011-05-31 LAB — APTT: aPTT: 40 seconds — ABNORMAL HIGH (ref 24–37)

## 2011-05-31 LAB — DIFFERENTIAL
Basophils Absolute: 0 10*3/uL (ref 0.0–0.1)
Basophils Relative: 1 % (ref 0–1)
Eosinophils Absolute: 0.1 10*3/uL (ref 0.0–0.7)
Monocytes Absolute: 0.4 10*3/uL (ref 0.1–1.0)
Neutro Abs: 2 10*3/uL (ref 1.7–7.7)
Neutrophils Relative %: 62 % (ref 43–77)

## 2011-05-31 LAB — URINE MICROSCOPIC-ADD ON

## 2011-05-31 LAB — URINALYSIS, ROUTINE W REFLEX MICROSCOPIC
Leukocytes, UA: NEGATIVE
Nitrite: NEGATIVE
Protein, ur: NEGATIVE mg/dL
Specific Gravity, Urine: 1.02 (ref 1.005–1.030)
Urobilinogen, UA: 2 mg/dL — ABNORMAL HIGH (ref 0.0–1.0)

## 2011-05-31 LAB — CBC
MCH: 31.7 pg (ref 26.0–34.0)
MCHC: 34.2 g/dL (ref 30.0–36.0)
RDW: 13.2 % (ref 11.5–15.5)

## 2011-05-31 LAB — HEPATIC FUNCTION PANEL
ALT: 65 U/L — ABNORMAL HIGH (ref 0–53)
AST: 115 U/L — ABNORMAL HIGH (ref 0–37)
Alkaline Phosphatase: 138 U/L — ABNORMAL HIGH (ref 39–117)
Bilirubin, Direct: 0.7 mg/dL — ABNORMAL HIGH (ref 0.0–0.3)
Indirect Bilirubin: 0.9 mg/dL (ref 0.3–0.9)
Total Bilirubin: 1.6 mg/dL — ABNORMAL HIGH (ref 0.3–1.2)

## 2011-05-31 MED ORDER — DOXYCYCLINE HYCLATE 100 MG PO TABS
100.0000 mg | ORAL_TABLET | Freq: Once | ORAL | Status: AC
  Administered 2011-05-31: 100 mg via ORAL
  Filled 2011-05-31: qty 1

## 2011-05-31 MED ORDER — OLMESARTAN MEDOXOMIL 20 MG PO TABS: 10.0000 mg | ORAL_TABLET | Freq: Every day | ORAL | Status: DC

## 2011-05-31 MED ORDER — ONDANSETRON HCL 4 MG PO TABS: 4.0000 mg | ORAL_TABLET | Freq: Four times a day (QID) | ORAL | Status: DC | PRN

## 2011-05-31 MED ORDER — CIPROFLOXACIN IN D5W 400 MG/200ML IV SOLN
400.0000 mg | Freq: Once | INTRAVENOUS | Status: AC
  Administered 2011-05-31: 400 mg via INTRAVENOUS
  Filled 2011-05-31: qty 200

## 2011-05-31 MED ORDER — SODIUM CHLORIDE 0.9 % IV SOLN: 250.0000 mL | INTRAVENOUS | Status: DC

## 2011-05-31 MED ORDER — ONE-DAILY MULTI VITAMINS PO TABS
1.0000 | ORAL_TABLET | Freq: Every day | ORAL | Status: DC
  Filled 2011-05-31 (×2): qty 1

## 2011-05-31 MED ORDER — LEVOTHYROXINE SODIUM 112 MCG PO TABS
112.0000 ug | ORAL_TABLET | Freq: Every day | ORAL | Status: DC
  Administered 2011-06-01 – 2011-06-02 (×2): 112 ug via ORAL
  Filled 2011-05-31 (×5): qty 1

## 2011-05-31 MED ORDER — SODIUM CHLORIDE 0.9 % IJ SOLN
3.0000 mL | INTRAMUSCULAR | Status: DC | PRN
  Administered 2011-06-01: 3 mL via INTRAVENOUS
  Filled 2011-05-31: qty 3

## 2011-05-31 MED ORDER — ONDANSETRON HCL 4 MG/2ML IJ SOLN: 4.0000 mg | Freq: Four times a day (QID) | INTRAMUSCULAR | Status: DC | PRN

## 2011-05-31 MED ORDER — ACETAMINOPHEN 650 MG RE SUPP: 650.0000 mg | Freq: Four times a day (QID) | RECTAL | Status: DC | PRN

## 2011-05-31 MED ORDER — SIMVASTATIN 20 MG PO TABS
20.0000 mg | ORAL_TABLET | Freq: Every day | ORAL | Status: DC
  Administered 2011-06-01: 20 mg via ORAL
  Filled 2011-05-31: qty 1

## 2011-05-31 MED ORDER — ACETAMINOPHEN 325 MG PO TABS: 650.0000 mg | ORAL_TABLET | Freq: Four times a day (QID) | ORAL | Status: DC | PRN

## 2011-05-31 MED ORDER — CARVEDILOL 3.125 MG PO TABS
6.2500 mg | ORAL_TABLET | Freq: Two times a day (BID) | ORAL | Status: DC
  Administered 2011-06-01 – 2011-06-02 (×3): 6.25 mg via ORAL
  Filled 2011-05-31: qty 2
  Filled 2011-05-31: qty 1
  Filled 2011-05-31: qty 2

## 2011-05-31 MED ORDER — FUROSEMIDE 40 MG PO TABS
40.0000 mg | ORAL_TABLET | Freq: Every day | ORAL | Status: DC
  Administered 2011-06-01 – 2011-06-02 (×2): 40 mg via ORAL
  Filled 2011-05-31 (×2): qty 1

## 2011-05-31 MED ORDER — DOXYCYCLINE HYCLATE 100 MG IV SOLR: 100.0000 mg | Freq: Two times a day (BID) | INTRAVENOUS | Status: DC

## 2011-05-31 MED ORDER — DOXYCYCLINE HYCLATE 100 MG IV SOLR
100.0000 mg | Freq: Two times a day (BID) | INTRAVENOUS | Status: DC
  Administered 2011-06-01 – 2011-06-02 (×4): 100 mg via INTRAVENOUS
  Filled 2011-05-31 (×10): qty 100

## 2011-05-31 MED ORDER — SODIUM CHLORIDE 0.9 % IJ SOLN
3.0000 mL | Freq: Two times a day (BID) | INTRAMUSCULAR | Status: DC
  Administered 2011-06-01 – 2011-06-02 (×3): 3 mL via INTRAVENOUS
  Filled 2011-05-31 (×3): qty 3

## 2011-05-31 NOTE — ED Notes (Signed)
Pt c/o ble erythema/pain/burnging since yesterday.

## 2011-05-31 NOTE — H&P (Signed)
[name] is an 77 y.o. male.   Chief Complaint: Rash in the lower extremities HPI: 77 year old male with H/O CADS/P CABG, Afib on coumadin, HT, hypothyroidism, LV Dysfunction S/P AI CD and pacemaker placement has been experiencing rash in both lower extremities since last 24 hrs. Last few days he has been experiencing urinary frequency.Had gone to urologist and states his UA was normal. Had some subjective feeling of fever and chills last three days. Has had chigger bites in his lower extremities three weeks ago. In the ER patient is found to have petechial rash in the lower extremities with thrombocytopenia. Of note patient has noticed some joint stiffness ion his left knee.  Past Medical History  Diagnosis Date  . Hypertension   . Hypothyroid   . Chronic anticoagulation     continued for both PAF & possible apical mural thrombus.  . Arteriosclerotic cardiovascular disease (ASCVD) 2003    status post CABG surgery-2003 following AMI; ischemic cardiomyopathy with an EF of 25%; s/p AICD implantation-2007; h/o LBBB; possible apical thrombus   . Paroxysmal atrial fibrillation 2003  . AICD (automatic cardioverter/defibrillator) present   . Hyperlipidemia   . Mitral regurgitation     Mild by echo in 08/2007 but moderate to severe in 09/2008  . Coronary artery disease   . CHF (congestive heart failure)     Past Surgical History  Procedure Date  . Coronary artery bypass graft 2003  . Cardiac defibrillator placement 2007  . Insert / replace / remove pacemaker     History reviewed. No pertinent family history. Social History:  has an unknown smoking status. He does not have any smokeless tobacco history on file. He reports that he does not drink alcohol or use illicit drugs.  Allergies: No Known Allergies  Medications Prior to Admission  Medication Dose Route Frequency Provider Last Rate Last Dose  . ciprofloxacin (CIPRO) IVPB 400 mg  400 mg Intravenous Once Geoffery Lyons, MD 200 mL/hr  at 05/31/11 2218 400 mg at 05/31/11 2218  . doxycycline (VIBRA-TABS) tablet 100 mg  100 mg Oral Once Geoffery Lyons, MD   100 mg at 05/31/11 2218   Medications Prior to Admission  Medication Sig Dispense Refill  . aspirin 81 MG tablet Take 81 mg by mouth daily.        . carvedilol (COREG) 6.25 MG tablet Take 1 tablet (6.25 mg total) by mouth 2 (two) times daily.  60 tablet  11  . furosemide (LASIX) 40 MG tablet TAKE ONE TABLET BY MOUTH EVERY DAY  30 tablet  6  . levothyroxine (SYNTHROID, LEVOTHROID) 112 MCG tablet Take 112 mcg by mouth daily.        . Multiple Vitamin (MULTIVITAMIN) tablet Take 1 tablet by mouth daily. Take 1/2 tablet a day.       . pravastatin (PRAVACHOL) 40 MG tablet TAKE ONE TABLET BY MOUTH EVERY DAY  30 tablet  6  . valsartan (DIOVAN) 80 MG tablet Take 1 tablet (80 mg total) by mouth daily. To replace Lisinopril  30 tablet  3  . warfarin (COUMADIN) 2 MG tablet          Results for orders placed during the hospital encounter of 05/31/11 (from the past 48 hour(s))  CBC     Status: Abnormal   Collection Time   05/31/11  7:10 PM      Component Value Range Comment   WBC 3.2 (*) 4.0 - 10.5 (K/uL)    RBC 4.39  4.22 - 5.81 (MIL/uL)    Hemoglobin 13.9  13.0 - 17.0 (g/dL)    HCT 16.1  09.6 - 04.5 (%)    MCV 92.7  78.0 - 100.0 (fL)    MCH 31.7  26.0 - 34.0 (pg)    MCHC 34.2  30.0 - 36.0 (g/dL)    RDW 40.9  81.1 - 91.4 (%)    Platelets 83 (*) 150 - 400 (K/uL)   DIFFERENTIAL     Status: Normal   Collection Time   05/31/11  7:10 PM      Component Value Range Comment   Neutrophils Relative 62  43 - 77 (%)    Neutro Abs 2.0  1.7 - 7.7 (K/uL)    Lymphocytes Relative 21  12 - 46 (%)    Lymphs Abs 0.7  0.7 - 4.0 (K/uL)    Monocytes Relative 12  3 - 12 (%)    Monocytes Absolute 0.4  0.1 - 1.0 (K/uL)    Eosinophils Relative 4  0 - 5 (%)    Eosinophils Absolute 0.1  0.0 - 0.7 (K/uL)    Basophils Relative 1  0 - 1 (%)    Basophils Absolute 0.0  0.0 - 0.1 (K/uL)   BASIC METABOLIC  PANEL     Status: Abnormal   Collection Time   05/31/11  7:10 PM      Component Value Range Comment   Sodium 135  135 - 145 (mEq/L)    Potassium 4.0  3.5 - 5.1 (mEq/L)    Chloride 97  96 - 112 (mEq/L)    CO2 29  19 - 32 (mEq/L)    Glucose, Bld 98  70 - 99 (mg/dL)    BUN 36 (*) 6 - 23 (mg/dL)    Creatinine, Ser 7.82 (*) 0.50 - 1.35 (mg/dL)    Calcium 9.0  8.4 - 10.5 (mg/dL)    GFR calc non Af Amer 36 (*) >60 (mL/min)    GFR calc Af Amer 44 (*) >60 (mL/min)   CULTURE, BLOOD (ROUTINE X 2)     Status: Normal (Preliminary result)   Collection Time   05/31/11  7:10 PM      Component Value Range Comment   Specimen Description BLOOD LEFT ANTECUBITAL      Special Requests BOTTLES DRAWN AEROBIC AND ANAEROBIC 4CC      Culture PENDING      Report Status PENDING     HEPATIC FUNCTION PANEL     Status: Abnormal   Collection Time   05/31/11  7:10 PM      Component Value Range Comment   Total Protein 7.5  6.0 - 8.3 (g/dL)    Albumin 3.7  3.5 - 5.2 (g/dL)    AST 956 (*) 0 - 37 (U/L)    ALT 65 (*) 0 - 53 (U/L)    Alkaline Phosphatase 138 (*) 39 - 117 (U/L)    Total Bilirubin 1.6 (*) 0.3 - 1.2 (mg/dL)    Bilirubin, Direct 0.7 (*) 0.0 - 0.3 (mg/dL)    Indirect Bilirubin 0.9  0.3 - 0.9 (mg/dL)   PROTIME-INR     Status: Abnormal   Collection Time   05/31/11  7:10 PM      Component Value Range Comment   Prothrombin Time 19.8 (*) 11.6 - 15.2 (seconds)    INR 1.65 (*) 0.00 - 1.49    APTT     Status: Abnormal   Collection Time   05/31/11  7:10 PM  Component Value Range Comment   aPTT 40 (*) 24 - 37 (seconds)   URINALYSIS, ROUTINE W REFLEX MICROSCOPIC     Status: Abnormal   Collection Time   05/31/11  7:16 PM      Component Value Range Comment   Color, Urine YELLOW  YELLOW     Appearance CLEAR  CLEAR     Specific Gravity, Urine 1.020  1.005 - 1.030     pH 5.5  5.0 - 8.0     Glucose, UA NEGATIVE  NEGATIVE (mg/dL)    Hgb urine dipstick MODERATE (*) NEGATIVE     Bilirubin Urine NEGATIVE   NEGATIVE     Ketones, ur NEGATIVE  NEGATIVE (mg/dL)    Protein, ur NEGATIVE  NEGATIVE (mg/dL)    Urobilinogen, UA 2.0 (*) 0.0 - 1.0 (mg/dL)    Nitrite NEGATIVE  NEGATIVE     Leukocytes, UA NEGATIVE  NEGATIVE    URINE MICROSCOPIC-ADD ON     Status: Abnormal   Collection Time   05/31/11  7:16 PM      Component Value Range Comment   Squamous Epithelial / LPF FEW (*) RARE     WBC, UA 0-2  <3 (WBC/hpf)    RBC / HPF 3-6  <3 (RBC/hpf)    Bacteria, UA RARE  RARE     Urine-Other MUCOUS PRESENT      No results found.  Review of Systems  Constitutional: Positive for chills.  HENT: Negative.   Eyes: Negative.   Respiratory: Negative.   Cardiovascular: Negative.   Gastrointestinal: Negative.   Genitourinary: Positive for frequency.  Musculoskeletal: Negative.   Skin: Positive for rash.  Neurological: Negative.   Endo/Heme/Allergies: Negative.   Psychiatric/Behavioral: Negative.     Blood pressure 121/76, pulse 81, temperature 98.3 F (36.8 C), resp. rate 18, height 6' (1.829 m), weight 79.379 kg (175 lb), SpO2 100.00%. Physical Exam  Constitutional: He is oriented to person, place, and time. He appears well-developed and well-nourished.  HENT:  Head: Normocephalic and atraumatic.  Eyes: Conjunctivae and EOM are normal. Pupils are equal, round, and reactive to light.  Neck: Normal range of motion. Neck supple.  Cardiovascular: Normal rate and regular rhythm.   Respiratory: Effort normal and breath sounds normal.  GI: Soft. Bowel sounds are normal.  Musculoskeletal: Normal range of motion.  Neurological: He is alert and oriented to person, place, and time. He has normal reflexes.  Skin: Skin is warm and intact. Petechiae and rash noted. There is erythema.        Assessment/Plan 1)Bilateral lower extremity rash with thrombocytopenia with recent chigger bites. 2)ARF on Chronic kidney disease. 3)Urinary frequency. 4)Afib on coumadin 5)CAD S/P CABG and AI CD and pacemaker  placement.  Plan: Admit under [facility] Presently for his rash which is looking petechial with thrombocytopenia and the H/O chigger bites will get blood cultures and [place] fever assays and lyme titers. Will presently place patient on Doxycycline and ciprofloxacin. Closely follow his platelet counts, for now will continue coumadin per pharmacy but will hold off aspirin. For his acute on chronic renal failure will recheck creatinine in AM. Closely follow intake output. Will need hold ace inhibitors and lasix for any worsening of renal function. For urinary frequency will check urine culture.He is already on antibiotics for his rash. Thrombocytopenia with renal failure will check peripheral smear to look for schistocytes.  Eduard Clos 05/31/2011, 10:25 PM

## 2011-05-31 NOTE — ED Provider Notes (Signed)
History    Scribed for Adam Lyons, MD, the patient was seen in room APA03/APA03. This chart was scribed by Katha Cabal. This patient's care was started at 19:10.     CSN: [id number] Arrival date & time: 05/31/2011  6:46 PM  Chief Complaint  Patient presents with  . Leg Pain    (Consider location/radiation/quality/duration/timing/severity/associated sxs/prior treatment) HPI  ARK AGRUSA is a 77 y.o. male who presents to the Emergency Department complaining of gradual worsening of sudden onset of bilateral erythematous lower leg rash with mild pain that began yesterday.  Patient reports that his feet felt like "they were on fire and were hot" at 4 PM today.   Patient states that his legs are normally scaly and brown not red.  Patient states that he sudden urinary incontinence but states that he is being evaluated by his urologist.  Patient was seen in ED on 05/28/11 for mild chest pain and weakness and possible stroke and was discharged.  Patient is currently taking Coumadin, ASA, and thyroid medication.  Patient had 7 CABGs with a hx of hypothyroid, HTN, hyperlipidemia, and mitral regurgitation.    PCP Darnelle Bos, MD  [facility]     PAST MEDICAL HISTORY:  Past Medical History  Diagnosis Date  . Hypertension   . Hypothyroid   . Chronic anticoagulation     continued for both PAF & possible apical mural thrombus.  . Arteriosclerotic cardiovascular disease (ASCVD) 2003    status post CABG surgery-2003 following AMI; ischemic cardiomyopathy with an EF of 25%; s/p AICD implantation-2007; h/o LBBB; possible apical thrombus   . Paroxysmal atrial fibrillation 2003  . AICD (automatic cardioverter/defibrillator) present   . Hyperlipidemia   . Mitral regurgitation     Mild by echo in 08/2007 but moderate to severe in 09/2008    PAST SURGICAL HISTORY:  Past Surgical History  Procedure Date  . Coronary artery bypass graft 2003  . Cardiac defibrillator placement 2007     FAMILY HISTORY:  History reviewed. No pertinent family history.   SOCIAL HISTORY: History   Social History  . Marital Status: Married    Spouse Name: N/A    Number of Children: N/A  . Years of Education: N/A   Social History Main Topics  . Smoking status: Unknown If Ever Smoked  . Smokeless tobacco: None  . Alcohol Use: No  . Drug Use: No  . Sexually Active:    Other Topics Concern  . None   Social History Narrative  . None      Review of Systems 10 Systems reviewed and are negative for acute change except as noted in the HPI.  Allergies  Review of patient's allergies indicates no known allergies.  Home Medications   Current Outpatient Rx  Name Route Sig Dispense Refill  . ASPIRIN 81 MG PO TABS Oral Take 81 mg by mouth daily.      Marland Kitchen CARVEDILOL 6.25 MG PO TABS Oral Take 1 tablet (6.25 mg total) by mouth 2 (two) times daily. 60 tablet 11  . FUROSEMIDE 40 MG PO TABS  TAKE ONE TABLET BY MOUTH EVERY DAY 30 tablet 6  . LEVOTHYROXINE SODIUM 112 MCG PO TABS Oral Take 112 mcg by mouth daily.      Marland Kitchen ONE-DAILY MULTI VITAMINS PO TABS Oral Take 1 tablet by mouth daily. Take 1/2 tablet a day.     Marland Kitchen PRAVASTATIN SODIUM 40 MG PO TABS  TAKE ONE TABLET BY MOUTH EVERY DAY 30 tablet 6  .  VALSARTAN 80 MG PO TABS Oral Take 1 tablet (80 mg total) by mouth daily. To replace Lisinopril 30 tablet 3  . WARFARIN SODIUM 2 MG PO TABS         BP 121/76  Pulse 81  Temp 98.3 F (36.8 C)  Resp 18  Ht 6' (1.829 m)  Wt 175 lb (79.379 kg)  BMI 23.73 kg/m2  SpO2 100%  Physical Exam  Cardiovascular: Normal rate, regular rhythm and intact distal pulses.   Pulses:      Dorsalis pedis pulses are 2+ on the right side.       palpable Dorsalis pedis pulses.    Pulmonary/Chest: Effort normal and breath sounds normal.  Abdominal: Soft. There is no tenderness.  Skin: Skin is warm. Petechiae and rash noted. There is erythema.       Bilateral lower leg erythematous petechial rash that is  slightly warm to the the touch.    ED Course  Procedures (including critical care time)  OTHER DATA REVIEWED: Nursing notes, vital signs, and past medical records reviewed.   DIAGNOSTIC STUDIES: Oxygen Saturation is 100% on room air, normal by my interpretation.       LABS / RADIOLOGY:  Results for orders placed during the hospital encounter of 05/31/11  CBC      Component Value Range   WBC 3.2 (*) 4.0 - 10.5 (K/uL)   RBC 4.39  4.22 - 5.81 (MIL/uL)   Hemoglobin 13.9  13.0 - 17.0 (g/dL)   HCT 16.1  09.6 - 04.5 (%)   MCV 92.7  78.0 - 100.0 (fL)   MCH 31.7  26.0 - 34.0 (pg)   MCHC 34.2  30.0 - 36.0 (g/dL)   RDW 40.9  81.1 - 91.4 (%)   Platelets 83 (*) 150 - 400 (K/uL)  DIFFERENTIAL      Component Value Range   Neutrophils Relative 62  43 - 77 (%)   Neutro Abs 2.0  1.7 - 7.7 (K/uL)   Lymphocytes Relative 21  12 - 46 (%)   Lymphs Abs 0.7  0.7 - 4.0 (K/uL)   Monocytes Relative 12  3 - 12 (%)   Monocytes Absolute 0.4  0.1 - 1.0 (K/uL)   Eosinophils Relative 4  0 - 5 (%)   Eosinophils Absolute 0.1  0.0 - 0.7 (K/uL)   Basophils Relative 1  0 - 1 (%)   Basophils Absolute 0.0  0.0 - 0.1 (K/uL)  BASIC METABOLIC PANEL      Component Value Range   Sodium 135  135 - 145 (mEq/L)   Potassium 4.0  3.5 - 5.1 (mEq/L)   Chloride 97  96 - 112 (mEq/L)   CO2 29  19 - 32 (mEq/L)   Glucose, Bld 98  70 - 99 (mg/dL)   BUN 36 (*) 6 - 23 (mg/dL)   Creatinine, Ser 7.82 (*) 0.50 - 1.35 (mg/dL)   Calcium 9.0  8.4 - 95.6 (mg/dL)   GFR calc non Af Amer 36 (*) >60 (mL/min)   GFR calc Af Amer 44 (*) >60 (mL/min)  CULTURE, BLOOD (ROUTINE X 2)      Component Value Range   Specimen Description BLOOD LEFT ANTECUBITAL     Special Requests BOTTLES DRAWN AEROBIC AND ANAEROBIC 4CC     Culture PENDING     Report Status PENDING    URINALYSIS, ROUTINE W REFLEX MICROSCOPIC      Component Value Range   Color, Urine YELLOW  YELLOW    Appearance CLEAR  CLEAR    Specific Gravity, Urine 1.020  1.005 - 1.030     pH 5.5  5.0 - 8.0    Glucose, UA NEGATIVE  NEGATIVE (mg/dL)   Hgb urine dipstick MODERATE (*) NEGATIVE    Bilirubin Urine NEGATIVE  NEGATIVE    Ketones, ur NEGATIVE  NEGATIVE (mg/dL)   Protein, ur NEGATIVE  NEGATIVE (mg/dL)   Urobilinogen, UA 2.0 (*) 0.0 - 1.0 (mg/dL)   Nitrite NEGATIVE  NEGATIVE    Leukocytes, UA NEGATIVE  NEGATIVE   URINE MICROSCOPIC-ADD ON      Component Value Range   Squamous Epithelial / LPF FEW (*) RARE    WBC, UA 0-2  <3 (WBC/hpf)   RBC / HPF 3-6  <3 (RBC/hpf)   Bacteria, UA RARE  RARE    Urine-Other MUCOUS PRESENT    HEPATIC FUNCTION PANEL      Component Value Range   Total Protein 7.5  6.0 - 8.3 (g/dL)   Albumin 3.7  3.5 - 5.2 (g/dL)   AST 161 (*) 0 - 37 (U/L)   ALT 65 (*) 0 - 53 (U/L)   Alkaline Phosphatase 138 (*) 39 - 117 (U/L)   Total Bilirubin 1.6 (*) 0.3 - 1.2 (mg/dL)   Bilirubin, Direct 0.7 (*) 0.0 - 0.3 (mg/dL)   Indirect Bilirubin 0.9  0.3 - 0.9 (mg/dL)  PROTIME-INR      Component Value Range   Prothrombin Time 19.8 (*) 11.6 - 15.2 (seconds)   INR 1.65 (*) 0.00 - 1.49   APTT      Component Value Range   aPTT 40 (*) 24 - 37 (seconds)     No results found.     ED COURSE / COORDINATION OF CARE:  Orders Placed This Encounter  Procedures  . Culture, blood (routine x 2)  . CBC  . Differential  . Basic metabolic panel  . Urinalysis with microscopic  . Urine microscopic-add on  . Hepatic function panel  . Protime-INR  . APTT         MDM   MDM: Appears to be cellulitis, but surrounding rash appears petechial.   Will treat with antibiotics and consult [facility] for admission.   IMPRESSION: Diagnoses that have been ruled out:  Diagnoses that are still under consideration:  Final diagnoses:     MEDICATIONS GIVEN IN THE E.D. Scheduled Meds:   Continuous Infusions:      DISCHARGE MEDICATIONS: New Prescriptions   No medications on file     I personally performed the services described in this documentation,  which was scribed in my presence. The recorded information has been reviewed and considered. Adam Lyons, MD           Adam Lyons, MD 05/31/11 2139

## 2011-06-01 ENCOUNTER — Encounter: Payer: Medicare Other | Admitting: *Deleted

## 2011-06-01 ENCOUNTER — Ambulatory Visit (HOSPITAL_COMMUNITY): Payer: Medicare Other

## 2011-06-01 ENCOUNTER — Inpatient Hospital Stay (HOSPITAL_COMMUNITY): Payer: Medicare Other

## 2011-06-01 DIAGNOSIS — E876 Hypokalemia: Secondary | ICD-10-CM | POA: Diagnosis not present

## 2011-06-01 LAB — COMPREHENSIVE METABOLIC PANEL
ALT: 54 U/L — ABNORMAL HIGH (ref 0–53)
AST: 88 U/L — ABNORMAL HIGH (ref 0–37)
CO2: 29 mEq/L (ref 19–32)
Calcium: 8.4 mg/dL (ref 8.4–10.5)
Creatinine, Ser: 1.45 mg/dL — ABNORMAL HIGH (ref 0.50–1.35)
GFR calc Af Amer: 56 mL/min — ABNORMAL LOW (ref >60)
GFR calc non Af Amer: 46 mL/min — ABNORMAL LOW (ref >60)
Sodium: 135 mEq/L (ref 135–145)
Total Protein: 6.4 g/dL (ref 6.0–8.3)

## 2011-06-01 LAB — CBC
MCH: 31.5 pg (ref 26.0–34.0)
MCHC: 34.5 g/dL (ref 30.0–36.0)
MCV: 91.4 fL (ref 78.0–100.0)
Platelets: 79 10*3/uL — ABNORMAL LOW (ref 150–400)
RBC: 3.84 MIL/uL — ABNORMAL LOW (ref 4.22–5.81)

## 2011-06-01 LAB — PROTIME-INR
INR: 1.66 — ABNORMAL HIGH (ref 0.00–1.49)
Prothrombin Time: 19.9 seconds — ABNORMAL HIGH (ref 11.6–15.2)

## 2011-06-01 MED ORDER — WARFARIN SODIUM 5 MG PO TABS
5.0000 mg | ORAL_TABLET | ORAL | Status: AC
  Administered 2011-06-01: 5 mg via ORAL
  Filled 2011-06-01: qty 1

## 2011-06-01 MED ORDER — THERA M PLUS PO TABS
1.0000 | ORAL_TABLET | Freq: Every day | ORAL | Status: DC
  Administered 2011-06-01 – 2011-06-02 (×2): 1 via ORAL
  Filled 2011-06-01 (×2): qty 1

## 2011-06-01 MED ORDER — POTASSIUM CHLORIDE CRYS ER 20 MEQ PO TBCR
20.0000 meq | EXTENDED_RELEASE_TABLET | ORAL | Status: AC
  Administered 2011-06-01 (×2): 20 meq via ORAL
  Filled 2011-06-01 (×2): qty 1

## 2011-06-01 MED ORDER — CIPROFLOXACIN IN D5W 400 MG/200ML IV SOLN
400.0000 mg | Freq: Two times a day (BID) | INTRAVENOUS | Status: DC
  Administered 2011-06-01 – 2011-06-02 (×3): 400 mg via INTRAVENOUS
  Filled 2011-06-01 (×10): qty 200

## 2011-06-01 MED ORDER — SODIUM CHLORIDE 0.9 % IV SOLN
250.0000 mL | INTRAVENOUS | Status: DC
  Administered 2011-06-01: 250 mL via INTRAVENOUS

## 2011-06-01 MED ORDER — WARFARIN VIDEO
Freq: Once | Status: AC
  Administered 2011-06-01: 13:00:00
  Filled 2011-06-01: qty 1

## 2011-06-01 MED ORDER — DOXYCYCLINE HYCLATE 100 MG IV SOLR
INTRAVENOUS | Status: AC
  Filled 2011-06-01: qty 100

## 2011-06-01 MED ORDER — PATIENT'S GUIDE TO USING COUMADIN BOOK
Freq: Once | Status: AC
  Administered 2011-06-01: 13:00:00
  Filled 2011-06-01: qty 1

## 2011-06-01 MED ORDER — WARFARIN SODIUM 5 MG PO TABS
5.0000 mg | ORAL_TABLET | Freq: Once | ORAL | Status: AC
  Administered 2011-06-01: 5 mg via ORAL
  Filled 2011-06-01: qty 1

## 2011-06-01 NOTE — Progress Notes (Signed)
ANTICOAGULATION/ANTIBIOTIC CONSULT NOTE - Initial Consult  Pharmacy Consult for Warfarin and Ciprofloxacin Indication: Warfarin- Afib- Continuation of home therapy         Ciprofloxacin- Skin rash (RSMF?)    No Known Allergies  Patient Measurements: Height: 6' (182.9 cm) Weight: 178 lb 1.6 oz (80.786 kg) IBW/kg (Calculated) : 77.6  Adjusted Body Weight: N/A Vital Signs: Temp: 98.3 F (36.8 C) (09/27 0606) Temp src: Oral (09/27 0606) BP: 95/59 mmHg (09/27 0606) Pulse Rate: 72  (09/27 0606)  Labs:  Basename 06/01/11 0406 05/31/11 1910  HGB 12.1* 13.9  HCT 35.1* 40.7  PLT 79* 83*  APTT -- 40*  LABPROT 19.9* 19.8*  INR 1.66* 1.65*  HEPARINUNFRC -- --  CREATININE 1.45* 1.79*  CRCLEARANCE -- --  CKTOTAL -- --  CKMB -- --  TROPONINI -- --    Medical History: Past Medical History  Diagnosis Date  . Hypertension   . Hypothyroid   . Chronic anticoagulation     continued for both PAF & possible apical mural thrombus.  . Arteriosclerotic cardiovascular disease (ASCVD) 2003    status post CABG surgery-2003 following AMI; ischemic cardiomyopathy with an EF of 25%; s/p AICD implantation-2007; h/o LBBB; possible apical thrombus   . Paroxysmal atrial fibrillation 2003  . AICD (automatic cardioverter/defibrillator) present   . Hyperlipidemia   . Mitral regurgitation     Mild by echo in 08/2007 but moderate to severe in 09/2008  . Coronary artery disease   . CHF (congestive heart failure)     Medications:  Home dose 2mg  on Monday and Friday. 3 mg all other days. Also on Doxycycline 100 mg IV BID  Assessment: Subtherapeutic INR. Estimated Crcl around 40 mls per minute.  Goal of Therapy:  INR 2-3   Plan:   Warfarin 5mg  today. Ciprofloxacin 400mg  IV q12hrs. Daily INR   Gilman Buttner, [place] J 06/01/2011,7:54 AM

## 2011-06-01 NOTE — Progress Notes (Signed)
Subjective: Feeling better. No fevers and no chills. Patient denies any chest pain or shortness of breath. No acute overnight events.  Objective: Vital signs in last 24 hours: Temp:  [98.3 F (36.8 C)-98.6 F (37 C)] 98.3 F (36.8 C) (09/27 0606) Pulse Rate:  [72-84] 72  (09/27 0606) Resp:  [17-20] 17  (09/27 0606) BP: (95-135)/(59-78) 95/59 mmHg (09/27 0606) SpO2:  [92 %-100 %] 96 % (09/27 0606) Weight:  [79.379 kg (175 lb)-80.786 kg (178 lb 1.6 oz)] 178 lb 1.6 oz (80.786 kg) (09/27 0606) Weight change:  Last BM Date: 05/31/11  Physical Exam: General: Alert, awake, oriented x3, in no acute distress. HEENT: No bruits, no goiter. Heart: S1 and S2, no rubs, no JVD and no gallops. Lungs: Clear to auscultation bilaterally. Abdomen: Soft, nontender, nondistended, positive bowel sounds. Extremities: No clubbing cyanosis or edema; bilateral petechiae ras up to his mid calf. Neuro: Grossly intact, nonfocal.   Lab Results: Basic Metabolic Panel:  Basename 06/01/11 0406 05/31/11 1910  NA 135 135  K 2.9* 4.0  CL 98 97  CO2 29 29  GLUCOSE 117* 98  BUN 31* 36*  CREATININE 1.45* 1.79*  CALCIUM 8.4 9.0  MG -- --  PHOS -- --   Liver Function Tests:  Basename 06/01/11 0406 05/31/11 1910  AST 88* 115*  ALT 54* 65*  ALKPHOS 119* 138*  BILITOT 1.2 1.6*  PROT 6.4 7.5  ALBUMIN 3.0* 3.7   No results found for this basename: LIPASE:2,AMYLASE:2 in the last 72 hours No results found for this basename: AMMONIA:2 in the last 72 hours CBC:  Basename 06/01/11 0406 05/31/11 1910  WBC 2.7* 3.2*  NEUTROABS -- 2.0  HGB 12.1* 13.9  HCT 35.1* 40.7  MCV 91.4 92.7  PLT 79* 83*    Misc. Labs:  Recent Results (from the past 240 hour(s))  URINE CULTURE     Status: Normal   Collection Time   05/28/11  4:55 AM      Component Value Range Status Comment   Specimen Description URINE, CLEAN CATCH   Final    Special Requests NONE   Final    Setup Time [id number]   Final    Colony Count NO  GROWTH   Final    Culture NO GROWTH   Final    Report Status 05/29/2011 FINAL   Final   CULTURE, BLOOD (ROUTINE X 2)     Status: Normal (Preliminary result)   Collection Time   05/31/11  7:10 PM      Component Value Range Status Comment   Specimen Description BLOOD LEFT ANTECUBITAL   Final    Special Requests BOTTLES DRAWN AEROBIC AND ANAEROBIC 4CC   Final    Culture NO GROWTH 1 DAY   Final    Report Status PENDING   Incomplete   CULTURE, BLOOD (ROUTINE X 2)     Status: Normal (Preliminary result)   Collection Time   05/31/11  7:16 PM      Component Value Range Status Comment   Specimen Description Blood   Final    Special Requests NONE   Final    Culture NO GROWTH 1 DAY   Final    Report Status PENDING   Incomplete     Studies/Results: US Venous Img Lower Bilateral  06/01/2011  *RADIOLOGY REPORT*  Clinical Data: Leg swelling and erythema  VENOUS DUPLEX ULTRASOUND OF BILATERAL LOWER EXTREMITIES  Technique:  Gray-scale sonography with graded compression, as well as color Doppler and duplex ultrasound,  were performed to evaluate the deep venous system of both lower extremities from the level of the common femoral vein through the popliteal and proximal calf veins.  Spectral Doppler was utilized to evaluate flow at rest and with distal augmentation maneuvers.  Comparison:  September 04, 2007  Findings: There is good lumen compression or flow augmentation within the common femoral veins, profunda femoral veins, superficial femoral veins, and popliteal veins bilaterally.  There are no intraluminal filling defects.  IMPRESSION: There is no evidence of deep venous thrombosis bilaterally.  Original Report Authenticated By: Brandon Melnick, M.D.    Medications: Scheduled Meds:   . carvedilol  6.25 mg Oral BID  . ciprofloxacin  400 mg Intravenous Once  . ciprofloxacin  400 mg Intravenous Q12H  . doxycycline  100 mg Oral Once  . doxycycline (VIBRAMYCIN) IV  100 mg Intravenous Q12H  . furosemide   40 mg Oral Daily  . levothyroxine  112 mcg Oral Daily  . multivitamins ther. w/minerals  1 tablet Oral Daily  . patient's guide to using coumadin book   Does not apply Once  . potassium chloride  20 mEq Oral Q4H  . simvastatin  20 mg Oral q1800  . sodium chloride  3 mL Intravenous Q12H  . warfarin  5 mg Oral NOW  . warfarin  5 mg Oral ONCE-1800  . warfarin   Does not apply Once  . DISCONTD: doxycycline (VIBRAMYCIN) IV  100 mg Intravenous Q12H  . DISCONTD: multivitamin  1 tablet Oral Daily  . DISCONTD: olmesartan  10 mg Oral Daily   Continuous Infusions:   . sodium chloride    . DISCONTD: sodium chloride     PRN Meds:.acetaminophen, acetaminophen, ondansetron (ZOFRAN) IV, ondansetron, sodium chloride  Assessment/Plan: 1-Rash and nonspecific skin eruption- at this point still etiology is unclear; Will continue empiric tx for [place] spotting fever and will follow labs results. No elevation of white blood cells and lower stridor Dopplers negative for any clots or cellulitis appearance. If condition failed to improved will ask for dermatology consult. 2- Chronic anticoagulation- due to paroxysmal atrial fibrillation, will continue Coumadin per pharmacy. 3-Hypertension- soft. We'll hold off on Diovan and follow the vital signs. 4- Paroxysmal atrial fibrillation- continue Coumadin and also Coreg. Patient's rate is currently well controlled. 5-Thrombocytopenia- will follow peripheral smear and will follow platelets trend. Avoid heparin products. 6-Renal failure (ARF), acute on chronic- slightly better, will hold off on patient's ARB; Will give gentle fluid resuscitation and will follow serum creatinine trend. 7-Hypokalemia- Will check Mg level and will replete potassium. 8-HLD- continue statins. 9-Chronic CHF- compensated. Continue home meds for now.    LOS: 1 day   [name] 06/01/2011, 2:07 PM

## 2011-06-02 ENCOUNTER — Encounter: Payer: Self-pay | Admitting: *Deleted

## 2011-06-02 LAB — CBC
MCH: 30.8 pg (ref 26.0–34.0)
MCHC: 33.7 g/dL (ref 30.0–36.0)
Platelets: 91 10*3/uL — ABNORMAL LOW (ref 150–400)

## 2011-06-02 LAB — B. BURGDORFI ANTIBODIES: B burgdorferi Ab IgG+IgM: 0.19 {ISR}

## 2011-06-02 LAB — BASIC METABOLIC PANEL
Calcium: 8.4 mg/dL (ref 8.4–10.5)
Creatinine, Ser: 1.28 mg/dL (ref 0.50–1.35)
GFR calc non Af Amer: 53 mL/min — ABNORMAL LOW (ref >60)
Sodium: 136 mEq/L (ref 135–145)

## 2011-06-02 LAB — MAGNESIUM: Magnesium: 1.9 mg/dL (ref 1.5–2.5)

## 2011-06-02 LAB — PATHOLOGIST SMEAR REVIEW

## 2011-06-02 LAB — PROTIME-INR: Prothrombin Time: 25.5 seconds — ABNORMAL HIGH (ref 11.6–15.2)

## 2011-06-02 MED ORDER — CIPROFLOXACIN HCL 250 MG PO TABS: 250.0000 mg | ORAL_TABLET | Freq: Two times a day (BID) | ORAL | Status: AC

## 2011-06-02 MED ORDER — WARFARIN SODIUM 2 MG PO TABS: 3.0000 mg | ORAL_TABLET | Freq: Once | ORAL | Status: DC

## 2011-06-02 MED ORDER — DOXYCYCLINE HYCLATE 100 MG PO TABS: 100.0000 mg | ORAL_TABLET | Freq: Two times a day (BID) | ORAL | Status: DC

## 2011-06-02 MED ORDER — DOXYCYCLINE HYCLATE 100 MG PO TABS: 100.0000 mg | ORAL_TABLET | Freq: Two times a day (BID) | ORAL | Status: AC

## 2011-06-02 MED ORDER — CIPROFLOXACIN HCL 250 MG PO TABS: 250.0000 mg | ORAL_TABLET | Freq: Two times a day (BID) | ORAL | Status: DC

## 2011-06-02 NOTE — Progress Notes (Signed)
PIV removed without complaint, patient hemodynamically stable upon discharge. Patient verbalizes understanding of discharge instructions, follow up appointments and prescriptions. Patient escorted out by staff, transported by family. 

## 2011-06-02 NOTE — Discharge Summary (Signed)
Physician Discharge Summary  Patient ID: [name] MRN: [id number] DOB/AGE: 12-09-1922 77 y.o.  Admit date: 05/31/2011 Discharge date: 06/02/2011  Primary Care Physician:  Darnelle Bos, MD, MD   Discharge Diagnoses:    Present on Admission:  .Rash and nonspecific skin eruption .Thrombocytopenia .Renal failure (ARF), acute on chronic .Increased urinary frequency .Chronic anticoagulation .Hypertension .Paroxysmal atrial fibrillation  Current Discharge Medication List    START taking these medications   Details  ciprofloxacin (CIPRO) 250 MG tablet Take 1 tablet (250 mg total) by mouth 2 (two) times daily. Qty: 16 tablet, Refills: 0    doxycycline (VIBRA-TABS) 100 MG tablet Take 1 tablet (100 mg total) by mouth every 12 (twelve) hours. Qty: 16 tablet, Refills: 0      CONTINUE these medications which have NOT CHANGED   Details  aspirin 81 MG tablet Take 81 mg by mouth daily.      carvedilol (COREG) 6.25 MG tablet Take 1 tablet (6.25 mg total) by mouth 2 (two) times daily. Qty: 60 tablet, Refills: 11    furosemide (LASIX) 40 MG tablet TAKE ONE TABLET BY MOUTH EVERY DAY Qty: 30 tablet, Refills: 6    levothyroxine (SYNTHROID, LEVOTHROID) 112 MCG tablet Take 112 mcg by mouth daily.      Multiple Vitamin (MULTIVITAMIN) tablet Take 1 tablet by mouth daily. Take 1/2 tablet a day.     pravastatin (PRAVACHOL) 40 MG tablet TAKE ONE TABLET BY MOUTH EVERY DAY Qty: 30 tablet, Refills: 6    warfarin (COUMADIN) 2 MG tablet Take 3 mg by mouth daily. Takes one and one-half tablet daily to = 3mg .     valsartan (DIOVAN) 80 MG tablet Take 1 tablet (80 mg total) by mouth daily. To replace Lisinopril Qty: 30 tablet, Refills: 3         Disposition and Follow-up:  Patient advised to arrange followup visit over the next 7 days with primary care physician and also to follow with Coumadin clinic next week especially now that he is taking antibiotics for further adjustment of  his warfarin. Patient will follow a low sodium diet/heart healthy diet and will take medication as prescribed. It's important that during his followup visit, a CBC is repeated in order to follow patient's platelet count and also to make sure that skin rash on his lower extremeties has completely resolved.   Consults:  None  Significant Diagnostic Studies:  US Venous Img Lower Bilateral  06/01/2011  *RADIOLOGY REPORT*  Clinical D. ata: Leg swelling and erythema  VENOUS DUPLEX ULTRASOUND OF BILATERAL LOWER EXTREMITIES  Technique:  Gray-scale sonography with graded compression, as well as color Doppler and duplex ultrasound, were performed to evaluate the deep venous system of both lower extremities from the level of the common femoral vein through the popliteal and proximal calf veins.  Spectral Doppler was utilized to evaluate flow at rest and with distal augmentation maneuvers.  Comparison:  September 04, 2007  Findings: There is good lumen compression or flow augmentation within the common femoral veins, profunda femoral veins, superficial femoral veins, and popliteal veins bilaterally.  There are no intraluminal filling defects.  IMPRESSION: There is no evidence of deep venous thrombosis bilaterally.  Original Report Authenticated By: Brandon Melnick, M.D.    Brief H and P: For complete details please refer to admission H and P, but in brief 77 year old male with a past medical history significant for paroxysmal atrial fibrillation, hypertension, ischemic cardiomyopathy, hypothyroidism and chronic kidney disease; needed secondary to lower sternum and  the rash and thrombocytopenia with associated worsening of patient's kidney function. Patient has been bitten by some insects on his leg about 2 weeks or so prior to admission and is now presenting with this abnormalities.    Hospital Course:  1-Rash and nonspecific skin eruption- normal cytopenia there is a high concerns that the patient rash might be  secondary to RMSF or Lyme; titles are pending, but tx with doxycycline and ciprofloxacin was started; patient received fluid resuscitation and at discharge vital signs, platelets count and labs in general where significantly improved. He will follow with PCP for further evaluation and tx.  2-Chronic anticoagulation- a discharge INR was therapeutic, the patient will continue taking warfarin as previously indicated and will follow at the Coumadin clinic for further adjustment of his warfarin.   3-Hypertension- stable, patient advised to follow a low-sodium diet; now that his kidney function is back to baseline we'll restart her Lasix and also he is ACE inhibitor, patient will follow with PCP for further adjustment of his antihypertensive drugs.  4- Paroxysmal atrial fibrillation- continue coreg and Coumadin. 5-Thrombocytopenia- on discharge platelet count was in the 90,000 range and climbing. He will follow with PCP.  6-Renal failure (ARF), acute on chronic- after fluid resuscitation kidney function back to baseline, patient advised to keep himself hydrated and will follow with PCP for further followup of his kidney function as an outpatient. 7-Increased urinary frequency- continue workup per urologist as an outpatient. 8- Hypokalemia- repleted and back to normal range at discharge. It   Time spent on Discharge: 45 minutes  Signed: Pieper Kasik 06/02/2011, 3:51 PM

## 2011-06-02 NOTE — Progress Notes (Signed)
ANTICOAGULATION/ANTIBIOTIC CONSULT NOTE  Pharmacy Consult for Warfarin and Ciprofloxacin Indication: Warfarin- Afib- Continuation of home therapy         Ciprofloxacin- Skin rash (RSMF?)    No Known Allergies  Patient Measurements: Height: 6' (182.9 cm) Weight: 182 lb 1.6 oz (82.6 kg) IBW/kg (Calculated) : 77.6  Adjusted Body Weight: N/A Vital Signs: Temp: 98.5 F (36.9 C) (09/28 0550) Temp src: Oral (09/28 0550) BP: 133/79 mmHg (09/28 0550) Pulse Rate: 82  (09/28 0550)  Labs:  Basename 06/02/11 0529 06/01/11 0406 05/31/11 1910  HGB 12.9* 12.1* --  HCT 38.3* 35.1* 40.7  PLT 91* 79* 83*  APTT -- -- 40*  LABPROT 25.5* 19.9* 19.8*  INR 2.28* 1.66* 1.65*  HEPARINUNFRC -- -- --  CREATININE 1.28 1.45* 1.79*  CRCLEARANCE -- -- --  CKTOTAL -- -- --  CKMB -- -- --  TROPONINI -- -- --   INR Last Three Days: Recent Labs  Basename 06/02/11 0529 06/01/11 0406 05/31/11 1910   INR 2.28* 1.66* 1.65*      Medical History: Past Medical History  Diagnosis Date  . Hypertension   . Hypothyroid   . Chronic anticoagulation     continued for both PAF & possible apical mural thrombus.  . Arteriosclerotic cardiovascular disease (ASCVD) 2003    status post CABG surgery-2003 following AMI; ischemic cardiomyopathy with an EF of 25%; s/p AICD implantation-2007; h/o LBBB; possible apical thrombus   . Paroxysmal atrial fibrillation 2003  . AICD (automatic cardioverter/defibrillator) present   . Hyperlipidemia   . Mitral regurgitation     Mild by echo in 08/2007 but moderate to severe in 09/2008  . Coronary artery disease   . CHF (congestive heart failure)     Medications:  Home dose 2mg  on Monday and Friday. 3 mg all other days. Also on Doxycycline 100 mg IV BID  Assessment: Therapeutic INR. Estimated Crcl around 40 mls per minute. Home Dose 3mg  Daily  Goal of Therapy:  INR 2-3   Plan:   Warfarin 3mg  today. Ciprofloxacin 400mg  IV q12hrs. Daily INR   Mady Gemma 06/02/2011,11:10 AM

## 2011-06-05 ENCOUNTER — Telehealth: Payer: Self-pay | Admitting: Internal Medicine

## 2011-06-05 LAB — CULTURE, BLOOD (ROUTINE X 2)
Culture  Setup Time: 201209281432
Culture: NO GROWTH

## 2011-06-05 NOTE — Telephone Encounter (Signed)
Pt states that he was told battery would need to replaced before December. Everything I see for follow up states he is due back in one year for follow up.

## 2011-06-06 NOTE — Telephone Encounter (Signed)
Spoke with patient and he is aware that his battery has not reached ERI.  He will continue to me monitored by [employer] and follow up as scheduled.

## 2011-06-07 LAB — REMOTE ICD DEVICE
ATRIAL PACING ICD: 25 pct
CHARGE TIME: 11 s
LV LEAD IMPEDENCE ICD: 580 Ohm
PACEART VT: 0
RV LEAD IMPEDENCE ICD: 518 Ohm
TOT-0006: 20120816000000
TZAT-0001FASTVT: 1
TZAT-0001FASTVT: 2
TZAT-0002FASTVT: NEGATIVE
TZAT-0013FASTVT: 6
TZAT-0018FASTVT: NEGATIVE
TZST-0001FASTVT: 4
TZST-0001FASTVT: 5
TZST-0003FASTVT: 26 J
TZST-0003FASTVT: 31 J
TZST-0003FASTVT: 31 J
VENTRICULAR PACING ICD: 99 pct

## 2011-06-08 ENCOUNTER — Ambulatory Visit (INDEPENDENT_AMBULATORY_CARE_PROVIDER_SITE_OTHER): Payer: Medicare Other | Admitting: *Deleted

## 2011-06-08 DIAGNOSIS — I4891 Unspecified atrial fibrillation: Secondary | ICD-10-CM

## 2011-06-08 DIAGNOSIS — Z7901 Long term (current) use of anticoagulants: Secondary | ICD-10-CM

## 2011-06-08 LAB — POCT INR: INR: 2.9

## 2011-06-08 NOTE — Progress Notes (Signed)
icd checked by remote  

## 2011-06-09 LAB — DIFFERENTIAL
Basophils Absolute: 0
Eosinophils Absolute: 0.2
Eosinophils Relative: 2
Eosinophils Relative: 3
Lymphocytes Relative: 10 — ABNORMAL LOW
Lymphocytes Relative: 11 — ABNORMAL LOW
Lymphs Abs: 1.1
Lymphs Abs: 1.4
Monocytes Absolute: 1.1 — ABNORMAL HIGH
Monocytes Absolute: 1.1 — ABNORMAL HIGH
Monocytes Relative: 10
Neutro Abs: 9.7 — ABNORMAL HIGH

## 2011-06-09 LAB — PROTIME-INR
INR: 1.5
INR: 2.1 — ABNORMAL HIGH
Prothrombin Time: 19.1 — ABNORMAL HIGH
Prothrombin Time: 24.5 — ABNORMAL HIGH
Prothrombin Time: 27.3 — ABNORMAL HIGH

## 2011-06-09 LAB — BASIC METABOLIC PANEL
BUN: 25 — ABNORMAL HIGH
CO2: 27
Calcium: 8.6
Calcium: 8.8
Chloride: 106
Creatinine, Ser: 1.41
GFR calc Af Amer: 58 — ABNORMAL LOW
GFR calc Af Amer: >60
GFR calc non Af Amer: 48 — ABNORMAL LOW
GFR calc non Af Amer: >60
GFR calc non Af Amer: >60
Glucose, Bld: 120 — ABNORMAL HIGH
Potassium: 3.9
Potassium: 4.4
Sodium: 138
Sodium: 140
Sodium: 141

## 2011-06-09 LAB — HEMOGLOBIN AND HEMATOCRIT, BLOOD
HCT: 29.8 — ABNORMAL LOW
Hemoglobin: 10 — ABNORMAL LOW

## 2011-06-09 LAB — CULTURE, BLOOD (ROUTINE X 2)
Culture: NO GROWTH
Culture: NO GROWTH
Report Status: 1032009
Report Status: 1032009

## 2011-06-09 LAB — CBC
HCT: 29.4 — ABNORMAL LOW
HCT: 33.4 — ABNORMAL LOW
Hemoglobin: 10.1 — ABNORMAL LOW
Hemoglobin: 11.2 — ABNORMAL LOW
MCV: 92.7
RDW: 13.6
WBC: 11.4 — ABNORMAL HIGH
WBC: 12.6 — ABNORMAL HIGH

## 2011-06-09 LAB — APTT: aPTT: 48 — ABNORMAL HIGH

## 2011-07-06 ENCOUNTER — Ambulatory Visit (INDEPENDENT_AMBULATORY_CARE_PROVIDER_SITE_OTHER): Payer: Medicare Other | Admitting: *Deleted

## 2011-07-06 DIAGNOSIS — Z7901 Long term (current) use of anticoagulants: Secondary | ICD-10-CM

## 2011-07-06 DIAGNOSIS — I4891 Unspecified atrial fibrillation: Secondary | ICD-10-CM

## 2011-07-06 DIAGNOSIS — I480 Paroxysmal atrial fibrillation: Secondary | ICD-10-CM

## 2011-08-17 ENCOUNTER — Ambulatory Visit (INDEPENDENT_AMBULATORY_CARE_PROVIDER_SITE_OTHER): Payer: Medicare Other | Admitting: *Deleted

## 2011-08-17 DIAGNOSIS — I4891 Unspecified atrial fibrillation: Secondary | ICD-10-CM

## 2011-08-17 DIAGNOSIS — Z7901 Long term (current) use of anticoagulants: Secondary | ICD-10-CM

## 2011-08-17 DIAGNOSIS — I480 Paroxysmal atrial fibrillation: Secondary | ICD-10-CM

## 2011-08-24 ENCOUNTER — Encounter: Payer: Medicare Other | Admitting: *Deleted

## 2011-08-31 ENCOUNTER — Ambulatory Visit (INDEPENDENT_AMBULATORY_CARE_PROVIDER_SITE_OTHER): Payer: Medicare Other | Admitting: *Deleted

## 2011-08-31 ENCOUNTER — Other Ambulatory Visit: Payer: Self-pay | Admitting: Internal Medicine

## 2011-08-31 ENCOUNTER — Encounter: Payer: Self-pay | Admitting: Internal Medicine

## 2011-08-31 DIAGNOSIS — I428 Other cardiomyopathies: Secondary | ICD-10-CM

## 2011-08-31 DIAGNOSIS — Z95810 Presence of automatic (implantable) cardiac defibrillator: Secondary | ICD-10-CM

## 2011-09-01 LAB — REMOTE ICD DEVICE
ATRIAL PACING ICD: 18 pct
BRDY-0002RA: 60 {beats}/min
BRDY-0003RA: 140 {beats}/min
CHARGE TIME: 11.5 s
DEVICE MODEL ICD: 366002
FVT: 0
HV IMPEDENCE: 42 Ohm
MODE SWITCH EPISODES: 8
PACEART VT: 0
TZAT-0002FASTVT: NEGATIVE
TZAT-0013FASTVT: 6
TZAT-0018FASTVT: NEGATIVE
TZST-0001FASTVT: 4
TZST-0001FASTVT: 5
TZST-0003FASTVT: 31 J
TZST-0003FASTVT: 31 J

## 2011-09-07 NOTE — Progress Notes (Signed)
Remote defib check  

## 2011-09-20 ENCOUNTER — Encounter: Payer: Self-pay | Admitting: *Deleted

## 2011-09-24 ENCOUNTER — Other Ambulatory Visit: Payer: Self-pay | Admitting: Internal Medicine

## 2011-09-25 ENCOUNTER — Other Ambulatory Visit: Payer: Self-pay

## 2011-09-25 MED ORDER — FUROSEMIDE 40 MG PO TABS: 40.0000 mg | ORAL_TABLET | ORAL | Status: DC

## 2011-09-28 ENCOUNTER — Encounter: Payer: Medicare Other | Admitting: *Deleted

## 2011-10-05 ENCOUNTER — Ambulatory Visit (INDEPENDENT_AMBULATORY_CARE_PROVIDER_SITE_OTHER): Payer: Medicare Other | Admitting: *Deleted

## 2011-10-05 DIAGNOSIS — I480 Paroxysmal atrial fibrillation: Secondary | ICD-10-CM

## 2011-10-05 DIAGNOSIS — I4891 Unspecified atrial fibrillation: Secondary | ICD-10-CM

## 2011-10-05 DIAGNOSIS — Z7901 Long term (current) use of anticoagulants: Secondary | ICD-10-CM

## 2011-10-05 LAB — POCT INR: INR: 1.7

## 2011-11-02 ENCOUNTER — Ambulatory Visit (INDEPENDENT_AMBULATORY_CARE_PROVIDER_SITE_OTHER): Payer: Medicare Other | Admitting: *Deleted

## 2011-11-02 DIAGNOSIS — I480 Paroxysmal atrial fibrillation: Secondary | ICD-10-CM

## 2011-11-02 DIAGNOSIS — Z7901 Long term (current) use of anticoagulants: Secondary | ICD-10-CM

## 2011-11-02 DIAGNOSIS — I4891 Unspecified atrial fibrillation: Secondary | ICD-10-CM

## 2011-11-02 LAB — POCT INR: INR: 2.4

## 2011-11-16 ENCOUNTER — Other Ambulatory Visit: Payer: Self-pay | Admitting: Cardiology

## 2011-11-30 ENCOUNTER — Encounter: Payer: Self-pay | Admitting: Internal Medicine

## 2011-11-30 ENCOUNTER — Ambulatory Visit (INDEPENDENT_AMBULATORY_CARE_PROVIDER_SITE_OTHER): Payer: Medicare Other | Admitting: *Deleted

## 2011-11-30 DIAGNOSIS — I4891 Unspecified atrial fibrillation: Secondary | ICD-10-CM

## 2011-11-30 DIAGNOSIS — I480 Paroxysmal atrial fibrillation: Secondary | ICD-10-CM

## 2011-11-30 DIAGNOSIS — Z7901 Long term (current) use of anticoagulants: Secondary | ICD-10-CM

## 2011-11-30 DIAGNOSIS — I428 Other cardiomyopathies: Secondary | ICD-10-CM

## 2011-11-30 LAB — POCT INR: INR: 3

## 2011-12-05 LAB — REMOTE ICD DEVICE
ATRIAL PACING ICD: 4 pct
CHARGE TIME: 12 s
FVT: 0
LV LEAD IMPEDENCE ICD: 595 Ohm
RV LEAD IMPEDENCE ICD: 518 Ohm
TZAT-0001FASTVT: 1
TZAT-0002FASTVT: NEGATIVE
TZAT-0018FASTVT: NEGATIVE
TZON-0003FASTVT: 333.3 ms
TZST-0001FASTVT: 5
TZST-0001FASTVT: 6
TZST-0003FASTVT: 26 J
TZST-0003FASTVT: 31 J
VENTRICULAR PACING ICD: 99 pct

## 2011-12-06 NOTE — Progress Notes (Signed)
Remote icd check  

## 2011-12-13 ENCOUNTER — Encounter: Payer: Self-pay | Admitting: *Deleted

## 2011-12-23 IMAGING — CR DG CHEST 2V
2 series · 2 of 2 positions shown · non-contrast
Comparison: Chest 11/03/2006

CLINICAL DATA: Atrial fibrillation.

CHEST - 2 VIEW

[view not recorded (1 of 2)]
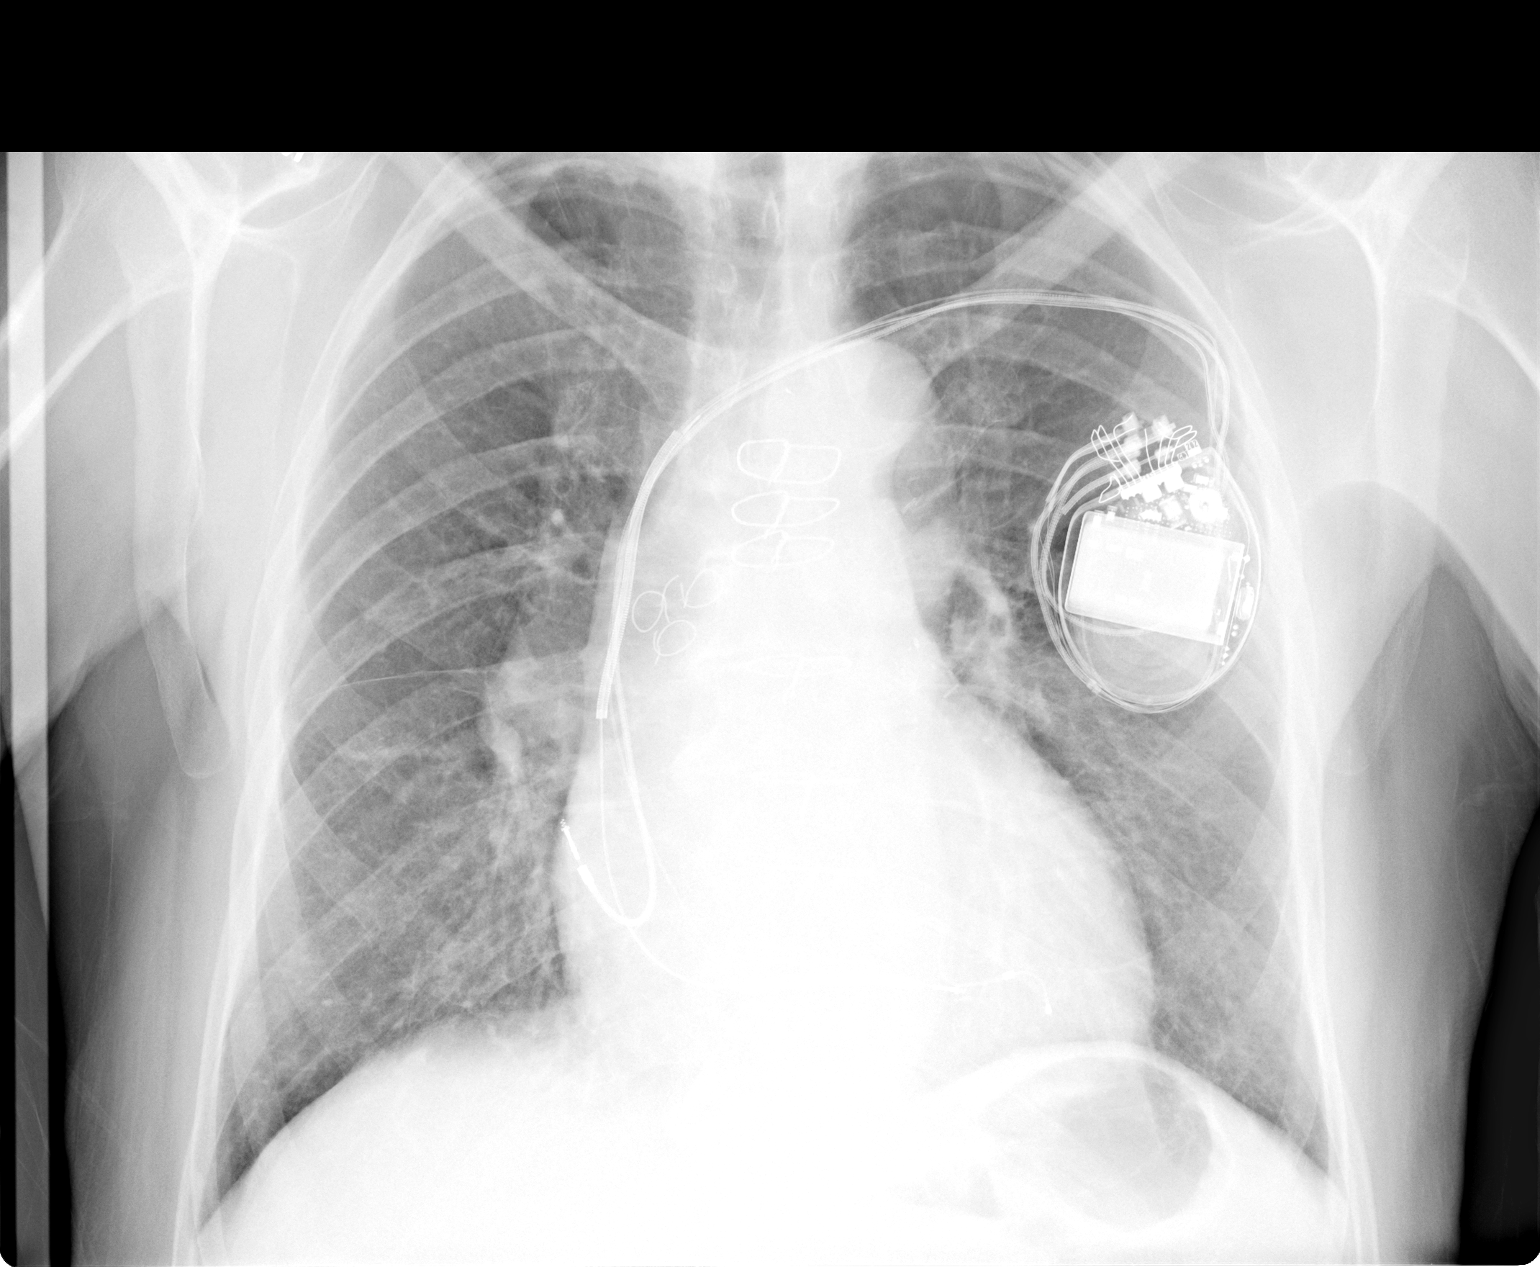

[view not recorded (2 of 2)]
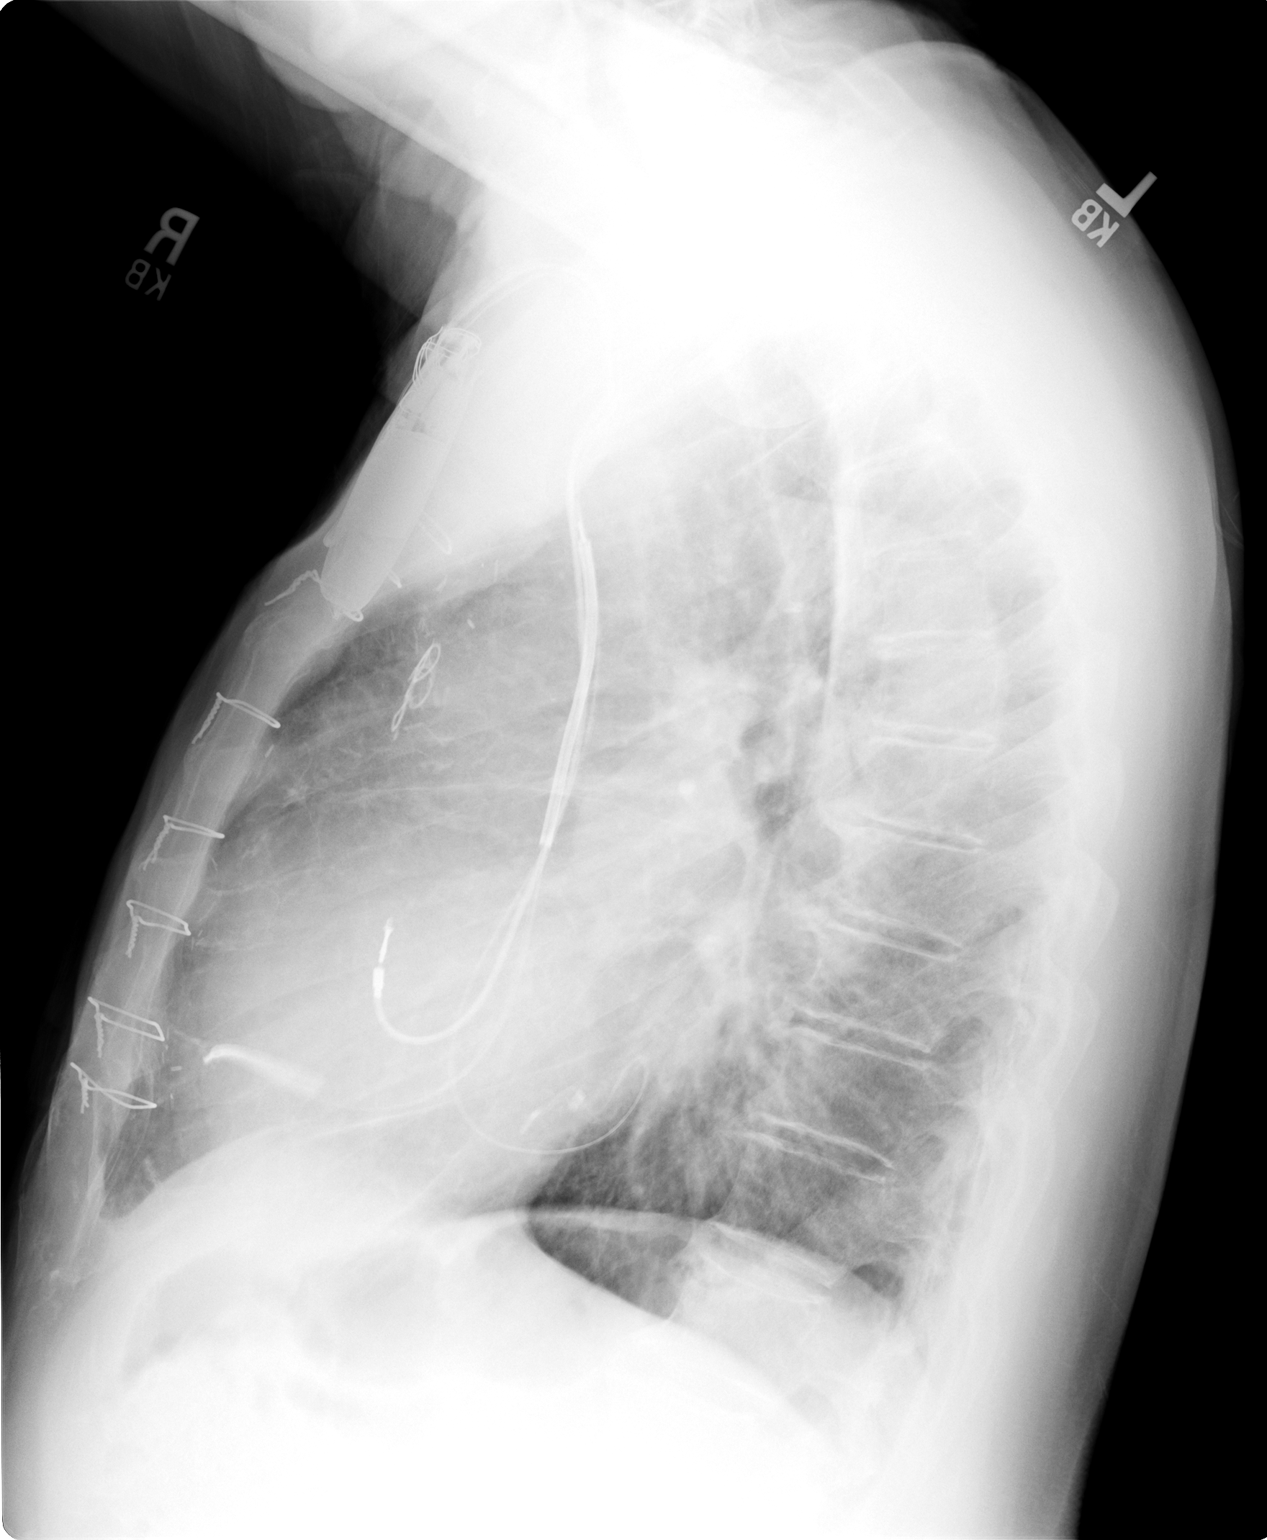

[2 of 2 positions shown; findings below may reference images not displayed]

FINDINGS: AICD is unchanged.  There is mild cardiomegaly with
pulmonary vascular congestion.  No frank edema or focal airspace
disease.  No effusion.
IMPRESSION: No acute disease.  Mild cardiomegaly noted.

## 2011-12-28 ENCOUNTER — Ambulatory Visit (INDEPENDENT_AMBULATORY_CARE_PROVIDER_SITE_OTHER): Payer: Medicare Other | Admitting: *Deleted

## 2011-12-28 DIAGNOSIS — Z7901 Long term (current) use of anticoagulants: Secondary | ICD-10-CM

## 2011-12-28 DIAGNOSIS — I480 Paroxysmal atrial fibrillation: Secondary | ICD-10-CM

## 2011-12-28 DIAGNOSIS — I4891 Unspecified atrial fibrillation: Secondary | ICD-10-CM

## 2012-01-23 ENCOUNTER — Other Ambulatory Visit: Payer: Self-pay | Admitting: *Deleted

## 2012-01-23 MED ORDER — FUROSEMIDE 40 MG PO TABS: 40.0000 mg | ORAL_TABLET | ORAL | Status: DC

## 2012-01-23 NOTE — Telephone Encounter (Signed)
Fax Received. Refill Completed. [name] (R.M.A)   

## 2012-02-01 ENCOUNTER — Ambulatory Visit (INDEPENDENT_AMBULATORY_CARE_PROVIDER_SITE_OTHER): Payer: Medicare Other | Admitting: *Deleted

## 2012-02-01 DIAGNOSIS — Z7901 Long term (current) use of anticoagulants: Secondary | ICD-10-CM

## 2012-02-01 DIAGNOSIS — I4891 Unspecified atrial fibrillation: Secondary | ICD-10-CM

## 2012-02-01 DIAGNOSIS — I480 Paroxysmal atrial fibrillation: Secondary | ICD-10-CM

## 2012-02-20 ENCOUNTER — Encounter: Payer: Medicare Other | Admitting: Internal Medicine

## 2012-02-22 ENCOUNTER — Ambulatory Visit: Payer: Medicare Other | Admitting: Cardiology

## 2012-02-26 ENCOUNTER — Encounter: Payer: Self-pay | Admitting: Internal Medicine

## 2012-02-26 ENCOUNTER — Ambulatory Visit (INDEPENDENT_AMBULATORY_CARE_PROVIDER_SITE_OTHER): Payer: Medicare Other | Admitting: Internal Medicine

## 2012-02-26 ENCOUNTER — Ambulatory Visit (INDEPENDENT_AMBULATORY_CARE_PROVIDER_SITE_OTHER): Payer: Medicare Other | Admitting: *Deleted

## 2012-02-26 VITALS — BP 121/74 | HR 70 | Resp 18 | Ht 71.0 in | Wt 180.0 lb

## 2012-02-26 DIAGNOSIS — I428 Other cardiomyopathies: Secondary | ICD-10-CM

## 2012-02-26 DIAGNOSIS — I480 Paroxysmal atrial fibrillation: Secondary | ICD-10-CM

## 2012-02-26 DIAGNOSIS — I4891 Unspecified atrial fibrillation: Secondary | ICD-10-CM

## 2012-02-26 DIAGNOSIS — Z7901 Long term (current) use of anticoagulants: Secondary | ICD-10-CM

## 2012-02-26 DIAGNOSIS — Z95810 Presence of automatic (implantable) cardiac defibrillator: Secondary | ICD-10-CM

## 2012-02-26 LAB — ICD DEVICE OBSERVATION
AL AMPLITUDE: 0.9 mv
ATRIAL PACING ICD: 6 pct
BATTERY VOLTAGE: 2.54 V
HV IMPEDENCE: 42 Ohm
RV LEAD IMPEDENCE ICD: 543 Ohm
TZAT-0001FASTVT: 2
TZAT-0018FASTVT: NEGATIVE
TZON-0003FASTVT: 333.3 ms
TZST-0001FASTVT: 3
TZST-0001FASTVT: 6
TZST-0001FASTVT: 7
TZST-0003FASTVT: 26 J
TZST-0003FASTVT: 31 J
TZST-0003FASTVT: 31 J
TZST-0003FASTVT: 31 J

## 2012-02-26 LAB — POCT INR: INR: 2.2

## 2012-02-26 NOTE — Assessment & Plan Note (Signed)
The patient has now developed persistent atrial fibrillation. He is asymptomatic. He has a very slow ventricular response and is pacing 100% of the time in the ventricle. He will continue his current medical therapy and maintain rate control.

## 2012-02-26 NOTE — Progress Notes (Signed)
HPI [name] returns today for followup. He is a very pleasant 78 year old man with chronic systolic heart failure and an ischemic cardiomyopathy, left bundle branch block, status post biventricular ICD implantation. In the interim, the patient has been stable. He denies chest pain, shortness of breath, or peripheral edema. He notes an episode of cellulitis which occurred after he was stung by over 100 chiggars. This required hospitalization for 3 days. He is improved. The patient complains of easy bruisability on his anticoagulation regimen of warfarin and aspirin. He denies any ICD shock. He does not feel palpitations. No Known Allergies   Current Outpatient Prescriptions  Medication Sig Dispense Refill  . aspirin 81 MG tablet Take 81 mg by mouth daily.        . carvedilol (COREG) 6.25 MG tablet Take 1 tablet (6.25 mg total) by mouth 2 (two) times daily.  60 tablet  11  . furosemide (LASIX) 40 MG tablet Take 1 tablet (40 mg total) by mouth every other day.  90 tablet  3  . levothyroxine (SYNTHROID, LEVOTHROID) 112 MCG tablet Take 112 mcg by mouth daily.        . Multiple Vitamin (MULTIVITAMIN) tablet Take 1 tablet by mouth daily. Take 1/2 tablet a day.       . pravastatin (PRAVACHOL) 40 MG tablet TAKE ONE TABLET BY MOUTH EVERY DAY  30 tablet  6  . warfarin (COUMADIN) 2 MG tablet TAKE AS DIRECTED BY  ANTICOAGULATION  CLINIC  60 tablet  3  . DISCONTD: warfarin (COUMADIN) 2 MG tablet Take 3 mg by mouth daily. Takes one and one-half tablet daily to = 3mg .          Past Medical History  Diagnosis Date  . Hypertension   . Hypothyroid   . Chronic anticoagulation     continued for both PAF & possible apical mural thrombus.  . Arteriosclerotic cardiovascular disease (ASCVD) 2003    status post CABG surgery-2003 following AMI; ischemic cardiomyopathy with an EF of 25%; s/p AICD implantation-2007; h/o LBBB; possible apical thrombus   . Paroxysmal atrial fibrillation 2003  . AICD (automatic  cardioverter/defibrillator) present   . Hyperlipidemia   . Mitral regurgitation     Mild by echo in 08/2007 but moderate to severe in 09/2008  . Coronary artery disease   . CHF (congestive heart failure)   . Other primary cardiomyopathies     ROS:   All systems reviewed and negative except as noted in the HPI.   Past Surgical History  Procedure Date  . Coronary artery bypass graft 2003  . Cardiac defibrillator placement 2007  . Insert / replace / remove pacemaker      No family history on file.   History   Social History  . Marital Status: Married    Spouse Name: N/A    Number of Children: N/A  . Years of Education: N/A   Occupational History  . Not on file.   Social History Main Topics  . Smoking status: Never Smoker   . Smokeless tobacco: Not on file  . Alcohol Use: No  . Drug Use: No  . Sexually Active: Not on file   Other Topics Concern  . Not on file   Social History Narrative  . No narrative on file     BP 121/74  Pulse 70  Resp 18  Ht 5\' 11"  (1.803 m)  Wt 180 lb (81.647 kg)  BMI 25.10 kg/m2  Physical Exam:  Well appearing elderly man, NAD  HEENT: Unremarkable Neck:  7 cm JVD, no thyromegally Lungs:  Clear with no wheezes, rales, or rhonchi. HEART:  Regular rate rhythm, no murmurs, no rubs, no clicks Abd:  soft, positive bowel sounds, no organomegally, no rebound, no guarding Ext:  2 plus pulses, no edema, no cyanosis, no clubbing Skin:  No rashes no nodules Neuro:  CN II through XII intact, motor grossly intact  DEVICE  Normal device function.  See PaceArt for details.   Assess/Plan:

## 2012-02-26 NOTE — Assessment & Plan Note (Signed)
His device is working normally. He is approximately 6 months from elective replacement. We'll plan to recheck in several months.

## 2012-03-04 ENCOUNTER — Other Ambulatory Visit: Payer: Self-pay | Admitting: Internal Medicine

## 2012-03-04 MED ORDER — FUROSEMIDE 40 MG PO TABS: 40.0000 mg | ORAL_TABLET | ORAL | Status: DC

## 2012-03-12 ENCOUNTER — Other Ambulatory Visit: Payer: Self-pay | Admitting: Cardiology

## 2012-03-12 MED ORDER — FUROSEMIDE 40 MG PO TABS: 40.0000 mg | ORAL_TABLET | ORAL | Status: DC

## 2012-03-26 IMAGING — CR DG CHEST 2V
2 series · 2 of 2 positions shown · non-contrast
Comparison: 09/10/2009

CLINICAL DATA: Coronary artery disease.  Acute bronchitis.

CHEST - 2 VIEW

[view not recorded (1 of 2)]
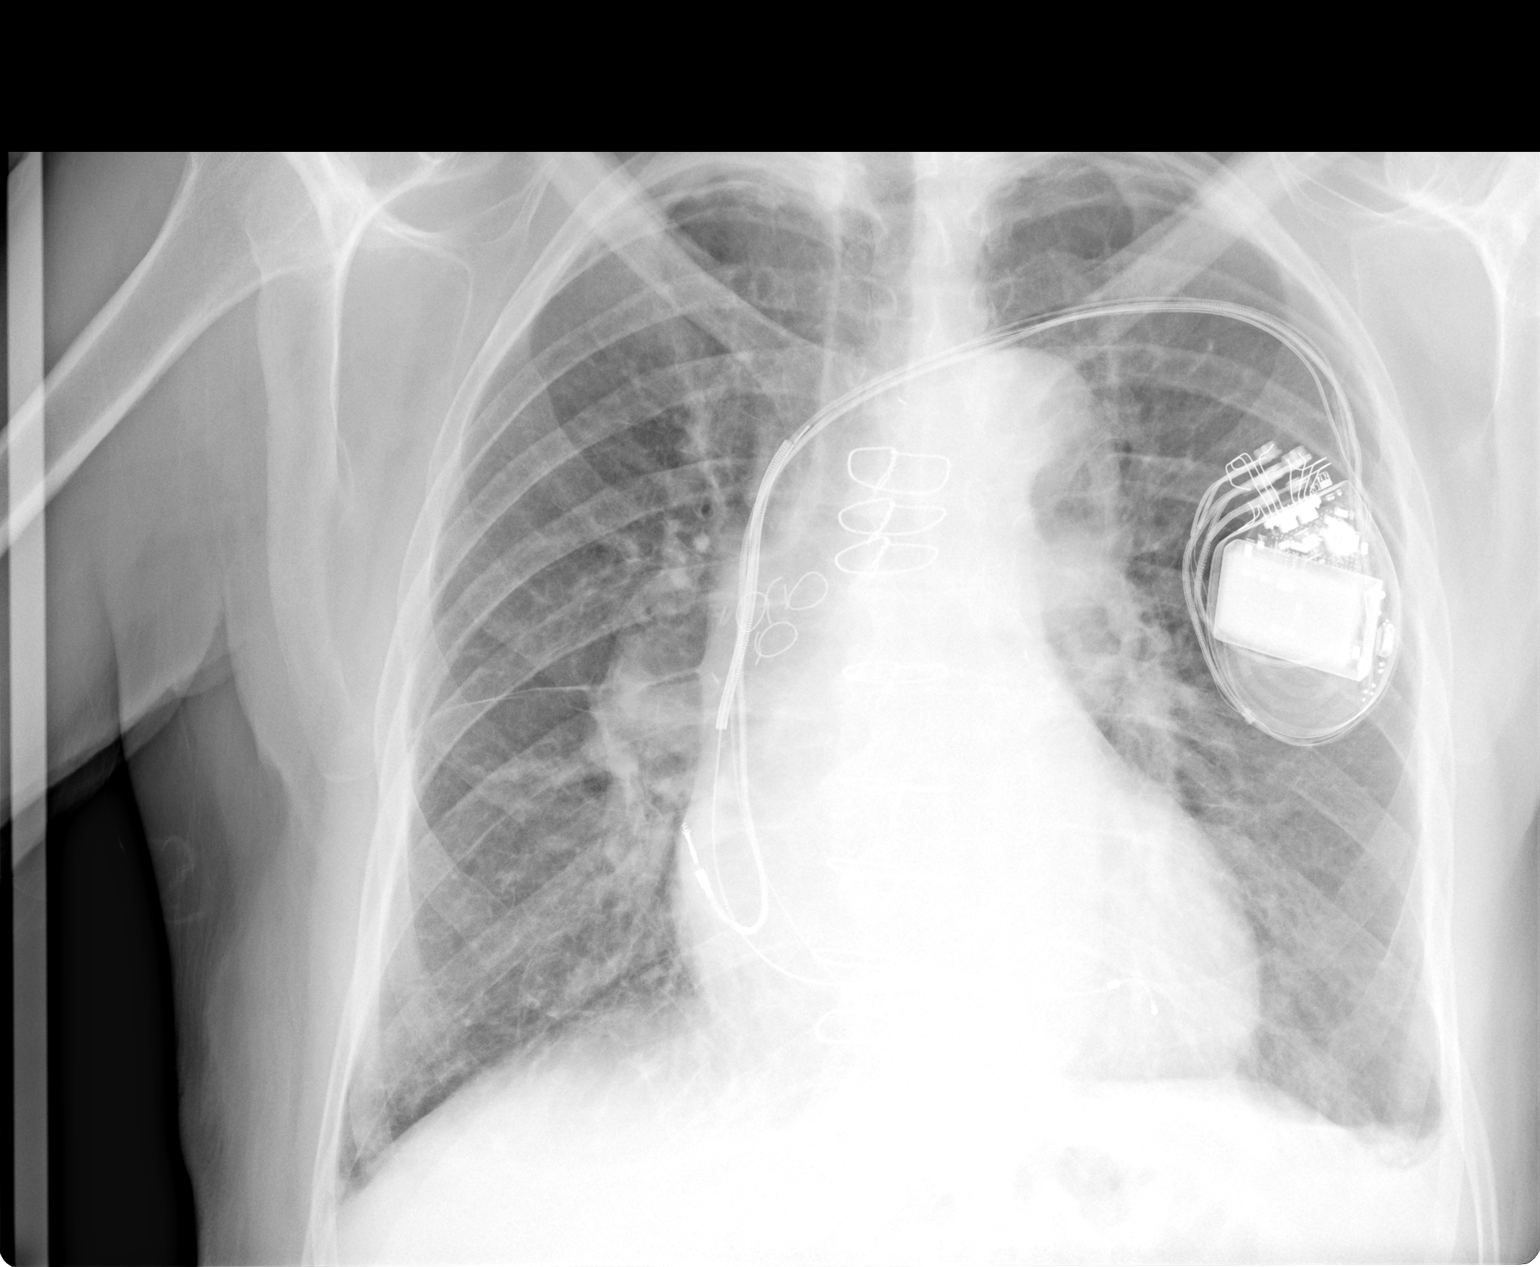

[view not recorded (2 of 2)]
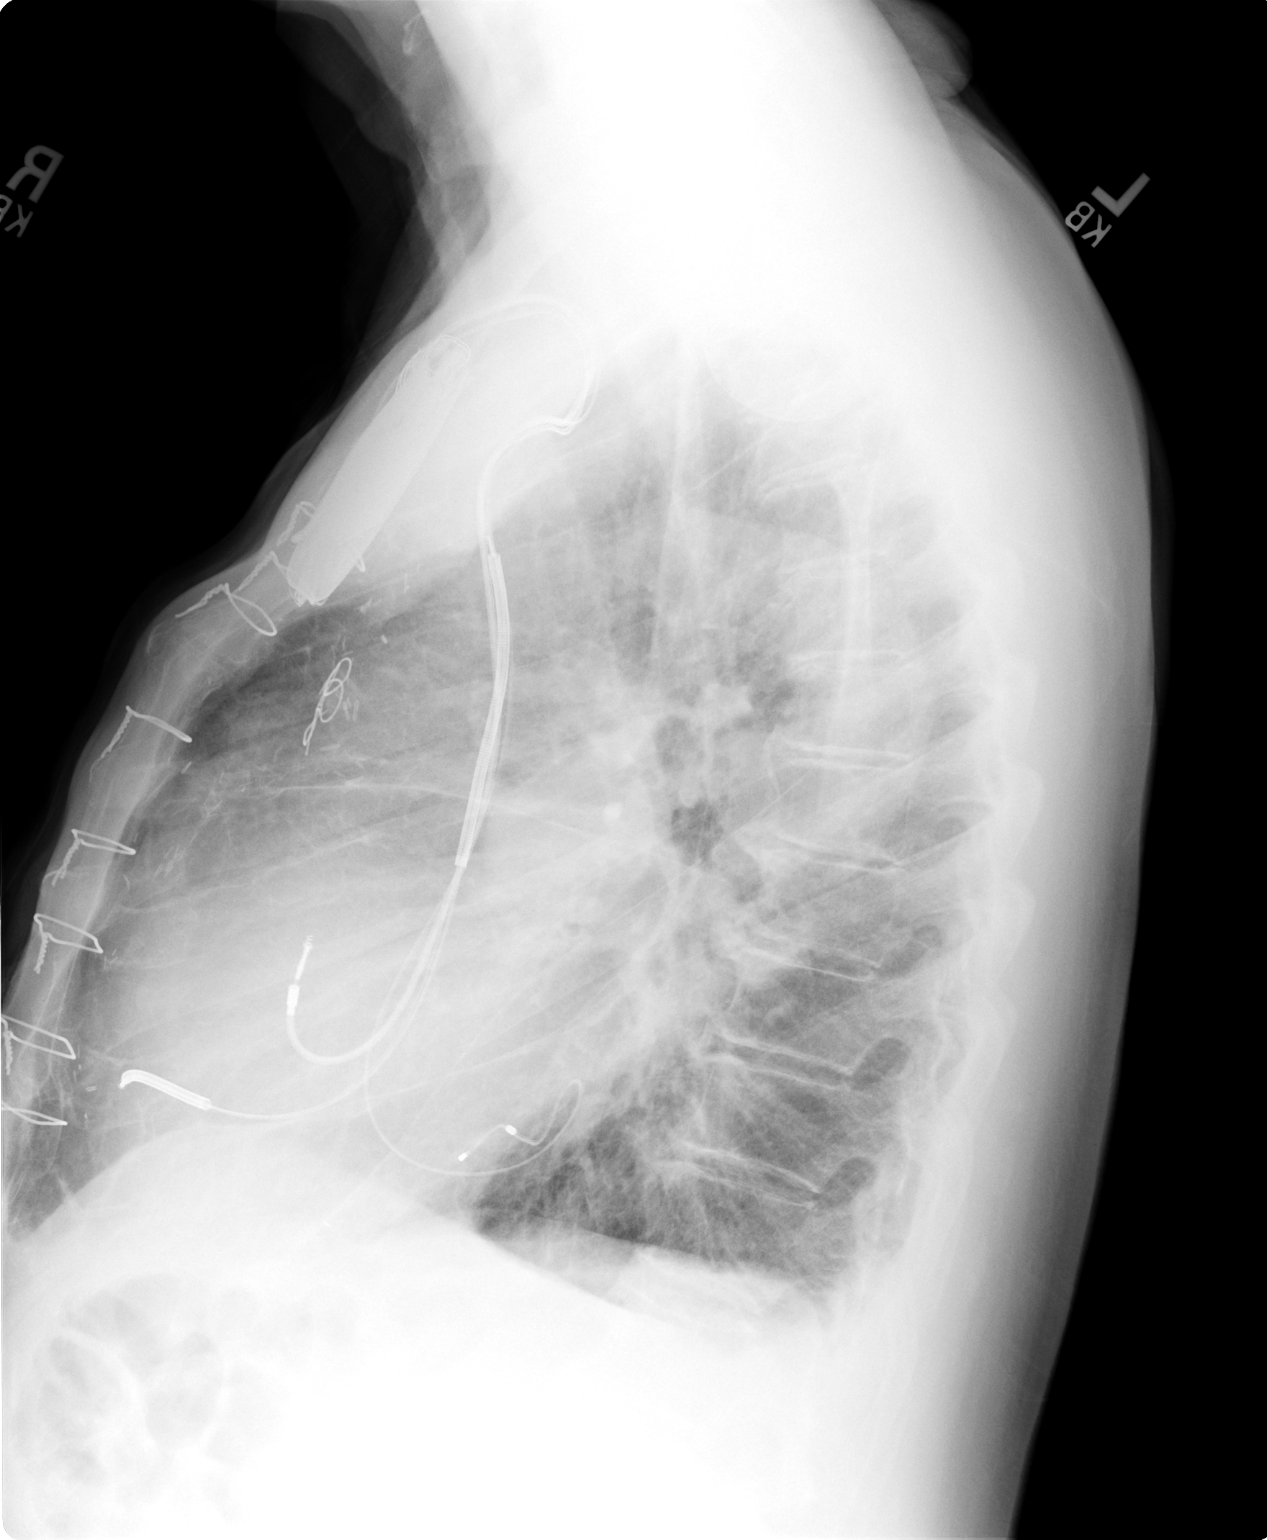

[2 of 2 positions shown; findings below may reference images not displayed]

FINDINGS: Mid thoracic spondylosis. Pacer / AICD device.  No lead
discontinuity. Prior median sternotomy. Midline trachea.  Mild
cardiomegaly.  Tortuous thoracic aorta.  Small left pleural
effusion is new and blunts the left costophrenic angle.

Biapical pleural thickening. No pneumothorax.  Diffuse interstitial
thickening is similar and likely relates to peribronchial
thickening/chronic bronchitis.
IMPRESSION: 1.  New blunting of the left costophrenic angle likely relates to
trace left pleural fluid.
2.  Otherwise similar cardiomegaly and chronic interstitial
thickening without acute superimposed process.

## 2012-03-30 ENCOUNTER — Other Ambulatory Visit: Payer: Self-pay | Admitting: Internal Medicine

## 2012-04-03 ENCOUNTER — Ambulatory Visit (INDEPENDENT_AMBULATORY_CARE_PROVIDER_SITE_OTHER): Payer: Medicare Other | Admitting: *Deleted

## 2012-04-03 DIAGNOSIS — Z7901 Long term (current) use of anticoagulants: Secondary | ICD-10-CM

## 2012-04-03 DIAGNOSIS — I4891 Unspecified atrial fibrillation: Secondary | ICD-10-CM

## 2012-04-03 DIAGNOSIS — I480 Paroxysmal atrial fibrillation: Secondary | ICD-10-CM

## 2012-04-08 ENCOUNTER — Telehealth: Payer: Self-pay | Admitting: *Deleted

## 2012-04-08 NOTE — Telephone Encounter (Signed)
Received [employer] transmission.  Patient's ICD at [facility].  Appt scheduled with Dr Ladona Ridgel next Friday at 10AM.  Pt aware and agrees.

## 2012-04-19 ENCOUNTER — Ambulatory Visit (INDEPENDENT_AMBULATORY_CARE_PROVIDER_SITE_OTHER): Payer: Medicare Other | Admitting: Internal Medicine

## 2012-04-19 ENCOUNTER — Encounter: Payer: Self-pay | Admitting: Internal Medicine

## 2012-04-19 VITALS — BP 122/74 | HR 60 | Ht 71.0 in | Wt 178.0 lb

## 2012-04-19 DIAGNOSIS — I5022 Chronic systolic (congestive) heart failure: Secondary | ICD-10-CM

## 2012-04-19 DIAGNOSIS — Z95810 Presence of automatic (implantable) cardiac defibrillator: Secondary | ICD-10-CM

## 2012-04-19 DIAGNOSIS — I2589 Other forms of chronic ischemic heart disease: Secondary | ICD-10-CM

## 2012-04-19 LAB — ICD DEVICE OBSERVATION
DEVICE MODEL ICD: 366002
TZAT-0001FASTVT: 2
TZAT-0002FASTVT: NEGATIVE
TZAT-0018FASTVT: NEGATIVE
TZON-0003FASTVT: 333.3 ms
TZST-0001FASTVT: 5
TZST-0001FASTVT: 6
TZST-0001FASTVT: 7
TZST-0003FASTVT: 31 J
TZST-0003FASTVT: 31 J

## 2012-04-19 NOTE — Progress Notes (Signed)
HPI [name] returns today for followup. He is an 78 year old man with complete heart block, and ischemic cardiomyopathy, chronic systolic heart failure, status post biventricular ICD implantation. In the interim, he has been stable. He has reached elective replacement, 6-1/2 years after his initial device implantation. His heart failure remains class II. No Known Allergies   Current Outpatient Prescriptions  Medication Sig Dispense Refill  . aspirin 81 MG tablet Take 81 mg by mouth daily.        . carvedilol (COREG) 6.25 MG tablet Take 1 tablet (6.25 mg total) by mouth 2 (two) times daily.  60 tablet  11  . furosemide (LASIX) 40 MG tablet Take 1 tablet (40 mg total) by mouth every other day.  30 tablet  3  . levothyroxine (SYNTHROID, LEVOTHROID) 112 MCG tablet Take 112 mcg by mouth daily.        . Multiple Vitamin (MULTIVITAMIN) tablet Take 1 tablet by mouth daily. Take 1/2 tablet a day.       . pravastatin (PRAVACHOL) 40 MG tablet TAKE ONE TABLET BY MOUTH EVERY DAY  30 tablet  6  . warfarin (COUMADIN) 2 MG tablet TAKE AS DIRECTED BY  ANTICOGULATION  CLINIC  60 tablet  2     Past Medical History  Diagnosis Date  . Hypertension   . Hypothyroid   . Chronic anticoagulation     continued for both PAF & possible apical mural thrombus.  . Arteriosclerotic cardiovascular disease (ASCVD) 2003    status post CABG surgery-2003 following AMI; ischemic cardiomyopathy with an EF of 25%; s/p AICD implantation-2007; h/o LBBB; possible apical thrombus   . Paroxysmal atrial fibrillation 2003  . AICD (automatic cardioverter/defibrillator) present   . Hyperlipidemia   . Mitral regurgitation     Mild by echo in 08/2007 but moderate to severe in 09/2008  . Coronary artery disease   . CHF (congestive heart failure)   . Other primary cardiomyopathies     ROS:   All systems reviewed and negative except as noted in the HPI.   Past Surgical History  Procedure Date  . Coronary artery bypass graft  2003  . Cardiac defibrillator placement 2007  . Insert / replace / remove pacemaker      No family history on file.   History   Social History  . Marital Status: Married    Spouse Name: N/A    Number of Children: N/A  . Years of Education: N/A   Occupational History  . Not on file.   Social History Main Topics  . Smoking status: Never Smoker   . Smokeless tobacco: Not on file  . Alcohol Use: No  . Drug Use: No  . Sexually Active: Not on file   Other Topics Concern  . Not on file   Social History Narrative  . No narrative on file     BP 122/74  Pulse 60  Ht 5\' 11"  (1.803 m)  Wt 178 lb (80.74 kg)  BMI 24.83 kg/m2  SpO2 99%  Physical Exam:  Well appearing NAD HEENT: Unremarkable Neck:  No JVD, no thyromegally Lungs:  Clear with no wheezes, rales, or rhonchi. HEART:  Regular rate rhythm, no murmurs, no rubs, no clicks Abd:  soft, positive bowel sounds, no organomegally, no rebound, no guarding Ext:  2 plus pulses, no edema, no cyanosis, no clubbing Skin:  No rashes no nodules Neuro:  CN II through XII intact, motor grossly intact  DEVICE  Normal device function.  See PaceArt  for details. Device at elective replacement  Assess/Plan:

## 2012-04-19 NOTE — Assessment & Plan Note (Signed)
His device has reached elective replacement. With his advanced age, I recommended removing his biventricular ICD, and placing a biventricular pacemaker. We'll schedule this in the next several weeks.

## 2012-04-22 ENCOUNTER — Other Ambulatory Visit: Payer: Self-pay | Admitting: *Deleted

## 2012-04-22 ENCOUNTER — Encounter (HOSPITAL_COMMUNITY): Payer: Self-pay | Admitting: Respiratory Therapy

## 2012-04-22 ENCOUNTER — Encounter: Payer: Self-pay | Admitting: *Deleted

## 2012-04-22 DIAGNOSIS — Z419 Encounter for procedure for purposes other than remedying health state, unspecified: Secondary | ICD-10-CM

## 2012-04-22 DIAGNOSIS — I509 Heart failure, unspecified: Secondary | ICD-10-CM

## 2012-04-22 DIAGNOSIS — Z01812 Encounter for preprocedural laboratory examination: Secondary | ICD-10-CM

## 2012-04-23 ENCOUNTER — Encounter: Payer: Self-pay | Admitting: Internal Medicine

## 2012-04-24 ENCOUNTER — Other Ambulatory Visit: Payer: Self-pay | Admitting: Internal Medicine

## 2012-04-24 LAB — CBC WITH DIFFERENTIAL/PLATELET
Basophils Absolute: 0 10*3/uL (ref 0.0–0.1)
Basophils Relative: 1 % (ref 0–1)
Hemoglobin: 13.8 g/dL (ref 13.0–17.0)
Lymphocytes Relative: 16 % (ref 12–46)
MCHC: 34.4 g/dL (ref 30.0–36.0)
Monocytes Relative: 9 % (ref 3–12)
Neutro Abs: 5.2 10*3/uL (ref 1.7–7.7)
Neutrophils Relative %: 70 % (ref 43–77)
RDW: 13.2 % (ref 11.5–15.5)
WBC: 7.4 10*3/uL (ref 4.0–10.5)

## 2012-04-24 LAB — BASIC METABOLIC PANEL
Chloride: 105 mEq/L (ref 96–112)
Potassium: 4.5 mEq/L (ref 3.5–5.3)
Sodium: 144 mEq/L (ref 135–145)

## 2012-04-30 MED ORDER — SODIUM CHLORIDE 0.9 % IR SOLN
80.0000 mg | Status: DC
  Filled 2012-04-30: qty 2

## 2012-04-30 MED ORDER — SODIUM CHLORIDE 0.9 % IJ SOLN: 3.0000 mL | INTRAMUSCULAR | Status: DC | PRN

## 2012-04-30 MED ORDER — SODIUM CHLORIDE 0.9 % IV SOLN: 250.0000 mL | INTRAVENOUS | Status: DC

## 2012-04-30 MED ORDER — SODIUM CHLORIDE 0.45 % IV SOLN
INTRAVENOUS | Status: DC
  Administered 2012-05-01: 50 mL/h via INTRAVENOUS

## 2012-04-30 MED ORDER — SODIUM CHLORIDE 0.9 % IJ SOLN: 3.0000 mL | Freq: Two times a day (BID) | INTRAMUSCULAR | Status: DC

## 2012-04-30 MED ORDER — CEFAZOLIN SODIUM-DEXTROSE 2-3 GM-% IV SOLR
2.0000 g | INTRAVENOUS | Status: DC
  Filled 2012-04-30 (×2): qty 50

## 2012-05-01 ENCOUNTER — Encounter (HOSPITAL_COMMUNITY)
Admission: RE | Disposition: A | Discharge status: Home / Self Care | Payer: Self-pay | Source: Ambulatory Visit | Attending: Internal Medicine

## 2012-05-01 ENCOUNTER — Ambulatory Visit (HOSPITAL_COMMUNITY): Payer: Medicare Other

## 2012-05-01 ENCOUNTER — Ambulatory Visit (HOSPITAL_COMMUNITY)
Admission: RE | Admit: 2012-05-01 | Discharge: 2012-05-01 | Disposition: A | Discharge status: Home / Self Care | Payer: Medicare Other | Source: Ambulatory Visit | Attending: Internal Medicine | Admitting: Internal Medicine

## 2012-05-01 DIAGNOSIS — I442 Atrioventricular block, complete: Secondary | ICD-10-CM

## 2012-05-01 DIAGNOSIS — Z419 Encounter for procedure for purposes other than remedying health state, unspecified: Secondary | ICD-10-CM

## 2012-05-01 DIAGNOSIS — I5022 Chronic systolic (congestive) heart failure: Secondary | ICD-10-CM | POA: Insufficient documentation

## 2012-05-01 DIAGNOSIS — I2589 Other forms of chronic ischemic heart disease: Secondary | ICD-10-CM | POA: Insufficient documentation

## 2012-05-01 DIAGNOSIS — Z4502 Encounter for adjustment and management of automatic implantable cardiac defibrillator: Secondary | ICD-10-CM | POA: Insufficient documentation

## 2012-05-01 DIAGNOSIS — I509 Heart failure, unspecified: Secondary | ICD-10-CM

## 2012-05-01 DIAGNOSIS — I4891 Unspecified atrial fibrillation: Secondary | ICD-10-CM | POA: Insufficient documentation

## 2012-05-01 HISTORY — PX: BI-VENTRICULAR PACEMAKER INSERTION: SHX5462

## 2012-05-01 LAB — PROTIME-INR
INR: 2.16 — ABNORMAL HIGH (ref 0.00–1.49)
Prothrombin Time: 24.5 seconds — ABNORMAL HIGH (ref 11.6–15.2)

## 2012-05-01 LAB — SURGICAL PCR SCREEN: Staphylococcus aureus: NEGATIVE

## 2012-05-01 SURGERY — BI-VENTRICULAR PACEMAKER INSERTION (CRT-P)
Anesthesia: LOCAL

## 2012-05-01 MED ORDER — ACETAMINOPHEN 325 MG PO TABS
325.0000 mg | ORAL_TABLET | ORAL | Status: DC | PRN
  Filled 2012-05-01: qty 2

## 2012-05-01 MED ORDER — WARFARIN SODIUM 2 MG PO TABS: 2.0000 mg | ORAL_TABLET | Freq: Every day | ORAL | Status: DC

## 2012-05-01 MED ORDER — WARFARIN SODIUM 3 MG PO TABS: 3.0000 mg | ORAL_TABLET | ORAL | Status: DC

## 2012-05-01 MED ORDER — FUROSEMIDE 40 MG PO TABS
40.0000 mg | ORAL_TABLET | Freq: Every day | ORAL | Status: DC
Start: 2012-05-01 — End: 2012-05-01
  Filled 2012-05-01: qty 1

## 2012-05-01 MED ORDER — WARFARIN SODIUM 2 MG PO TABS
2.0000 mg | ORAL_TABLET | ORAL | Status: DC
  Filled 2012-05-01: qty 1

## 2012-05-01 MED ORDER — FENTANYL CITRATE 0.05 MG/ML IJ SOLN
INTRAMUSCULAR | Status: AC
  Filled 2012-05-01: qty 2

## 2012-05-01 MED ORDER — MUPIROCIN 2 % EX OINT
TOPICAL_OINTMENT | CUTANEOUS | Status: AC
  Filled 2012-05-01: qty 22

## 2012-05-01 MED ORDER — LEVOTHYROXINE SODIUM 112 MCG PO TABS
112.0000 ug | ORAL_TABLET | Freq: Every day | ORAL | Status: DC
  Filled 2012-05-01: qty 1

## 2012-05-01 MED ORDER — LIDOCAINE HCL (PF) 1 % IJ SOLN
INTRAMUSCULAR | Status: AC
  Filled 2012-05-01: qty 60

## 2012-05-01 MED ORDER — MIDAZOLAM HCL 5 MG/5ML IJ SOLN
INTRAMUSCULAR | Status: AC
  Filled 2012-05-01: qty 5

## 2012-05-01 MED ORDER — WARFARIN - PHYSICIAN DOSING INPATIENT: Freq: Every day | Status: DC

## 2012-05-01 MED ORDER — CARVEDILOL 6.25 MG PO TABS
6.2500 mg | ORAL_TABLET | Freq: Every day | ORAL | Status: DC
  Filled 2012-05-01: qty 1

## 2012-05-01 MED ORDER — MUPIROCIN 2 % EX OINT
TOPICAL_OINTMENT | Freq: Two times a day (BID) | CUTANEOUS | Status: DC
  Administered 2012-05-01: 1 via NASAL
  Filled 2012-05-01: qty 22

## 2012-05-01 MED ORDER — ONDANSETRON HCL 4 MG/2ML IJ SOLN: 4.0000 mg | Freq: Four times a day (QID) | INTRAMUSCULAR | Status: DC | PRN

## 2012-05-01 NOTE — H&P (View-Only) (Signed)
HPI [name] returns today for followup. He is an 78-year-old man with complete heart block, and ischemic cardiomyopathy, chronic systolic heart failure, status post biventricular ICD implantation. In the interim, he has been stable. He has reached elective replacement, 6-1/2 years after his initial device implantation. His heart failure remains class II. No Known Allergies   Current Outpatient Prescriptions  Medication Sig Dispense Refill  . aspirin 81 MG tablet Take 81 mg by mouth daily.        . carvedilol (COREG) 6.25 MG tablet Take 1 tablet (6.25 mg total) by mouth 2 (two) times daily.  60 tablet  11  . furosemide (LASIX) 40 MG tablet Take 1 tablet (40 mg total) by mouth every other day.  30 tablet  3  . levothyroxine (SYNTHROID, LEVOTHROID) 112 MCG tablet Take 112 mcg by mouth daily.        . Multiple Vitamin (MULTIVITAMIN) tablet Take 1 tablet by mouth daily. Take 1/2 tablet a day.       . pravastatin (PRAVACHOL) 40 MG tablet TAKE ONE TABLET BY MOUTH EVERY DAY  30 tablet  6  . warfarin (COUMADIN) 2 MG tablet TAKE AS DIRECTED BY  ANTICOGULATION  CLINIC  60 tablet  2     Past Medical History  Diagnosis Date  . Hypertension   . Hypothyroid   . Chronic anticoagulation     continued for both PAF & possible apical mural thrombus.  . Arteriosclerotic cardiovascular disease (ASCVD) 2003    status post CABG surgery-2003 following AMI; ischemic cardiomyopathy with an EF of 25%; s/p AICD implantation-2007; h/o LBBB; possible apical thrombus   . Paroxysmal atrial fibrillation 2003  . AICD (automatic cardioverter/defibrillator) present   . Hyperlipidemia   . Mitral regurgitation     Mild by echo in 08/2007 but moderate to severe in 09/2008  . Coronary artery disease   . CHF (congestive heart failure)   . Other primary cardiomyopathies     ROS:   All systems reviewed and negative except as noted in the HPI.   Past Surgical History  Procedure Date  . Coronary artery bypass graft  2003  . Cardiac defibrillator placement 2007  . Insert / replace / remove pacemaker      No family history on file.   History   Social History  . Marital Status: Married    Spouse Name: N/A    Number of Children: N/A  . Years of Education: N/A   Occupational History  . Not on file.   Social History Main Topics  . Smoking status: Never Smoker   . Smokeless tobacco: Not on file  . Alcohol Use: No  . Drug Use: No  . Sexually Active: Not on file   Other Topics Concern  . Not on file   Social History Narrative  . No narrative on file     BP 122/74  Pulse 60  Ht 5' 11" (1.803 m)  Wt 178 lb (80.74 kg)  BMI 24.83 kg/m2  SpO2 99%  Physical Exam:  Well appearing NAD HEENT: Unremarkable Neck:  No JVD, no thyromegally Lungs:  Clear with no wheezes, rales, or rhonchi. HEART:  Regular rate rhythm, no murmurs, no rubs, no clicks Abd:  soft, positive bowel sounds, no organomegally, no rebound, no guarding Ext:  2 plus pulses, no edema, no cyanosis, no clubbing Skin:  No rashes no nodules Neuro:  CN II through XII intact, motor grossly intact  DEVICE  Normal device function.  See PaceArt   for details. Device at elective replacement  Assess/Plan:  

## 2012-05-01 NOTE — Interval H&P Note (Signed)
History and Physical Interval Note:  05/01/2012 3:20 PM  [name]  has presented today for surgery, with the diagnosis of hf  The various methods of treatment have been discussed with the patient and family. After consideration of risks, benefits and other options for treatment, the patient has consented to  Procedure(s) (LRB): BI-VENTRICULAR PACEMAKER INSERTION (CRT-P) (N/A) as a surgical intervention .  The patient's history has been reviewed, patient examined, no change in status, stable for surgery.  I have reviewed the patient's chart and labs.  Questions were answered to the patient's satisfaction.     Lewayne Bunting

## 2012-05-01 NOTE — Op Note (Signed)
BiV ICD removal and insertion of a new BiV PPM without immediate complication. [id number]

## 2012-05-02 ENCOUNTER — Telehealth: Payer: Self-pay | Admitting: Internal Medicine

## 2012-05-02 NOTE — Telephone Encounter (Signed)
Patient's device was changed out and his communicator no longer works.   I explained that he will be getting a new one and not to worry about for now.

## 2012-05-02 NOTE — Op Note (Signed)
NAMEMARLEN, [name]               ACCOUNT NO.:  [id number]  MEDICAL RECORD NO.:  [id number]  LOCATION:  [facility]                         FACILITY:  [facility]  PHYSICIAN:  Doylene Canning. Ladona Ridgel, MD    DATE OF BIRTH:  [birth year]  DATE OF PROCEDURE:  05/01/2012 DATE OF DISCHARGE:  05/01/2012                              OPERATIVE REPORT   PROCEDURE PERFORMED:  Removal of a previously implanted biventricular ICD which had reached elective replacement and insertion of a new biventricular pacemaker.  INTRODUCTION:  The patient is an 78 year old male with chronic systolic heart failure, ischemic cardiomyopathy, chronic atrial fibrillation, and complete heart block.  He initially underwent biventricular ICD insertion several years ago.  He has reached elective replacement on his device.  Because of his advanced age and because he has not had any ventricular tachycardia therapies, the decision was made to place a biventricular pacemaker.  PROCEDURE:  After informed consent was obtained, the patient was taken to the diagnostic EP lab in a fasting state.  After usual preparation and draping, intravenous fentanyl and midazolam was given for sedation. A 30 mL of lidocaine was infiltrated into the left infraclavicular region.  A 6 cm incision was carried out over this region. Electrocautery was utilized to dissect down to the fascial plane.  The defibrillator was removed with gentle traction.  The leads were evaluated and found to be working satisfactorily.  The pacing thresholds both in the right ventricle and the left ventricle were less than 1 V at 0.4 msec.  The patient's underlying atrial rhythm was AFib.  With this demonstrated the new [employer] BiV pacemaker, serial number [id number] was connected to the old atrial RV and LV leads and placed back in the subcutaneous pocket.  The pocket was irrigated with antibiotic irrigation and the incision was closed with 2-0 and 3-0 Vicryl.  Benzoin  and Steri-Strips were painted on the skin, pressure dressing was applied, and the patient was returned to his room in satisfactory condition.  COMPLICATIONS:  There were no immediate procedure complications.  RESULTS:  Demonstrate successful removal of the previously implanted [employer] biventricular ICD and insertion of a new [employer] pacemaker without immediate procedure complication.     Doylene Canning. Ladona Ridgel, MD     GWT/MEDQ  D:  05/01/2012  T:  05/02/2012  Job:  [id number]

## 2012-05-02 NOTE — Telephone Encounter (Signed)
Please return call to patient [phone] regarding defib issues

## 2012-05-23 ENCOUNTER — Ambulatory Visit (INDEPENDENT_AMBULATORY_CARE_PROVIDER_SITE_OTHER): Payer: Medicare Other | Admitting: *Deleted

## 2012-05-23 DIAGNOSIS — Z7901 Long term (current) use of anticoagulants: Secondary | ICD-10-CM

## 2012-05-23 DIAGNOSIS — I4891 Unspecified atrial fibrillation: Secondary | ICD-10-CM

## 2012-05-23 DIAGNOSIS — I480 Paroxysmal atrial fibrillation: Secondary | ICD-10-CM

## 2012-05-29 ENCOUNTER — Ambulatory Visit (INDEPENDENT_AMBULATORY_CARE_PROVIDER_SITE_OTHER): Payer: Medicare Other | Admitting: *Deleted

## 2012-05-29 DIAGNOSIS — I480 Paroxysmal atrial fibrillation: Secondary | ICD-10-CM

## 2012-05-29 DIAGNOSIS — I4891 Unspecified atrial fibrillation: Secondary | ICD-10-CM

## 2012-05-29 LAB — PACEMAKER DEVICE OBSERVATION
DEVICE MODEL PM: 100087
LV LEAD IMPEDENCE PM: 622 Ohm
LV LEAD THRESHOLD: 0.7 V
VENTRICULAR PACING PM: 100

## 2012-05-29 NOTE — Progress Notes (Signed)
defib check in clinic  

## 2012-06-03 ENCOUNTER — Other Ambulatory Visit: Payer: Self-pay | Admitting: Cardiology

## 2012-06-03 NOTE — Telephone Encounter (Signed)
Spoke with pt, pt states he has been taking coreg 6.25mg  1 po daily for a while now. Just wanted to clarify that Dr. Dietrich Pates wanted patient to continue coreg daily instead of BID before sending in new RX. Please verify dose with Dr. Dietrich Pates and send in new RX,.  Thanks, [employer]

## 2012-06-04 ENCOUNTER — Other Ambulatory Visit: Payer: Self-pay | Admitting: *Deleted

## 2012-06-18 ENCOUNTER — Encounter: Payer: Self-pay | Admitting: Internal Medicine

## 2012-07-04 ENCOUNTER — Ambulatory Visit (INDEPENDENT_AMBULATORY_CARE_PROVIDER_SITE_OTHER): Payer: Medicare Other | Admitting: *Deleted

## 2012-07-04 DIAGNOSIS — I480 Paroxysmal atrial fibrillation: Secondary | ICD-10-CM

## 2012-07-04 DIAGNOSIS — I4891 Unspecified atrial fibrillation: Secondary | ICD-10-CM

## 2012-07-04 DIAGNOSIS — Z7901 Long term (current) use of anticoagulants: Secondary | ICD-10-CM

## 2012-07-04 LAB — POCT INR: INR: 2.8

## 2012-07-16 ENCOUNTER — Encounter: Payer: Self-pay | Admitting: *Deleted

## 2012-07-20 ENCOUNTER — Other Ambulatory Visit: Payer: Self-pay | Admitting: Cardiology

## 2012-07-22 NOTE — Telephone Encounter (Signed)
rx sent to pharmacy by e-script Per noted recall for pt to see MD Rothbart

## 2012-07-23 ENCOUNTER — Other Ambulatory Visit: Payer: Self-pay | Admitting: Cardiology

## 2012-07-23 MED ORDER — WARFARIN SODIUM 2 MG PO TABS: 2.0000 mg | ORAL_TABLET | Freq: Every day | ORAL | Status: DC

## 2012-07-25 ENCOUNTER — Ambulatory Visit (INDEPENDENT_AMBULATORY_CARE_PROVIDER_SITE_OTHER): Payer: Medicare Other | Admitting: Internal Medicine

## 2012-07-25 ENCOUNTER — Encounter: Payer: Self-pay | Admitting: Internal Medicine

## 2012-07-25 VITALS — BP 128/80 | HR 70 | Ht 71.0 in | Wt 183.0 lb

## 2012-07-25 DIAGNOSIS — Z950 Presence of cardiac pacemaker: Secondary | ICD-10-CM

## 2012-07-25 DIAGNOSIS — I5022 Chronic systolic (congestive) heart failure: Secondary | ICD-10-CM

## 2012-07-25 DIAGNOSIS — I428 Other cardiomyopathies: Secondary | ICD-10-CM

## 2012-07-25 DIAGNOSIS — I4891 Unspecified atrial fibrillation: Secondary | ICD-10-CM

## 2012-07-25 DIAGNOSIS — I480 Paroxysmal atrial fibrillation: Secondary | ICD-10-CM

## 2012-07-25 LAB — PACEMAKER DEVICE OBSERVATION
LV LEAD IMPEDENCE PM: 627 Ohm
VENTRICULAR PACING PM: 100

## 2012-07-25 NOTE — Assessment & Plan Note (Signed)
The patient's ventricular rate has been well-controlled. He remains in atrial fibrillation.

## 2012-07-25 NOTE — Assessment & Plan Note (Signed)
His [employer] biventricular pacemaker is working normally. We'll plan to recheck in several months. 

## 2012-07-25 NOTE — Progress Notes (Signed)
HPI [name] returns today for followup. He is a very pleasant 78 year old man with an ischemic cardiomyopathy, chronic systolic heart failure, paroxysmal atrial fibrillation, who recently underwent generator change from a biventricular ICD, to a biventricular pacemaker. He has done well in the interim. He denies chest pain or shortness of breath. He relates an episode of constipation which resolved. He also has several skin lesions. No fevers or chills. No Known Allergies   Current Outpatient Prescriptions  Medication Sig Dispense Refill  . carvedilol (COREG) 6.25 MG tablet Take 6.25 mg by mouth daily.      . furosemide (LASIX) 40 MG tablet Take 40 mg by mouth daily.      Marland Kitchen levothyroxine (SYNTHROID, LEVOTHROID) 112 MCG tablet Take 112 mcg by mouth daily.        . Multiple Vitamin (MULTIVITAMIN) tablet Take 1 tablet by mouth daily. Take 1/2 tablet a day.      . pravastatin (PRAVACHOL) 40 MG tablet TAKE ONE TABLET BY MOUTH EVERY DAY  30 tablet  1  . warfarin (COUMADIN) 2 MG tablet Take 1-1.5 tablets (2-3 mg total) by mouth daily. Take 1 tablet on mon, wed and fri. Take 1.5 tablets the rest of the week  45 tablet  3  . [DISCONTINUED] pravastatin (PRAVACHOL) 40 MG tablet Take 40 mg by mouth daily.         Past Medical History  Diagnosis Date  . Hypertension   . Hypothyroid   . Chronic anticoagulation     continued for both PAF & possible apical mural thrombus.  . Arteriosclerotic cardiovascular disease (ASCVD) 2003    status post CABG surgery-2003 following AMI; ischemic cardiomyopathy with an EF of 25%; s/p AICD implantation-2007; h/o LBBB; possible apical thrombus   . Paroxysmal atrial fibrillation 2003  . AICD (automatic cardioverter/defibrillator) present   . Hyperlipidemia   . Mitral regurgitation     Mild by echo in 08/2007 but moderate to severe in 09/2008  . Coronary artery disease   . CHF (congestive heart failure)   . Other primary cardiomyopathies     ROS:   All systems  reviewed and negative except as noted in the HPI.   Past Surgical History  Procedure Date  . Coronary artery bypass graft 2003  . Cardiac defibrillator placement 2007  . Insert / replace / remove pacemaker      No family history on file.   History   Social History  . Marital Status: Married    Spouse Name: N/A    Number of Children: N/A  . Years of Education: N/A   Occupational History  . Not on file.   Social History Main Topics  . Smoking status: Never Smoker   . Smokeless tobacco: Not on file  . Alcohol Use: No  . Drug Use: No  . Sexually Active: Not on file   Other Topics Concern  . Not on file   Social History Narrative  . No narrative on file     BP 128/80  Pulse 70  Ht 5\' 11"  (1.803 m)  Wt 183 lb (83.008 kg)  BMI 25.52 kg/m2  Physical Exam:  Well appearing elderly man, NAD HEENT: Unremarkable Neck:  No JVD, no thyromegally Lungs:  Clear with no wheezes, rales, or rhonchi. Well-healed pacemaker incision. HEART:  Regular rate rhythm, no murmurs, no rubs, no clicks Abd:  soft, positive bowel sounds, no organomegally, no rebound, no guarding Ext:  2 plus pulses, no edema, no cyanosis, no clubbing Skin:  No rashes no nodules Neuro:  CN II through XII intact, motor grossly intact  DEVICE  Normal device function.  See PaceArt for details.   Assess/Plan:

## 2012-07-25 NOTE — Patient Instructions (Signed)
Your physician recommends that you schedule a follow-up appointment in: April with Dr Taylor  

## 2012-07-25 NOTE — Assessment & Plan Note (Signed)
His symptoms are currently class II. He will continue his current medical therapy.

## 2012-07-26 ENCOUNTER — Encounter: Payer: Self-pay | Admitting: Internal Medicine

## 2012-08-12 ENCOUNTER — Other Ambulatory Visit: Payer: Self-pay | Admitting: Cardiology

## 2012-08-12 MED ORDER — PRAVASTATIN SODIUM 40 MG PO TABS: 40.0000 mg | ORAL_TABLET | Freq: Every day | ORAL | Status: DC

## 2012-08-20 ENCOUNTER — Other Ambulatory Visit: Payer: Self-pay | Admitting: Cardiology

## 2012-08-20 ENCOUNTER — Other Ambulatory Visit: Payer: Self-pay | Admitting: Internal Medicine

## 2012-08-22 ENCOUNTER — Ambulatory Visit (INDEPENDENT_AMBULATORY_CARE_PROVIDER_SITE_OTHER): Payer: Medicare Other | Admitting: *Deleted

## 2012-08-22 DIAGNOSIS — I4891 Unspecified atrial fibrillation: Secondary | ICD-10-CM

## 2012-08-22 DIAGNOSIS — I480 Paroxysmal atrial fibrillation: Secondary | ICD-10-CM

## 2012-08-22 DIAGNOSIS — Z7901 Long term (current) use of anticoagulants: Secondary | ICD-10-CM

## 2012-08-22 LAB — POCT INR: INR: 2.7

## 2012-10-10 ENCOUNTER — Ambulatory Visit (INDEPENDENT_AMBULATORY_CARE_PROVIDER_SITE_OTHER): Payer: Medicare Other | Admitting: Cardiology

## 2012-10-10 ENCOUNTER — Encounter: Payer: Self-pay | Admitting: Cardiology

## 2012-10-10 ENCOUNTER — Ambulatory Visit (INDEPENDENT_AMBULATORY_CARE_PROVIDER_SITE_OTHER): Payer: Medicare Other | Admitting: *Deleted

## 2012-10-10 VITALS — BP 122/82 | HR 71 | Ht 71.0 in | Wt 186.0 lb

## 2012-10-10 DIAGNOSIS — E782 Mixed hyperlipidemia: Secondary | ICD-10-CM

## 2012-10-10 DIAGNOSIS — Z950 Presence of cardiac pacemaker: Secondary | ICD-10-CM

## 2012-10-10 DIAGNOSIS — Z7901 Long term (current) use of anticoagulants: Secondary | ICD-10-CM

## 2012-10-10 DIAGNOSIS — I10 Essential (primary) hypertension: Secondary | ICD-10-CM

## 2012-10-10 DIAGNOSIS — I7090 Unspecified atherosclerosis: Secondary | ICD-10-CM

## 2012-10-10 DIAGNOSIS — I4891 Unspecified atrial fibrillation: Secondary | ICD-10-CM

## 2012-10-10 DIAGNOSIS — I480 Paroxysmal atrial fibrillation: Secondary | ICD-10-CM

## 2012-10-10 DIAGNOSIS — D696 Thrombocytopenia, unspecified: Secondary | ICD-10-CM

## 2012-10-10 DIAGNOSIS — I2510 Atherosclerotic heart disease of native coronary artery without angina pectoris: Secondary | ICD-10-CM

## 2012-10-10 DIAGNOSIS — I5022 Chronic systolic (congestive) heart failure: Secondary | ICD-10-CM

## 2012-10-10 DIAGNOSIS — E785 Hyperlipidemia, unspecified: Secondary | ICD-10-CM

## 2012-10-10 LAB — POCT INR: INR: 2.6

## 2012-10-10 MED ORDER — PRAVASTATIN SODIUM 40 MG PO TABS: 40.0000 mg | ORAL_TABLET | Freq: Every day | ORAL | Status: DC

## 2012-10-10 MED ORDER — FUROSEMIDE 40 MG PO TABS: 60.0000 mg | ORAL_TABLET | Freq: Every day | ORAL | Status: DC

## 2012-10-10 MED ORDER — LOSARTAN POTASSIUM 50 MG PO TABS: 50.0000 mg | ORAL_TABLET | Freq: Every day | ORAL | Status: DC

## 2012-10-10 NOTE — Assessment & Plan Note (Addendum)
Patient has no symptoms to suggest myocardial ischemia or progression of coronary disease. He has been stable in this regard since CABG surgery in 2003.  We will continue to optimally manage cardiovascular risk factors.

## 2012-10-10 NOTE — Patient Instructions (Addendum)
Your physician recommends that you schedule a follow-up appointment in: ONE MONTH WITH RR  Your physician has recommended you make the following change in your medication:   1) START LOSARTAN 50MG  ONE TAB DAILY 2) INCREASE FUROSEMIDE TO 60MG  DAILY  Your physician recommends that you return for lab work in: THIS WEEK (CMET,CBC,TSH, BNP LEVEL) SLIPS GIVEN  Your physician recommends that you return for lab work in: ANOTHER LAB DRAW IN 3 WEEKS-SLIPS WILL BE FAXED AND A LETTER WILL BE MAILED TO REMIND YOU OF THIS LAB WORK

## 2012-10-10 NOTE — Assessment & Plan Note (Signed)
Control of hyperlipidemia was excellent when last assessed 2 years ago. More recent laboratory results will be sought.

## 2012-10-10 NOTE — Assessment & Plan Note (Signed)
Stable and therapeutic anticoagulation. Stool for Hemoccult testing will be requested at his next visit.

## 2012-10-10 NOTE — Assessment & Plan Note (Signed)
Resolved

## 2012-10-10 NOTE — Progress Notes (Deleted)
Name: [name]    DOB: 05-30-1923  Age: 79 y.o.  MR#: [id number]       PCP:  Dwana Melena, MD      Insurance: @PAYORNAME @   CC:   No chief complaint on file.  MEDICATION LIST  VS BP 122/82  Pulse 71  Ht 5\' 11"  (1.803 m)  Wt 186 lb (84.369 kg)  BMI 25.94 kg/m2  Weights Current Weight  10/10/12 186 lb (84.369 kg)  07/25/12 183 lb (83.008 kg)  05/01/12 171 lb (77.565 kg)    Blood Pressure  BP Readings from Last 3 Encounters:  10/10/12 122/82  07/25/12 128/80  05/01/12 135/84     Admit date:  (Not on file) Last encounter with RMR:  08/20/2012   Allergy No Known Allergies  Current Outpatient Prescriptions  Medication Sig Dispense Refill  . furosemide (LASIX) 40 MG tablet TAKE ONE TABLET BY MOUTH EVERY OTHER DAY  30 tablet  2  . levothyroxine (SYNTHROID, LEVOTHROID) 112 MCG tablet Take 112 mcg by mouth daily.        . Multiple Vitamin (MULTIVITAMIN) tablet Take 1 tablet by mouth daily. Take 1/2 tablet a day.      . pravastatin (PRAVACHOL) 40 MG tablet Take 1 tablet (40 mg total) by mouth daily.  30 tablet  1  . warfarin (COUMADIN) 2 MG tablet Take 1-1.5 tablets (2-3 mg total) by mouth daily. Take 1 tablet on mon, wed and fri. Take 1.5 tablets the rest of the week  45 tablet  3  . carvedilol (COREG) 6.25 MG tablet Take 6.25 mg by mouth daily. May be 6.25 patient will call back        Discontinued Meds:    Medications Discontinued During This Encounter  Medication Reason  . furosemide (LASIX) 40 MG tablet Completed Course  . pravastatin (PRAVACHOL) 40 MG tablet Duplicate    Patient Active Problem List  Diagnosis  . HYPOTHYROIDISM  . HYPERLIPIDEMIA  . Chronic anticoagulation  . Hypertension  . Arteriosclerotic cardiovascular disease (ASCVD)  . Paroxysmal atrial fibrillation  . Biventricular cardiac pacemaker in situ  . Rash and nonspecific skin eruption  . Thrombocytopenia  . Renal failure (ARF), acute on chronic  . Increased urinary frequency  . Hypokalemia   . Other primary cardiomyopathies  . Chronic systolic heart failure    LABS Anti-coag visit on 08/22/2012  Component Date Value  . INR 08/22/2012 2.7   Office Visit on 07/25/2012  Component Date Value  . DEVICE MODEL PM 07/25/2012 100087   . [id number] 07/25/2012 Lewayne Bunting   M.D.   . Sherlon Handing 07/25/2012 Lewayne Bunting   M.D.   . Sherlon Handing 07/25/2012 Lewayne Bunting   M.D.   . PACEART TECH NOTES PM 07/25/2012                     Value:CRT-P device check in clinic. Normal device function. Thresholds, sensing, impedance consistent with previous measurements. Histograms appropriate for patient and level of activity. 100% A-fib, + coumadin.  9 ventricular high rate episodes. Patient                          bi-ventricularly pacing >100% of the time. Device programmed with appropriate safety margins. Device heart failure diagnostics are within normal limits and stable over time. Patient enrolled in remote follow-up/TTM's with [employer]. Plan to check device  remotely in 3 months and every 6 months in office.ROV 8/14 with Dr. Ladona Ridgel in [facility].  Marland Kitchen ATRIAL PACING PM 07/25/2012 0   . VENTRICULAR PACING PM 07/25/2012 100   . AL IMPEDENCE PM 07/25/2012 580   . RV LEAD IMPEDENCE PM 07/25/2012 453   . LV LEAD IMPEDENCE PM 07/25/2012 627      Results for this Opt Visit:     Results for orders placed in visit on 08/22/12  POCT INR      Component Value Range   INR 2.7      EKG Orders placed in visit on 10/10/12  . EKG 12-LEAD     Prior Assessment and Plan Problem List as of 10/10/2012            Cardiology Problems   HYPERLIPIDEMIA   Last Assessment & Plan Note   05/15/2011 Office Visit Signed 05/15/2011  8:11 PM by Kathlen Brunswick, MD    Adequate control of hyperlipidemia with current statin therapy.    Hypertension   Last Assessment & Plan Note   05/15/2011 Office Visit Signed 05/15/2011  8:10 PM by Kathlen Brunswick, MD    Blood pressure control is excellent;  continue current therapy.    Arteriosclerotic cardiovascular disease (ASCVD)   Last Assessment & Plan Note   05/15/2011 Office Visit Signed 05/15/2011  8:07 PM by Kathlen Brunswick, MD    Patient currently has no symptoms to suggest myocardial ischemia.  CHF is compensated.    Paroxysmal atrial fibrillation   Last Assessment & Plan Note   07/25/2012 Office Visit Signed 07/25/2012 12:03 PM by Marinus Maw, MD    The patient's ventricular rate has been well-controlled. He remains in atrial fibrillation.    Other primary cardiomyopathies   Chronic systolic heart failure   Last Assessment & Plan Note   07/25/2012 Office Visit Signed 07/25/2012 12:04 PM by Marinus Maw, MD    His symptoms are currently class II. He will continue his current medical therapy.      Other   HYPOTHYROIDISM   Chronic anticoagulation   Last Assessment & Plan Note   05/15/2011 Office Visit Addendum 05/15/2011  8:10 PM by Kathlen Brunswick, MD    He has done well with chronic anticoagulation.  We will continue to monitor CBC and stool Hemoccult testing to exclude occult GI blood loss.    Biventricular cardiac pacemaker in situ   Last Assessment & Plan Note   07/25/2012 Office Visit Signed 07/25/2012 12:02 PM by Marinus Maw, MD    His [facility] [employer] biventricular pacemaker is working normally. We'll plan to recheck in several months.    Rash and nonspecific skin eruption   Thrombocytopenia   Renal failure (ARF), acute on chronic   Increased urinary frequency   Hypokalemia       Imaging: No results found.   FRS Calculation: Score not calculated

## 2012-10-10 NOTE — Assessment & Plan Note (Signed)
No elevated blood pressures recorded in the past 3 years. Current medication is adequate for control of hypertension, but not optimally for patient's ischemic cardiomyopathy. He previously was treated with valsartan. Losartan will be started at a dose of 50 mg per day and his dose of carvedilol increased to 12.5 mg twice a day once active congestive heart failure is excluded.

## 2012-10-10 NOTE — Progress Notes (Signed)
Patient ID: [name], male   DOB: March 14, 1923, 79 y.o.   MRN: [id number]  HPI: [name] is seen after a two-year hiatus during which all of his cardiology issues were managed by Dr. Ladona Ridgel. He has done extremely well over this interval without any serious new medical problems and with no decompensation of his cardiac issues. In recent weeks, there has been a gradual weight gain of nearly 10 pounds. Patient reports no change in diet, salt intake or peripheral edema and denies all cardiopulmonary symptoms. He notes that he has been somewhat less active.  Prior to Admission medications   Medication Sig Start Date End Date Taking? Authorizing Provider  furosemide (LASIX) 40 MG tablet Take 1.5 tablets (60 mg total) by mouth daily. 10/10/12  Yes Kathlen Brunswick, MD  levothyroxine (SYNTHROID, LEVOTHROID) 112 MCG tablet Take 112 mcg by mouth daily.     Yes Historical Provider, MD  Multiple Vitamin (MULTIVITAMIN) tablet Take 1 tablet by mouth daily. Take 1/2 tablet a day.   Yes Historical Provider, MD  pravastatin (PRAVACHOL) 40 MG tablet Take 1 tablet (40 mg total) by mouth daily. 10/10/12  Yes Kathlen Brunswick, MD  warfarin (COUMADIN) 2 MG tablet Take 1-1.5 tablets (2-3 mg total) by mouth daily. Take 1 tablet on mon, wed and fri. Take 1.5 tablets the rest of the week 07/23/12  Yes Kathlen Brunswick, MD  carvedilol (COREG) 6.25 MG tablet Take 6.25 mg by mouth daily. May be 6.25 patient will call back    Historical Provider, MD  losartan (COZAAR) 50 MG tablet Take 1 tablet (50 mg total) by mouth daily. 10/10/12   Kathlen Brunswick, MD  No Known Allergies    Past medical history, social history, and family history reviewed and updated.  ROS: Denies orthopnea, PND, palpitations, lightheadedness, syncope, chest pain or exertional dyspnea. All other systems reviewed and are negative.  PHYSICAL EXAM: BP 122/82  Pulse 71  Ht 5\' 11"  (1.803 m)  Wt 84.369 kg (186 lb)  BMI 25.94 kg/m2 ; weight increased 3  pounds over the past 6 weeks and 15 pounds over the past 6 months General-Well developed; no acute distress Body habitus-proportionate weight and height Neck-No JVD; no HJR; no carotid bruits Lungs-clear lung fields; resonant to percussion Cardiovascular-normal PMI; normal S1 and prominent splitting of S2; laterally displaced PMI; RV lift; grade 1/6 systolic decrescendo murmur at the left sternal border; minimal early diastolic murmur versus rub when patient sitting-not appreciated when patient supine Abdomen-normal bowel sounds; soft and non-tender without masses or organomegaly Musculoskeletal-No deformities, no cyanosis or clubbing Neurologic-Normal cranial nerves; symmetric strength and tone Skin-Warm, no significant lesions Extremities-distal pulses intact; 1-2+ pretibial edema  EKG:  100% ventricular demand pacing; no atrial activity identified.  ASSESSMENT AND PLAN:  Adam Penuelas Bing, MD 10/10/2012 1:56 PM

## 2012-10-13 ENCOUNTER — Other Ambulatory Visit: Payer: Self-pay | Admitting: Cardiology

## 2012-10-14 ENCOUNTER — Encounter: Payer: Self-pay | Admitting: *Deleted

## 2012-10-23 ENCOUNTER — Other Ambulatory Visit: Payer: Self-pay | Admitting: *Deleted

## 2012-10-23 DIAGNOSIS — I5022 Chronic systolic (congestive) heart failure: Secondary | ICD-10-CM

## 2012-10-23 DIAGNOSIS — I10 Essential (primary) hypertension: Secondary | ICD-10-CM

## 2012-10-23 DIAGNOSIS — E782 Mixed hyperlipidemia: Secondary | ICD-10-CM

## 2012-10-23 DIAGNOSIS — I480 Paroxysmal atrial fibrillation: Secondary | ICD-10-CM

## 2012-10-25 ENCOUNTER — Encounter: Payer: Self-pay | Admitting: Cardiology

## 2012-10-25 ENCOUNTER — Encounter: Payer: Self-pay | Admitting: *Deleted

## 2012-10-25 DIAGNOSIS — N183 Chronic kidney disease, stage 3 unspecified: Secondary | ICD-10-CM | POA: Insufficient documentation

## 2012-10-25 LAB — COMPREHENSIVE METABOLIC PANEL
ALT: 11 U/L (ref 0–53)
AST: 25 U/L (ref 0–37)
Alkaline Phosphatase: 56 U/L (ref 39–117)
BUN: 28 mg/dL — ABNORMAL HIGH (ref 6–23)
Creat: 1.56 mg/dL — ABNORMAL HIGH (ref 0.50–1.35)
Potassium: 3.9 mEq/L (ref 3.5–5.3)

## 2012-10-25 LAB — CBC
MCH: 30.9 pg (ref 26.0–34.0)
MCHC: 34.1 g/dL (ref 30.0–36.0)
Platelets: 148 10*3/uL — ABNORMAL LOW (ref 150–400)
RBC: 4.53 MIL/uL (ref 4.22–5.81)

## 2012-10-25 LAB — BRAIN NATRIURETIC PEPTIDE: Brain Natriuretic Peptide: 253.6 pg/mL — ABNORMAL HIGH (ref 0.0–100.0)

## 2012-10-25 LAB — TSH: TSH: 0.538 u[IU]/mL (ref 0.350–4.500)

## 2012-11-04 ENCOUNTER — Ambulatory Visit: Payer: Medicare Other | Admitting: Cardiology

## 2012-11-08 ENCOUNTER — Ambulatory Visit: Payer: Medicare Other | Admitting: Cardiology

## 2012-11-28 ENCOUNTER — Other Ambulatory Visit: Payer: Self-pay | Admitting: *Deleted

## 2012-11-28 MED ORDER — WARFARIN SODIUM 2 MG PO TABS: 2.0000 mg | ORAL_TABLET | Freq: Every day | ORAL | Status: DC

## 2012-12-05 ENCOUNTER — Encounter: Payer: Self-pay | Admitting: Cardiology

## 2012-12-05 ENCOUNTER — Encounter: Payer: Self-pay | Admitting: *Deleted

## 2012-12-05 ENCOUNTER — Ambulatory Visit (INDEPENDENT_AMBULATORY_CARE_PROVIDER_SITE_OTHER): Payer: Medicare Other | Admitting: *Deleted

## 2012-12-05 ENCOUNTER — Ambulatory Visit (INDEPENDENT_AMBULATORY_CARE_PROVIDER_SITE_OTHER): Payer: Medicare Other | Admitting: Cardiology

## 2012-12-05 VITALS — BP 90/60 | HR 70 | Ht 71.5 in | Wt 187.0 lb

## 2012-12-05 DIAGNOSIS — I4891 Unspecified atrial fibrillation: Secondary | ICD-10-CM

## 2012-12-05 DIAGNOSIS — I2510 Atherosclerotic heart disease of native coronary artery without angina pectoris: Secondary | ICD-10-CM

## 2012-12-05 DIAGNOSIS — E785 Hyperlipidemia, unspecified: Secondary | ICD-10-CM

## 2012-12-05 DIAGNOSIS — Z950 Presence of cardiac pacemaker: Secondary | ICD-10-CM

## 2012-12-05 DIAGNOSIS — I480 Paroxysmal atrial fibrillation: Secondary | ICD-10-CM

## 2012-12-05 DIAGNOSIS — D696 Thrombocytopenia, unspecified: Secondary | ICD-10-CM

## 2012-12-05 DIAGNOSIS — Z7901 Long term (current) use of anticoagulants: Secondary | ICD-10-CM

## 2012-12-05 DIAGNOSIS — I429 Cardiomyopathy, unspecified: Secondary | ICD-10-CM

## 2012-12-05 DIAGNOSIS — I7090 Unspecified atherosclerosis: Secondary | ICD-10-CM

## 2012-12-05 DIAGNOSIS — I428 Other cardiomyopathies: Secondary | ICD-10-CM

## 2012-12-05 DIAGNOSIS — I10 Essential (primary) hypertension: Secondary | ICD-10-CM

## 2012-12-05 NOTE — Assessment & Plan Note (Signed)
Patient remains asymptomatic with respect to atrial fibrillation and continues to be maintained on full dose anticoagulation.

## 2012-12-05 NOTE — Progress Notes (Deleted)
Name: [name]    DOB: July 15, 1923  Age: 79 y.o.  MR#: [id number]       PCP:  Dwana Melena, MD      Insurance: Payor: [employer] [place] [employer]  Plan: BLUE MEDICARE  Product Type: *No Product type*    CC:   No chief complaint on file.  MEDICATION LIST  VS Filed Vitals:   12/05/12 1438  BP: 90/60  Pulse: 70  Height: 5' 11.5" (1.816 m)  Weight: 187 lb (84.823 kg)  SpO2: 97%    Weights Current Weight  12/05/12 187 lb (84.823 kg)  10/10/12 186 lb (84.369 kg)  07/25/12 183 lb (83.008 kg)    Blood Pressure  BP Readings from Last 3 Encounters:  12/05/12 90/60  10/10/12 122/82  07/25/12 128/80     Admit date:  (Not on file) Last encounter with RMR:  11/08/2012   Allergy Review of patient's allergies indicates no known allergies.  Current Outpatient Prescriptions  Medication Sig Dispense Refill  . carvedilol (COREG) 6.25 MG tablet Take 6.25 mg by mouth daily. May be 6.25 patient will call back      . furosemide (LASIX) 40 MG tablet Take 1.5 tablets (60 mg total) by mouth daily.  45 tablet  3  . levothyroxine (SYNTHROID, LEVOTHROID) 112 MCG tablet Take 112 mcg by mouth daily.        Marland Kitchen losartan (COZAAR) 50 MG tablet Take 1 tablet (50 mg total) by mouth daily.  30 tablet  5  . Multiple Vitamin (MULTIVITAMIN) tablet Take 1 tablet by mouth daily. Take 1/2 tablet a day.      . pravastatin (PRAVACHOL) 40 MG tablet TAKE ONE TABLET BY MOUTH EVERY DAY  30 tablet  0  . warfarin (COUMADIN) 2 MG tablet Take 1-1.5 tablets (2-3 mg total) by mouth daily. Take 1 tablet on mon, wed and fri. Take 1.5 tablets the rest of the week  45 tablet  3   No current facility-administered medications for this visit.    Discontinued Meds:   There are no discontinued medications.  Patient Active Problem List  Diagnosis  . Hypothyroidism  . Hyperlipidemia  . Chronic anticoagulation  . Hypertension  . Arteriosclerotic cardiovascular disease (ASCVD)  . Biventricular cardiac pacemaker in situ   . Thrombocytopenia  . Chronic kidney disease, stage III (moderate)    LABS    Component Value Date/Time   NA 142 10/24/2012 1555   NA 144 04/24/2012 1123   NA 136 06/02/2011 0529   K 3.9 10/24/2012 1555   K 4.5 04/24/2012 1123   K 3.6 06/02/2011 0529   CL 104 10/24/2012 1555   CL 105 04/24/2012 1123   CL 101 06/02/2011 0529   CO2 32 10/24/2012 1555   CO2 30 04/24/2012 1123   CO2 28 06/02/2011 0529   GLUCOSE 108* 10/24/2012 1555   GLUCOSE 71 04/24/2012 1123   GLUCOSE 100* 06/02/2011 0529   BUN 28* 10/24/2012 1555   BUN 26* 04/24/2012 1123   BUN 22 06/02/2011 0529   CREATININE 1.56* 10/24/2012 1555   CREATININE 1.50* 04/24/2012 1123   CREATININE 1.28 06/02/2011 0529   CREATININE 1.45* 06/01/2011 0406   CREATININE 1.79* 05/31/2011 1910   CREATININE 1.36* 05/25/2011 0916   CALCIUM 8.9 10/24/2012 1555   CALCIUM 9.2 04/24/2012 1123   CALCIUM 8.4 06/02/2011 0529   GFRNONAA 53* 06/02/2011 0529   GFRNONAA 46* 06/01/2011 0406   GFRNONAA 36* 05/31/2011 1910   GFRAA >60 06/02/2011 0529  GFRAA 56* 06/01/2011 0406   GFRAA 44* 05/31/2011 1910   CMP     Component Value Date/Time   NA 142 10/24/2012 1555   K 3.9 10/24/2012 1555   CL 104 10/24/2012 1555   CO2 32 10/24/2012 1555   GLUCOSE 108* 10/24/2012 1555   BUN 28* 10/24/2012 1555   CREATININE 1.56* 10/24/2012 1555   CREATININE 1.28 06/02/2011 0529   CALCIUM 8.9 10/24/2012 1555   PROT 6.1 10/24/2012 1555   ALBUMIN 3.9 10/24/2012 1555   AST 25 10/24/2012 1555   ALT 11 10/24/2012 1555   ALKPHOS 56 10/24/2012 1555   BILITOT 1.0 10/24/2012 1555   GFRNONAA 53* 06/02/2011 0529   GFRAA >60 06/02/2011 0529       Component Value Date/Time   WBC 6.9 10/24/2012 1555   WBC 7.4 04/24/2012 1123   WBC 4.7 06/02/2011 0529   HGB 14.0 10/24/2012 1555   HGB 13.8 04/24/2012 1123   HGB 12.9* 06/02/2011 0529   HCT 41.0 10/24/2012 1555   HCT 40.1 04/24/2012 1123   HCT 38.3* 06/02/2011 0529   MCV 90.5 10/24/2012 1555   MCV 93.9 04/24/2012 1123   MCV 91.4 06/02/2011 0529    Lipid Panel      Component Value Date/Time   CHOL 140 05/23/2011 1408   TRIG 58 05/23/2011 1408   HDL 47 05/23/2011 1408   CHOLHDL 3.0 05/23/2011 1408   VLDL 12 05/23/2011 1408   LDLCALC 81 05/23/2011 1408    ABG No results found for this basename: phart, pco2, pco2art, po2, po2art, hco3, tco2, acidbasedef, o2sat     Lab Results  Component Value Date   TSH 0.538 10/24/2012   BNP (last 3 results) No results found for this basename: PROBNP,  in the last 8760 hours Cardiac Panel (last 3 results) No results found for this basename: CKTOTAL, CKMB, TROPONINI, RELINDX,  in the last 72 hours  Iron/TIBC/Ferritin No results found for this basename: iron, tibc, ferritin     EKG Orders placed in visit on 10/10/12  . EKG 12-LEAD     Prior Assessment and Plan Problem List as of 12/05/2012     ICD-9-CM     Cardiology Problems   Hyperlipidemia   Last Assessment & Plan   10/10/2012 Office Visit Written 10/10/2012  2:05 PM by Kathlen Brunswick, MD     Control of hyperlipidemia was excellent when last assessed 2 years ago. More recent laboratory results will be sought.    Hypertension   Last Assessment & Plan   10/10/2012 Office Visit Written 10/10/2012  2:06 PM by Kathlen Brunswick, MD     No elevated blood pressures recorded in the past 3 years. Current medication is adequate for control of hypertension, but not optimally for patient's ischemic cardiomyopathy. He previously was treated with valsartan. Losartan will be started at a dose of 50 mg per day and his dose of carvedilol increased to 12.5 mg twice a day once active congestive heart failure is excluded.    Arteriosclerotic cardiovascular disease (ASCVD)   Last Assessment & Plan   10/10/2012 Office Visit Edited 10/10/2012  2:02 PM by Kathlen Brunswick, MD     Patient has no symptoms to suggest myocardial ischemia or progression of coronary disease. He has been stable in this regard since CABG surgery in 2003.  We will continue to optimally manage cardiovascular  risk factors.      Other   Hypothyroidism   Chronic anticoagulation   Last Assessment & Plan  10/10/2012 Office Visit Written 10/10/2012  2:04 PM by Kathlen Brunswick, MD     Stable and therapeutic anticoagulation. Stool for Hemoccult testing will be requested at his next visit.    Biventricular cardiac pacemaker in situ   Last Assessment & Plan   07/25/2012 Office Visit Written 07/25/2012 12:02 PM by Marinus Maw, MD     His [facility] [employer] biventricular pacemaker is working normally. We'll plan to recheck in several months.    Thrombocytopenia   Last Assessment & Plan   10/10/2012 Office Visit Written 10/10/2012  2:08 PM by Kathlen Brunswick, MD     Resolved.    Chronic kidney disease, stage III (moderate)       Imaging: No results found.

## 2012-12-05 NOTE — Assessment & Plan Note (Signed)
Patient has tolerated anticoagulation well with no occult or manifest blood loss.  We will continue to monitor with serial CBCs and FOBT.

## 2012-12-05 NOTE — Progress Notes (Signed)
Patient ID: [name], male   DOB: March 21, 1923, 79 y.o.   MRN: [id number]  HPI: Schedule return visit for this delightful octogenarian with long-standing ischemic cardiomyopathy. Since his last visit, he notes no improvement in malaise and fatigue, which he characterizes as "not that bad". He remains active and is planning a three-week vacation to [place] with traveling to be done by automobile. He underwent generator replacement in 04/2012 at which time an AICD/biventricular pacemaker was explanted and a biventricular device without defibrillation capability inserted.  Current Outpatient Prescriptions  Medication Sig Dispense Refill  . carvedilol (COREG) 6.25 MG tablet Take 6.25 mg by mouth daily. May be 6.25 patient will call back      . furosemide (LASIX) 40 MG tablet Take 1.5 tablets (60 mg total) by mouth daily.  45 tablet  3  . levothyroxine (SYNTHROID, LEVOTHROID) 112 MCG tablet Take 112 mcg by mouth daily.        Marland Kitchen losartan (COZAAR) 50 MG tablet Take 1 tablet (50 mg total) by mouth daily.  30 tablet  5  . Multiple Vitamin (MULTIVITAMIN) tablet Take 1 tablet by mouth daily. Take 1/2 tablet a day.      . pravastatin (PRAVACHOL) 40 MG tablet TAKE ONE TABLET BY MOUTH EVERY DAY  30 tablet  0  . warfarin (COUMADIN) 2 MG tablet Take 1-1.5 tablets (2-3 mg total) by mouth daily. Take 1 tablet on mon, wed and fri. Take 1.5 tablets the rest of the week  45 tablet  3   No current facility-administered medications for this visit.    No Known Allergies   Past medical history, social history, and family history reviewed and updated.  ROS: Denies chest pain, dyspnea, palpitations, lightheadedness or syncope. All other systems reviewed and are negative.  PHYSICAL EXAM: BP 90/60  Pulse 70  Ht 5' 11.5" (1.816 m)  Wt 84.823 kg (187 lb)  BMI 25.72 kg/m2  SpO2 97%;  Body mass index is 25.72 kg/(m^2). General-Well developed; no acute distress Body habitus-proportionate weight and height Neck-No JVD;  no carotid bruits Lungs-clear lung fields; resonant to percussion Cardiovascular-normal PMI; normal S1 and S2 Abdomen-normal bowel sounds; soft and non-tender without masses or organomegaly Musculoskeletal-No deformities, no cyanosis or clubbing Neurologic-Normal cranial nerves; symmetric strength and tone Skin-Warm, no significant lesions Extremities-distal pulses intact; no edema  [place] Bing, MD 12/05/2012  8:05 PM  ASSESSMENT AND PLAN

## 2012-12-05 NOTE — Assessment & Plan Note (Signed)
Despite advanced age, patient has done extremely well, now 11 years following myocardial infarction that resulted in ischemic cardiomyopathy.  Although he has nonspecific symptoms that could reflect low cardiac output, his functional status remains good, recent BNP level was only mildly elevated, and he has no physical findings to suggest decompensation. Blood pressure is marginal, he has had no symptoms of cerebral hypoperfusion, and current medications will be continued.  LV systolic function has not been assessed for the past 4 years. An echocardiogram will be repeated.

## 2012-12-05 NOTE — Patient Instructions (Addendum)
Your physician recommends that you schedule a follow-up appointment in: [name] and Dr Dietrich Pates on May 7 th  Your physician has requested that you have an echocardiogram. Echocardiography is a painless test that uses sound waves to create images of your heart. It provides your doctor with information about the size and shape of your heart and how well your heart's chambers and valves are working. This procedure takes approximately one hour. There are no restrictions for this procedure.  Your physician has requested that you regularly monitor and record your blood pressure readings at home. Please use the same machine at the same time of day to check your readings and record them to bring to your follow-up visit.  Stool Cards  Your physician recommends that you return for lab work in: 1 month

## 2012-12-05 NOTE — Assessment & Plan Note (Addendum)
Excellent control of hyperlipidemia on the most recent assessment I have performed 18 months ago. Considering patient's advanced age and with stable coronary disease for more than the past decade, further monitoring of lipid levels will likely not provide significant benefit.

## 2012-12-08 NOTE — Assessment & Plan Note (Signed)
Magnitude of thrombocytopenia has very from mild to moderate over the past few years, but is minimal to nonexistent at present.

## 2012-12-08 NOTE — Assessment & Plan Note (Signed)
Blood pressure measurements have been variably been normal for at least the past 3 years.  Current medications will be continued.

## 2012-12-12 ENCOUNTER — Ambulatory Visit (HOSPITAL_COMMUNITY)
Admission: RE | Admit: 2012-12-12 | Discharge: 2012-12-12 | Disposition: A | Discharge status: Home / Self Care | Payer: Medicare Other | Source: Ambulatory Visit | Attending: Cardiology | Admitting: Cardiology

## 2012-12-12 DIAGNOSIS — I428 Other cardiomyopathies: Secondary | ICD-10-CM | POA: Insufficient documentation

## 2012-12-12 DIAGNOSIS — I4891 Unspecified atrial fibrillation: Secondary | ICD-10-CM | POA: Insufficient documentation

## 2012-12-12 DIAGNOSIS — I429 Cardiomyopathy, unspecified: Secondary | ICD-10-CM

## 2012-12-12 DIAGNOSIS — I059 Rheumatic mitral valve disease, unspecified: Secondary | ICD-10-CM

## 2012-12-12 DIAGNOSIS — I10 Essential (primary) hypertension: Secondary | ICD-10-CM | POA: Insufficient documentation

## 2012-12-12 NOTE — Progress Notes (Signed)
*  PRELIMINARY RESULTS* Echocardiogram 2D Echocardiogram has been performed.  Conrad Cambria 12/12/2012, 12:01 PM

## 2012-12-16 ENCOUNTER — Encounter: Payer: Self-pay | Admitting: Cardiology

## 2012-12-25 ENCOUNTER — Telehealth: Payer: Self-pay | Admitting: Cardiology

## 2012-12-25 NOTE — Telephone Encounter (Signed)
Pt is going out of country and needs a letter with a list of medication we have them and for letter to state that he is needing to be on them per dr Dietrich Pates. Needs signed also.  Patient states they are leaving Friday and needs to pick it up Thursday./tmj

## 2012-12-26 NOTE — Telephone Encounter (Signed)
Please noted and advise

## 2012-12-26 NOTE — Telephone Encounter (Signed)
Printed pt last OV notes per noted current medications on 12-05-12 as well as MD RR notes to continue current medications highlighted, pending signature from Dr Macarthur Critchley, will give pt once arrival noted

## 2012-12-30 ENCOUNTER — Encounter: Payer: Medicare Other | Admitting: Internal Medicine

## 2013-01-06 ENCOUNTER — Encounter: Payer: Self-pay | Admitting: Cardiology

## 2013-01-07 ENCOUNTER — Encounter: Payer: Self-pay | Admitting: Cardiology

## 2013-01-08 ENCOUNTER — Ambulatory Visit: Payer: Medicare Other | Admitting: Cardiology

## 2013-01-15 ENCOUNTER — Encounter: Payer: Self-pay | Admitting: *Deleted

## 2013-01-30 ENCOUNTER — Encounter: Payer: Self-pay | Admitting: Cardiology

## 2013-01-30 ENCOUNTER — Ambulatory Visit: Payer: Medicare Other | Admitting: Cardiology

## 2013-01-30 ENCOUNTER — Ambulatory Visit (INDEPENDENT_AMBULATORY_CARE_PROVIDER_SITE_OTHER): Payer: Medicare Other | Admitting: Cardiology

## 2013-01-30 ENCOUNTER — Ambulatory Visit (INDEPENDENT_AMBULATORY_CARE_PROVIDER_SITE_OTHER): Payer: Medicare Other | Admitting: *Deleted

## 2013-01-30 VITALS — BP 115/72 | HR 69 | Ht 71.0 in | Wt 185.8 lb

## 2013-01-30 DIAGNOSIS — D696 Thrombocytopenia, unspecified: Secondary | ICD-10-CM

## 2013-01-30 DIAGNOSIS — E785 Hyperlipidemia, unspecified: Secondary | ICD-10-CM

## 2013-01-30 DIAGNOSIS — Z7901 Long term (current) use of anticoagulants: Secondary | ICD-10-CM

## 2013-01-30 DIAGNOSIS — I2510 Atherosclerotic heart disease of native coronary artery without angina pectoris: Secondary | ICD-10-CM

## 2013-01-30 DIAGNOSIS — Z950 Presence of cardiac pacemaker: Secondary | ICD-10-CM

## 2013-01-30 DIAGNOSIS — I4891 Unspecified atrial fibrillation: Secondary | ICD-10-CM

## 2013-01-30 DIAGNOSIS — I480 Paroxysmal atrial fibrillation: Secondary | ICD-10-CM

## 2013-01-30 DIAGNOSIS — I10 Essential (primary) hypertension: Secondary | ICD-10-CM

## 2013-01-30 DIAGNOSIS — I7090 Unspecified atherosclerosis: Secondary | ICD-10-CM

## 2013-01-30 DIAGNOSIS — N183 Chronic kidney disease, stage 3 unspecified: Secondary | ICD-10-CM

## 2013-01-30 NOTE — Assessment & Plan Note (Addendum)
No pulse irregularity at present-his rhythm is likely paced. No symptoms referable to his arrhythmia.

## 2013-01-30 NOTE — Assessment & Plan Note (Signed)
No significant blood pressure elevations identified since 2011. Onset of cardiomyopathy may have moderated hypertension. Current therapy appears effective and will be continued; in fact, cardiac medications cannot be provided in optimal dosage due to borderline hypotension. Since patient is doing well symptomatically, we will not push the dose of beta blocker or ACE inhibitor.

## 2013-01-30 NOTE — Patient Instructions (Addendum)
Your physician recommends that you schedule a follow-up appointment in: 4 MONTHS WITH CARDIOLOGIST AND RETURN VISIT WITH GT  Your physician recommends that you return for lab work in: TODAY (BMET-CBC-SLIPS GIVEN)

## 2013-01-30 NOTE — Progress Notes (Deleted)
Name: [name]    DOB: [birth year]  Age: 79 y.o.  MR#: [id number]       PCP:  Catalina Pizza, MD      Insurance: Payor: [employer] [place] [employer] / Plan: BLUE MEDICARE / Product Type: *No Product type* /   CC:   No chief complaint on file. no list  VS Filed Vitals:   01/30/13 1121  BP: 115/72  Pulse: 69  Height: 5\' 11"  (1.803 m)  Weight: 185 lb 12 oz (84.256 kg)    Weights Current Weight  01/30/13 185 lb 12 oz (84.256 kg)  12/05/12 187 lb (84.823 kg)  10/10/12 186 lb (84.369 kg)    Blood Pressure  BP Readings from Last 3 Encounters:  01/30/13 115/72  12/05/12 90/60  10/10/12 122/82     Admit date:  (Not on file) Last encounter with RMR:  01/08/2013   Allergy Review of patient's allergies indicates no known allergies.  Current Outpatient Prescriptions  Medication Sig Dispense Refill  . carvedilol (COREG) 6.25 MG tablet Take 6.25 mg by mouth daily. May be 6.25 patient will call back      . furosemide (LASIX) 40 MG tablet Take 1.5 tablets (60 mg total) by mouth daily.  45 tablet  3  . levothyroxine (SYNTHROID, LEVOTHROID) 112 MCG tablet Take 112 mcg by mouth daily.        Marland Kitchen losartan (COZAAR) 50 MG tablet Take 1 tablet (50 mg total) by mouth daily.  30 tablet  5  . Multiple Vitamin (MULTIVITAMIN) tablet Take 1 tablet by mouth daily. Take 1/2 tablet a day.      . pravastatin (PRAVACHOL) 40 MG tablet TAKE ONE TABLET BY MOUTH EVERY DAY  30 tablet  0  . warfarin (COUMADIN) 2 MG tablet Take 1-1.5 tablets (2-3 mg total) by mouth daily. Take 1 tablet on mon, wed and fri. Take 1.5 tablets the rest of the week  45 tablet  3   No current facility-administered medications for this visit.    Discontinued Meds:   There are no discontinued medications.  Patient Active Problem List   Diagnosis Date Noted  . Atrial fibrillation 12/05/2012  . Chronic kidney disease, stage III (moderate) 10/25/2012  . Thrombocytopenia 05/31/2011  . Hypertension   . Arteriosclerotic  cardiovascular disease (ASCVD)   . Biventricular cardiac pacemaker in situ   . Chronic anticoagulation 11/19/2010  . Hypothyroidism 08/31/2010  . Hyperlipidemia 08/31/2010    LABS    Component Value Date/Time   NA 142 10/24/2012 1555   NA 144 04/24/2012 1123   NA 136 06/02/2011 0529   K 3.9 10/24/2012 1555   K 4.5 04/24/2012 1123   K 3.6 06/02/2011 0529   CL 104 10/24/2012 1555   CL 105 04/24/2012 1123   CL 101 06/02/2011 0529   CO2 32 10/24/2012 1555   CO2 30 04/24/2012 1123   CO2 28 06/02/2011 0529   GLUCOSE 108* 10/24/2012 1555   GLUCOSE 71 04/24/2012 1123   GLUCOSE 100* 06/02/2011 0529   BUN 28* 10/24/2012 1555   BUN 26* 04/24/2012 1123   BUN 22 06/02/2011 0529   CREATININE 1.56* 10/24/2012 1555   CREATININE 1.50* 04/24/2012 1123   CREATININE 1.28 06/02/2011 0529   CREATININE 1.45* 06/01/2011 0406   CREATININE 1.79* 05/31/2011 1910   CREATININE 1.36* 05/25/2011 0916   CALCIUM 8.9 10/24/2012 1555   CALCIUM 9.2 04/24/2012 1123   CALCIUM 8.4 06/02/2011 0529   GFRNONAA 53* 06/02/2011 0529  GFRNONAA 46* 06/01/2011 0406   GFRNONAA 36* 05/31/2011 1910   GFRAA >60 06/02/2011 0529   GFRAA 56* 06/01/2011 0406   GFRAA 44* 05/31/2011 1910   CMP     Component Value Date/Time   NA 142 10/24/2012 1555   K 3.9 10/24/2012 1555   CL 104 10/24/2012 1555   CO2 32 10/24/2012 1555   GLUCOSE 108* 10/24/2012 1555   BUN 28* 10/24/2012 1555   CREATININE 1.56* 10/24/2012 1555   CREATININE 1.28 06/02/2011 0529   CALCIUM 8.9 10/24/2012 1555   PROT 6.1 10/24/2012 1555   ALBUMIN 3.9 10/24/2012 1555   AST 25 10/24/2012 1555   ALT 11 10/24/2012 1555   ALKPHOS 56 10/24/2012 1555   BILITOT 1.0 10/24/2012 1555   GFRNONAA 53* 06/02/2011 0529   GFRAA >60 06/02/2011 0529       Component Value Date/Time   WBC 6.9 10/24/2012 1555   WBC 7.4 04/24/2012 1123   WBC 4.7 06/02/2011 0529   HGB 14.0 10/24/2012 1555   HGB 13.8 04/24/2012 1123   HGB 12.9* 06/02/2011 0529   HCT 41.0 10/24/2012 1555   HCT 40.1 04/24/2012 1123   HCT 38.3*  06/02/2011 0529   MCV 90.5 10/24/2012 1555   MCV 93.9 04/24/2012 1123   MCV 91.4 06/02/2011 0529    Lipid Panel     Component Value Date/Time   CHOL 140 05/23/2011 1408   TRIG 58 05/23/2011 1408   HDL 47 05/23/2011 1408   CHOLHDL 3.0 05/23/2011 1408   VLDL 12 05/23/2011 1408   LDLCALC 81 05/23/2011 1408    ABG No results found for this basename: phart, pco2, pco2art, po2, po2art, hco3, tco2, acidbasedef, o2sat     Lab Results  Component Value Date   TSH 0.538 10/24/2012   BNP (last 3 results) No results found for this basename: PROBNP,  in the last 8760 hours Cardiac Panel (last 3 results) No results found for this basename: CKTOTAL, CKMB, TROPONINI, RELINDX,  in the last 72 hours  Iron/TIBC/Ferritin No results found for this basename: iron, tibc, ferritin     EKG Orders placed in visit on 10/10/12  . EKG 12-LEAD     Prior Assessment and Plan Problem List as of 01/30/2013   Hypothyroidism   Hyperlipidemia   Last Assessment & Plan   12/05/2012 Office Visit Edited 12/08/2012  3:21 PM by Kathlen Brunswick, MD     Excellent control of hyperlipidemia on the most recent assessment I have performed 18 months ago. Considering patient's advanced age and with stable coronary disease for more than the past decade, further monitoring of lipid levels will likely not provide significant benefit.    Chronic anticoagulation   Last Assessment & Plan   12/05/2012 Office Visit Written 12/05/2012  8:15 PM by Kathlen Brunswick, MD     Patient has tolerated anticoagulation well with no occult or manifest blood loss.  We will continue to monitor with serial CBCs and FOBT.    Hypertension   Last Assessment & Plan   12/05/2012 Office Visit Written 12/08/2012  3:22 PM by Kathlen Brunswick, MD     Blood pressure measurements have been variably been normal for at least the past 3 years.  Current medications will be continued.    Arteriosclerotic cardiovascular disease (ASCVD)   Last Assessment & Plan   12/05/2012  Office Visit Written 12/05/2012  8:10 PM by Kathlen Brunswick, MD     Despite advanced age, patient has done extremely well, now  11 years following myocardial infarction that resulted in ischemic cardiomyopathy.  Although he has nonspecific symptoms that could reflect low cardiac output, his functional status remains good, recent BNP level was only mildly elevated, and he has no physical findings to suggest decompensation. Blood pressure is marginal, he has had no symptoms of cerebral hypoperfusion, and current medications will be continued.  LV systolic function has not been assessed for the past 4 years. An echocardiogram will be repeated.    Biventricular cardiac pacemaker in situ   Last Assessment & Plan   07/25/2012 Office Visit Written 07/25/2012 12:02 PM by Marinus Maw, MD     His [facility] [employer] biventricular pacemaker is working normally. We'll plan to recheck in several months.    Thrombocytopenia   Last Assessment & Plan   12/05/2012 Office Visit Written 12/08/2012  3:23 PM by Kathlen Brunswick, MD     Magnitude of thrombocytopenia has very from mild to moderate over the past few years, but is minimal to nonexistent at present.    Chronic kidney disease, stage III (moderate)   Atrial fibrillation   Last Assessment & Plan   12/05/2012 Office Visit Written 12/05/2012  8:13 PM by Kathlen Brunswick, MD     Patient remains asymptomatic with respect to atrial fibrillation and continues to be maintained on full dose anticoagulation.        Imaging: No results found.

## 2013-01-30 NOTE — Assessment & Plan Note (Signed)
Thrombocytopenia has been moderate in the past, but was minimal when last assessed 3 months ago. A repeat CBC is pending.

## 2013-01-30 NOTE — Assessment & Plan Note (Signed)
Most recent lipid profile obtained 2 years ago revealed adequate control of hyperlipidemia. A repeat study has been ordered.

## 2013-01-30 NOTE — Progress Notes (Signed)
Patient ID: [name], male   DOB: 1923/04/23, 80 y.o.   MRN: [id number]  HPI: Schedule return visit for this delightful 79 year old gentleman with severe ischemic cardiomyopathy. Since his last visit, he has done generally well. He tolerated a 21 day driving trip through [place] without physical limitations and enjoyed it immensely. A few weeks ago, while working in the warm weather, he developed orthostatic weakness that resolved after resting inside the house.  Current Outpatient Prescriptions  Medication Sig Dispense Refill  . carvedilol (COREG) 6.25 MG tablet Take 6.25 mg by mouth daily. May be 6.25 patient will call back      . furosemide (LASIX) 40 MG tablet Take 1.5 tablets (60 mg total) by mouth daily.  45 tablet  3  . levothyroxine (SYNTHROID, LEVOTHROID) 112 MCG tablet Take 112 mcg by mouth daily.        Marland Kitchen losartan (COZAAR) 50 MG tablet Take 1 tablet (50 mg total) by mouth daily.  30 tablet  5  . Multiple Vitamin (MULTIVITAMIN) tablet Take 1 tablet by mouth daily. Take 1/2 tablet a day.      . pravastatin (PRAVACHOL) 40 MG tablet TAKE ONE TABLET BY MOUTH EVERY DAY  30 tablet  0  . warfarin (COUMADIN) 2 MG tablet Take 1-1.5 tablets (2-3 mg total) by mouth daily. Take 1 tablet on mon, wed and fri. Take 1.5 tablets the rest of the week  45 tablet  3   No current facility-administered medications for this visit.   No Known Allergies   Past medical history, social history, and family history reviewed and updated.  ROS: Denies chest pain, orthopnea, PND, palpitations, syncope or falls. He has mild chronic edema. All other systems reviewed and are negative.  PHYSICAL EXAM: BP 115/72  Pulse 69  Ht 5\' 11"  (1.803 m)  Wt 84.256 kg (185 lb 12 oz)  BMI 25.92 kg/m2;  Body mass index is 25.92 kg/(m^2). General-Well developed; no acute distress Body habitus-proportionate weight and height Neck-No JVD; no carotid bruits Lungs-clear lung fields; resonant to  percussion Cardiovascular-normal PMI; normal S1 and S2; modest systolic murmur at the left sternal border; no third heart sound Abdomen-normal bowel sounds; soft and non-tender without masses or organomegaly Musculoskeletal-No deformities, no cyanosis or clubbing Neurologic-Normal cranial nerves; symmetric strength and tone Skin-Warm, no significant lesions Extremities-distal pulses intact; 1/2+ pretibial edema; marked varicose veins; chronic stasis changes  Masthope Bing, MD 01/30/2013  11:49 AM  ASSESSMENT AND PLAN

## 2013-01-30 NOTE — Assessment & Plan Note (Signed)
Continued monitoring with frequent metabolic profiles is appropriate.

## 2013-01-30 NOTE — Assessment & Plan Note (Addendum)
He continues to do well with chronic anticoagulation.  INR as have been therapeutic and very stable for at least the past year.  FOBT cards returned today-results pending.  Most recent colonoscopy performed 10 years ago, but additional screening studies not appropriate due to the patient's advanced age.

## 2013-01-30 NOTE — Assessment & Plan Note (Signed)
Patient is overdue for a device check, and one will be scheduled within the next few weeks.

## 2013-01-30 NOTE — Assessment & Plan Note (Addendum)
Patient has done very well with respect to coronary disease and CABG surgery in 2003. EF at that time was 25%, but is now even lower with an estimate of 15% on a recent echocardiogram. He has had substantial mitral regurgitation on previous echocardiograms, but not on the study performed in 12/2012. Dose of carvedilol and losartan have been limited in the past due to relative hypotension. Patient reports no recent blood pressures below 110 systolic, but in light of his good functional status, I am not inclined to up titrate his medications.

## 2013-02-04 ENCOUNTER — Ambulatory Visit (INDEPENDENT_AMBULATORY_CARE_PROVIDER_SITE_OTHER): Payer: Medicare Other | Admitting: *Deleted

## 2013-02-04 DIAGNOSIS — Z7901 Long term (current) use of anticoagulants: Secondary | ICD-10-CM

## 2013-02-04 LAB — POC HEMOCCULT BLD/STL (HOME/3-CARD/SCREEN)
Card #2 Fecal Occult Blod, POC: NEGATIVE
Card #3 Fecal Occult Blood, POC: NEGATIVE

## 2013-02-04 LAB — CBC
HCT: 39.8 % (ref 39.0–52.0)
MCH: 30.6 pg (ref 26.0–34.0)
MCV: 90.9 fL (ref 78.0–100.0)
Platelets: 200 10*3/uL (ref 150–400)
RBC: 4.38 MIL/uL (ref 4.22–5.81)
RDW: 13.2 % (ref 11.5–15.5)

## 2013-02-05 ENCOUNTER — Encounter: Payer: Self-pay | Admitting: Cardiology

## 2013-02-05 LAB — BASIC METABOLIC PANEL
CO2: 31 mEq/L (ref 19–32)
Glucose, Bld: 127 mg/dL — ABNORMAL HIGH (ref 70–99)
Potassium: 4.1 mEq/L (ref 3.5–5.3)
Sodium: 143 mEq/L (ref 135–145)

## 2013-02-06 ENCOUNTER — Encounter: Payer: Self-pay | Admitting: *Deleted

## 2013-02-10 ENCOUNTER — Telehealth: Payer: Self-pay | Admitting: *Deleted

## 2013-02-10 NOTE — Telephone Encounter (Signed)
Patient left message about lab results he got in the mail. He would like kim to call him to go over them.

## 2013-02-11 NOTE — Telephone Encounter (Signed)
.  left message to have patient return my call.  

## 2013-02-17 ENCOUNTER — Ambulatory Visit (INDEPENDENT_AMBULATORY_CARE_PROVIDER_SITE_OTHER): Payer: Medicare Other | Admitting: *Deleted

## 2013-02-17 DIAGNOSIS — I428 Other cardiomyopathies: Secondary | ICD-10-CM

## 2013-02-17 DIAGNOSIS — I4891 Unspecified atrial fibrillation: Secondary | ICD-10-CM

## 2013-02-17 LAB — PACEMAKER DEVICE OBSERVATION
AL AMPLITUDE: 1.4 mv
DEVICE MODEL PM: 100087
LV LEAD IMPEDENCE PM: 99 Ohm
LV LEAD THRESHOLD: 0.7 V
RV LEAD THRESHOLD: 0.8 V

## 2013-02-17 NOTE — Progress Notes (Signed)
Bi V PPM check in office. 

## 2013-02-18 NOTE — Telephone Encounter (Signed)
Spoke to pt to advise results/instructions. Pt understood.  

## 2013-02-20 ENCOUNTER — Other Ambulatory Visit: Payer: Self-pay | Admitting: Cardiology

## 2013-02-21 ENCOUNTER — Other Ambulatory Visit: Payer: Self-pay | Admitting: Emergency Medicine

## 2013-02-21 MED ORDER — LOSARTAN POTASSIUM 50 MG PO TABS: 50.0000 mg | ORAL_TABLET | Freq: Every day | ORAL | Status: DC

## 2013-03-13 ENCOUNTER — Ambulatory Visit (INDEPENDENT_AMBULATORY_CARE_PROVIDER_SITE_OTHER): Payer: Medicare Other | Admitting: *Deleted

## 2013-03-13 DIAGNOSIS — I4891 Unspecified atrial fibrillation: Secondary | ICD-10-CM

## 2013-03-13 DIAGNOSIS — Z7901 Long term (current) use of anticoagulants: Secondary | ICD-10-CM

## 2013-03-13 DIAGNOSIS — I480 Paroxysmal atrial fibrillation: Secondary | ICD-10-CM

## 2013-03-13 LAB — POCT INR: INR: 3.2

## 2013-03-19 ENCOUNTER — Other Ambulatory Visit: Payer: Self-pay | Admitting: Cardiology

## 2013-03-19 ENCOUNTER — Encounter: Payer: Self-pay | Admitting: Internal Medicine

## 2013-03-19 NOTE — Telephone Encounter (Signed)
Medication sent via escribe.  

## 2013-04-01 ENCOUNTER — Other Ambulatory Visit: Payer: Self-pay | Admitting: Cardiology

## 2013-04-01 MED ORDER — WARFARIN SODIUM 2 MG PO TABS: 2.0000 mg | ORAL_TABLET | Freq: Every day | ORAL | Status: DC

## 2013-04-01 NOTE — Telephone Encounter (Signed)
Medication sent via escribe for Lasix

## 2013-04-18 ENCOUNTER — Other Ambulatory Visit: Payer: Self-pay | Admitting: *Deleted

## 2013-04-18 MED ORDER — WARFARIN SODIUM 2 MG PO TABS: 2.0000 mg | ORAL_TABLET | Freq: Every day | ORAL | Status: DC

## 2013-05-01 ENCOUNTER — Ambulatory Visit (INDEPENDENT_AMBULATORY_CARE_PROVIDER_SITE_OTHER): Payer: Medicare Other | Admitting: *Deleted

## 2013-05-01 DIAGNOSIS — I4891 Unspecified atrial fibrillation: Secondary | ICD-10-CM

## 2013-05-01 DIAGNOSIS — Z7901 Long term (current) use of anticoagulants: Secondary | ICD-10-CM

## 2013-05-01 DIAGNOSIS — I480 Paroxysmal atrial fibrillation: Secondary | ICD-10-CM

## 2013-05-01 LAB — POCT INR

## 2013-05-20 ENCOUNTER — Encounter: Payer: Self-pay | Admitting: Internal Medicine

## 2013-05-20 ENCOUNTER — Ambulatory Visit (INDEPENDENT_AMBULATORY_CARE_PROVIDER_SITE_OTHER): Payer: Medicare Other | Admitting: Internal Medicine

## 2013-05-20 VITALS — BP 119/78 | HR 69 | Ht 71.0 in | Wt 186.0 lb

## 2013-05-20 DIAGNOSIS — I428 Other cardiomyopathies: Secondary | ICD-10-CM

## 2013-05-20 DIAGNOSIS — I4891 Unspecified atrial fibrillation: Secondary | ICD-10-CM

## 2013-05-20 DIAGNOSIS — I5022 Chronic systolic (congestive) heart failure: Secondary | ICD-10-CM | POA: Insufficient documentation

## 2013-05-20 DIAGNOSIS — R21 Rash and other nonspecific skin eruption: Secondary | ICD-10-CM

## 2013-05-20 DIAGNOSIS — Z950 Presence of cardiac pacemaker: Secondary | ICD-10-CM

## 2013-05-20 LAB — PACEMAKER DEVICE OBSERVATION
DEVICE MODEL PM: 100087
RV LEAD THRESHOLD: 0.8 V

## 2013-05-20 NOTE — Patient Instructions (Addendum)
Your physician recommends that you schedule a follow-up appointment in: 6 months WITH DR Ladona Ridgel AND 6 MONTHS WITH PAULA  Your physician recommends you hold Pravastatin and give [place] a call if rash goes away.

## 2013-05-20 NOTE — Assessment & Plan Note (Signed)
He has underlying complete heart block, and his ventricular rates are well controlled. He will continue systemic anticoagulation with warfarin.

## 2013-05-20 NOTE — Assessment & Plan Note (Signed)
The patient has an erythematous macular rash on his lower legs, on the inner thighs down to the knee. It is worse on the right than the left. It does not involve the trunk or arms. It has had minimal improvement with topical steroids. I'm concerned that he may have a drug rash. He will stop his pravastatin for a month. If his rash resolves, and we will reconsider his cholesterol-lowering medications. If no improvement is noted after a month, he is instructed to stop taking losartan. If after 2 weeks there is still no improvement, he will restart the losartan, and we will ask him to followup with a dermatologist.

## 2013-05-20 NOTE — Assessment & Plan Note (Signed)
His biventricular pacemaker is working normally. We'll plan to recheck in several months. 

## 2013-05-20 NOTE — Progress Notes (Signed)
HPI [name] returns today for followup. He is a very pleasant 79 year old man with a long-standing ischemic cardiomyopathy, chronic atrial fibrillation, complete heart block, status post ICD implantation. After his device reached elective replacement, he was downgraded to a a biventricular pacemaker, and continues to do quite well. He denies chest pain, shortness of breath, or syncope. No peripheral edema. No Known Allergies   Current Outpatient Prescriptions  Medication Sig Dispense Refill  . carvedilol (COREG) 6.25 MG tablet Take 6.25 mg by mouth daily. May be 6.25 patient will call back      . furosemide (LASIX) 40 MG tablet TAKE ONE & ONE-HALF TABLETS BY MOUTH EVERY DAY  45 tablet  0  . levothyroxine (SYNTHROID, LEVOTHROID) 112 MCG tablet Take 112 mcg by mouth daily.        Marland Kitchen losartan (COZAAR) 50 MG tablet Take 1 tablet (50 mg total) by mouth daily.  30 tablet  5  . Multiple Vitamin (MULTIVITAMIN) tablet Take 1 tablet by mouth daily. Take 1/2 tablet a day.      . pravastatin (PRAVACHOL) 40 MG tablet TAKE ONE TABLET BY MOUTH EVERY DAY  30 tablet  6  . warfarin (COUMADIN) 2 MG tablet Take 1-1.5 tablets (2-3 mg total) by mouth daily. Take 1 tablet on mon, wed and fri. Take 1.5 tablets the rest of the week  45 tablet  3   No current facility-administered medications for this visit.     Past Medical History  Diagnosis Date  . Hypertension   . Hypothyroid   . Chronic anticoagulation     continued for both PAF & possible apical mural thrombus.  . Arteriosclerotic cardiovascular disease (ASCVD) 2003    status post CABG surgery-2003 following AMI; ischemic cardiomyopathy with an EF of 25%; s/p AICD implantation-2007; h/o LBBB; possible apical thrombus   . Paroxysmal atrial fibrillation 2003    Subsequently permanent  . AICD (automatic cardioverter/defibrillator) present   . Hyperlipidemia   . Mitral regurgitation     Mild by echo in 08/2007 but moderate to severe in 09/2008  . Coronary  artery disease   . CHF (congestive heart failure)   . Other primary cardiomyopathies     ROS:   All systems reviewed and negative except as noted in the HPI.   Past Surgical History  Procedure Laterality Date  . Coronary artery bypass graft  2003  . Cardiac defibrillator placement  2007  . Insert / replace / remove pacemaker       History reviewed. No pertinent family history.   History   Social History  . Marital Status: Married    Spouse Name: N/A    Number of Children: N/A  . Years of Education: N/A   Occupational History  . Not on file.   Social History Main Topics  . Smoking status: Never Smoker   . Smokeless tobacco: Not on file  . Alcohol Use: No  . Drug Use: No  . Sexual Activity: Not on file   Other Topics Concern  . Not on file   Social History Narrative  . No narrative on file     BP 119/78  Pulse 69  Ht 5\' 11"  (1.803 m)  Wt 186 lb (84.369 kg)  BMI 25.95 kg/m2  Physical Exam:  Well appearing elderly man, NAD HEENT: Unremarkable Neck:  7 cm JVD, no thyromegally Back:  No CVA tenderness Lungs:  Clear with no wheezes, rales, or rhonchi. HEART:  Regular rate rhythm, no murmurs, no rubs, no  clicks Abd:  soft, positive bowel sounds, no organomegally, no rebound, no guarding Ext:  2 plus pulses, no edema, no cyanosis, no clubbing Skin:  No rashes no nodules Neuro:  CN II through XII intact, motor grossly intact  DEVICE  Normal device function.  See PaceArt for details.   Assess/Plan:

## 2013-05-20 NOTE — Assessment & Plan Note (Signed)
Despite severe left ventricular dysfunction, his symptoms are very well compensated. He will continue his current medical therapy, and maintain a low-sodium diet.

## 2013-05-27 ENCOUNTER — Telehealth: Payer: Self-pay | Admitting: *Deleted

## 2013-05-27 NOTE — Telephone Encounter (Signed)
FYI per pt was advised at OV to call office if the Pravastatin ceasing does allow for his rash to go away, pt confirmed the medication held was the pravastatin

## 2013-05-27 NOTE — Telephone Encounter (Signed)
PT has been off Bp medication for about two weeks now and his rash has gone away

## 2013-06-12 ENCOUNTER — Ambulatory Visit (INDEPENDENT_AMBULATORY_CARE_PROVIDER_SITE_OTHER): Payer: Medicare Other | Admitting: *Deleted

## 2013-06-12 DIAGNOSIS — Z7901 Long term (current) use of anticoagulants: Secondary | ICD-10-CM

## 2013-06-12 DIAGNOSIS — I480 Paroxysmal atrial fibrillation: Secondary | ICD-10-CM

## 2013-06-12 DIAGNOSIS — I4891 Unspecified atrial fibrillation: Secondary | ICD-10-CM

## 2013-06-15 NOTE — Telephone Encounter (Signed)
Ok to leave off if rash has gone away. GT

## 2013-06-16 NOTE — Telephone Encounter (Signed)
.  left message to have patient return my call WITH WIFE

## 2013-06-16 NOTE — Telephone Encounter (Signed)
Spoke to pt to advise results/instructions. Pt understood.  

## 2013-07-10 ENCOUNTER — Encounter: Payer: Self-pay | Admitting: Cardiology

## 2013-07-10 ENCOUNTER — Telehealth: Payer: Self-pay | Admitting: Cardiology

## 2013-07-10 ENCOUNTER — Ambulatory Visit (INDEPENDENT_AMBULATORY_CARE_PROVIDER_SITE_OTHER): Payer: Medicare Other | Admitting: Cardiology

## 2013-07-10 VITALS — BP 124/78 | HR 72 | Ht 71.0 in | Wt 189.0 lb

## 2013-07-10 DIAGNOSIS — I2589 Other forms of chronic ischemic heart disease: Secondary | ICD-10-CM

## 2013-07-10 DIAGNOSIS — I4891 Unspecified atrial fibrillation: Secondary | ICD-10-CM

## 2013-07-10 DIAGNOSIS — I255 Ischemic cardiomyopathy: Secondary | ICD-10-CM

## 2013-07-10 DIAGNOSIS — E785 Hyperlipidemia, unspecified: Secondary | ICD-10-CM

## 2013-07-10 MED ORDER — FUROSEMIDE 40 MG PO TABS: 40.0000 mg | ORAL_TABLET | Freq: Every day | ORAL | Status: DC

## 2013-07-10 MED ORDER — CARVEDILOL 6.25 MG PO TABS: 6.2500 mg | ORAL_TABLET | Freq: Every day | ORAL | Status: DC

## 2013-07-10 MED ORDER — LOSARTAN POTASSIUM 50 MG PO TABS: 50.0000 mg | ORAL_TABLET | Freq: Every day | ORAL | Status: DC

## 2013-07-10 MED ORDER — LOVASTATIN 10 MG PO TABS: 10.0000 mg | ORAL_TABLET | Freq: Every day | ORAL | Status: DC

## 2013-07-10 NOTE — Progress Notes (Signed)
Clinical Summary [name] is a 79 y.o.male seen today for follow up of the following medical problems, this is our first visit.    1. ICM/CAD - prior CABG in 2003 at [facility]  - LVEF 15-20% by echo 12/2012 - titration of medications has been limited due to hypotension - denies any significant chest pain. Denies any significant DOE, can walk a few miles on level ground, does have some DOE with going up hill. No orthopnea, no PND, no LE edema - limiting sodium intake, avoiding NSAIDs - compliant with meds: coreg, losartan, lasix. Titration limited by low normal blood pressures, mild orthostatic symptoms. Was on aspirin, had very heavy bruising and has been off x 2 years.    2. Chronic afib - denies any palpitations - on coumadin, denies any troubles with bleeding. Does have some easy bruising.   3. Complete heart block - has biventricular pacmemaker, followed by Dr Ladona Ridgel, converted from BiV ICD to BiV pacemaker at last generator change.  - denies any lightheadedness or dizziness  4. HL - prava stopped last visit after developing a rash.  - rash resolved about 2 weeks after stopping pravastatin.  - not currently on statin    Past Medical History  Diagnosis Date  . Hypertension   . Hypothyroid   . Chronic anticoagulation     continued for both PAF & possible apical mural thrombus.  . Arteriosclerotic cardiovascular disease (ASCVD) 2003    status post CABG surgery-2003 following AMI; ischemic cardiomyopathy with an EF of 25%; s/p AICD implantation-2007; h/o LBBB; possible apical thrombus   . Paroxysmal atrial fibrillation 2003    Subsequently permanent  . AICD (automatic cardioverter/defibrillator) present   . Hyperlipidemia   . Mitral regurgitation     Mild by echo in 08/2007 but moderate to severe in 09/2008  . Coronary artery disease   . CHF (congestive heart failure)   . Other primary cardiomyopathies      No Known Allergies   Current Outpatient  Prescriptions  Medication Sig Dispense Refill  . carvedilol (COREG) 6.25 MG tablet Take 6.25 mg by mouth daily. May be 6.25 patient will call back      . furosemide (LASIX) 40 MG tablet TAKE ONE & ONE-HALF TABLETS BY MOUTH EVERY DAY  45 tablet  0  . levothyroxine (SYNTHROID, LEVOTHROID) 112 MCG tablet Take 112 mcg by mouth daily.        Marland Kitchen losartan (COZAAR) 50 MG tablet Take 1 tablet (50 mg total) by mouth daily.  30 tablet  5  . Multiple Vitamin (MULTIVITAMIN) tablet Take 1 tablet by mouth daily. Take 1/2 tablet a day.      . pravastatin (PRAVACHOL) 40 MG tablet TAKE ONE TABLET BY MOUTH EVERY DAY  30 tablet  6  . warfarin (COUMADIN) 2 MG tablet Take 1-1.5 tablets (2-3 mg total) by mouth daily. Take 1 tablet on mon, wed and fri. Take 1.5 tablets the rest of the week  45 tablet  3   No current facility-administered medications for this visit.     Past Surgical History  Procedure Laterality Date  . Coronary artery bypass graft  2003  . Cardiac defibrillator placement  2007  . Insert / replace / remove pacemaker       No Known Allergies    No family history on file.   Social History [name] reports that he has never smoked. He does not have any smokeless tobacco history on file. [name]  reports that he does not drink alcohol.   Review of Systems CONSTITUTIONAL: No weight loss, fever, chills, weakness or fatigue.  HEENT: Eyes: No visual loss, blurred vision, double vision or yellow sclerae.No hearing loss, sneezing, congestion, runny nose or sore throat.  SKIN: No rash or itching.  CARDIOVASCULAR: per HPI RESPIRATORY: per HPI.  GASTROINTESTINAL: No anorexia, nausea, vomiting or diarrhea. No abdominal pain or blood.  GENITOURINARY: No burning on urination, no polyuria NEUROLOGICAL: No headache, dizziness, syncope, paralysis, ataxia, numbness or tingling in the extremities. No change in bowel or bladder control.  MUSCULOSKELETAL: No muscle, back pain, joint pain or  stiffness.  LYMPHATICS: No enlarged nodes. No history of splenectomy.  PSYCHIATRIC: No history of depression or anxiety.  ENDOCRINOLOGIC: No reports of sweating, cold or heat intolerance. No polyuria or polydipsia.  Marland Kitchen   Physical Examination p 72 bp 124/78 Wt 189 lbs BMI 26 Gen: resting comfortably, no acute distress HEENT: no scleral icterus, pupils equal round and reactive, no palptable cervical adenopathy,  : RRR, no m/r/g, no JVD, no carotid bruits Resp: Clear to auscultation bilaterally GI: abdomen is soft, non-tender, non-distended, normal bowel sounds, no hepatosplenomegaly MSK: extremities are warm, no edema.  Skin: warm, no rash Neuro:  no focal deficits Psych: appropriate affect   Diagnostic Studies 12/2012 Echo: LVEF 15-20%, mild LVH, multiple WMAs, mild MR, PASP 31    Assessment and Plan  1. ICM/CAD - LVEF 15-20%, NYHA II, euvolemic in clinic today - titration of medications limited by low blood pressures and orthostatic symptoms - ICD changed to BiV pacemaker previously, he is followed by EP - continue current meds. Not on ASA because of heavy bruising prevoiusly  2. Chronic afib - denies any symptoms - continue rate control and coumadin for anticoagulation  3. Complete heart block - BiV pacemaker followed by EP, continue regular visits - denies any symptoms  4. Hyperlipidemia - prava stopped last visit due to rash, had been on for a few years per his report - unclear if allergy is a class effect, will try low dose lovastatin to see how he tolerates. Given his cardiovascular disease, he would benefit from a statin if he can tolerate. Instructed if any repeat symptoms to stop lovastatin.        Antoine Poche, M.D., F.A.C.C.

## 2013-07-10 NOTE — Telephone Encounter (Signed)
Received fax refill request  Rx #  Medication:   Qty  Sig:   Physician:    **needs clarification on RX Change per fax from [facility] / [facility]**

## 2013-07-10 NOTE — Patient Instructions (Addendum)
Your physician recommends that you schedule a follow-up appointment in: 3 months    Your physician has recommended you make the following change in your medication:   1) START TAKING LOVASTATIN 10MG  ONCE DAILY

## 2013-07-11 NOTE — Telephone Encounter (Signed)
RN CC spoke to pharmacy rep to clarify pt is to take one and a half tablets once daily, #135 with 3 refills sent in via escribe

## 2013-07-24 ENCOUNTER — Ambulatory Visit (INDEPENDENT_AMBULATORY_CARE_PROVIDER_SITE_OTHER): Payer: Medicare Other | Admitting: *Deleted

## 2013-07-24 DIAGNOSIS — I4891 Unspecified atrial fibrillation: Secondary | ICD-10-CM

## 2013-07-24 DIAGNOSIS — Z7901 Long term (current) use of anticoagulants: Secondary | ICD-10-CM

## 2013-07-24 DIAGNOSIS — I480 Paroxysmal atrial fibrillation: Secondary | ICD-10-CM

## 2013-09-08 IMAGING — CR DG CHEST 2V
2 series · 2 of 2 positions shown · non-contrast
Comparison: Chest radiograph performed 09/01/2010

CLINICAL DATA: Weakness and low grade fever.

CHEST - 2 VIEW

[view not recorded (1 of 2)]
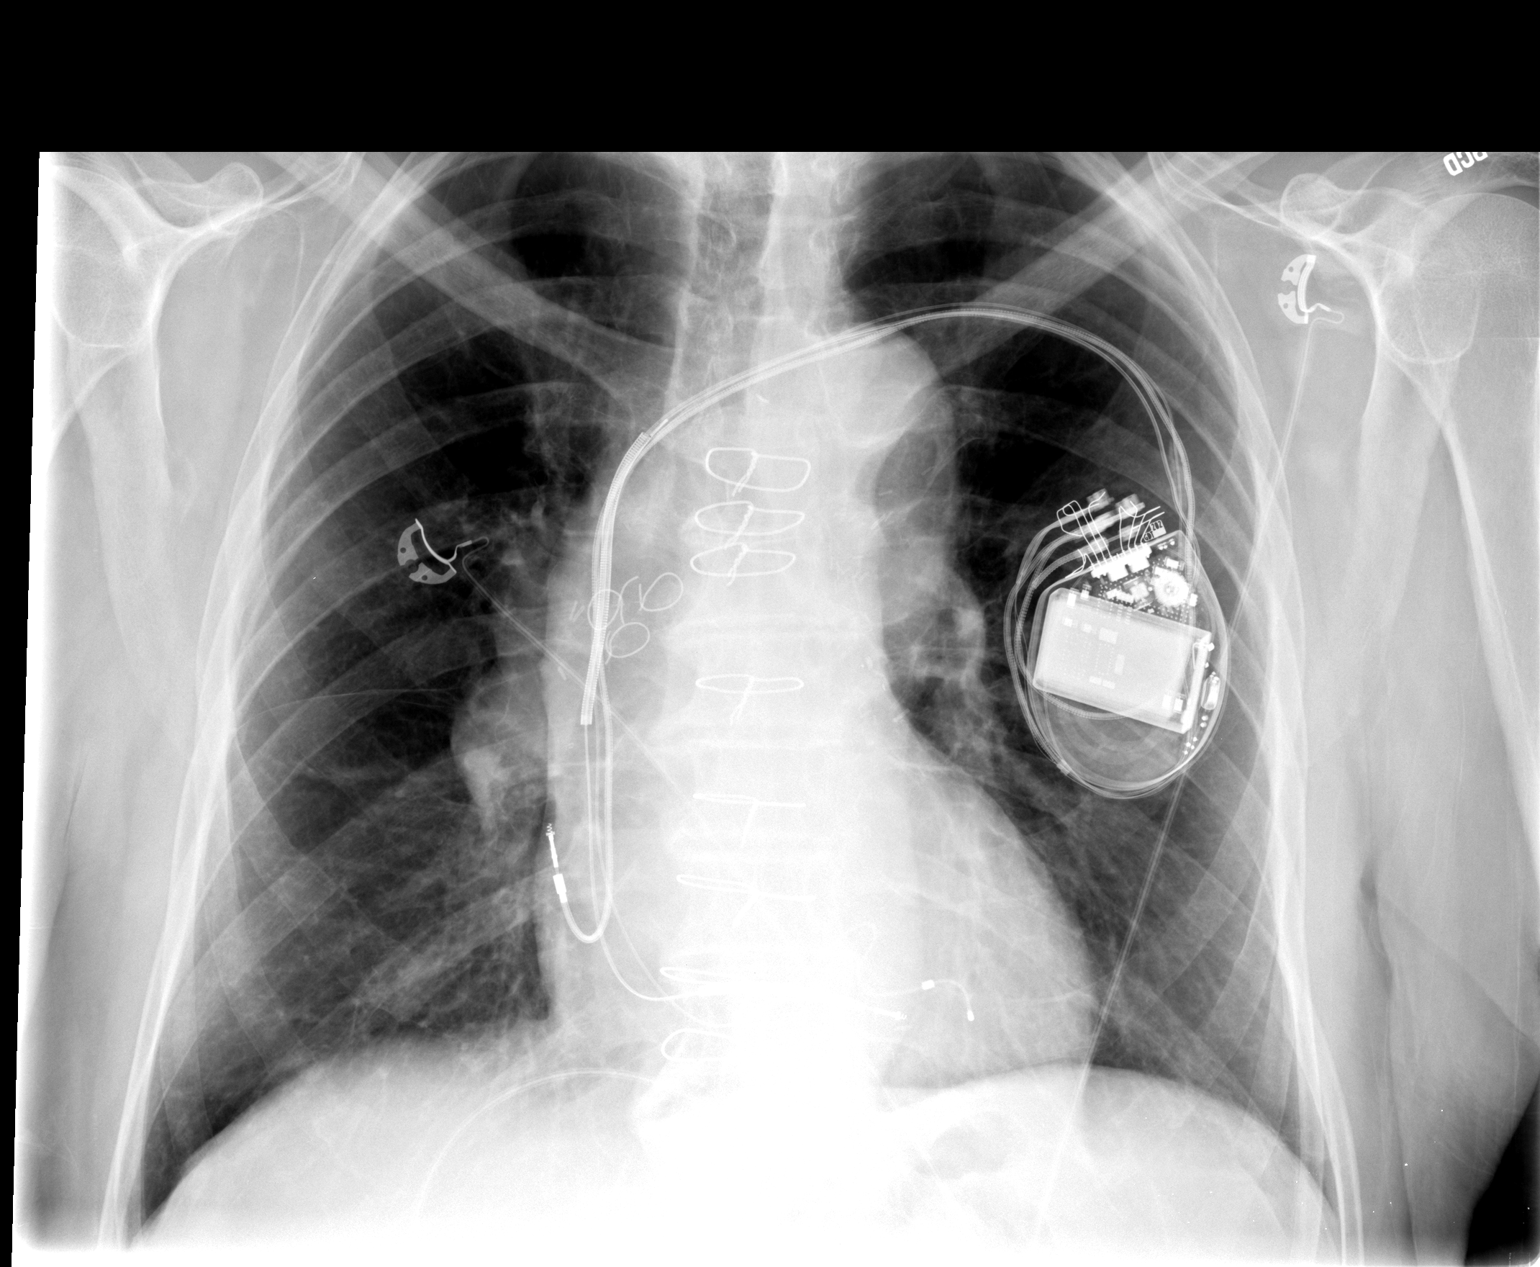

[view not recorded (2 of 2)]
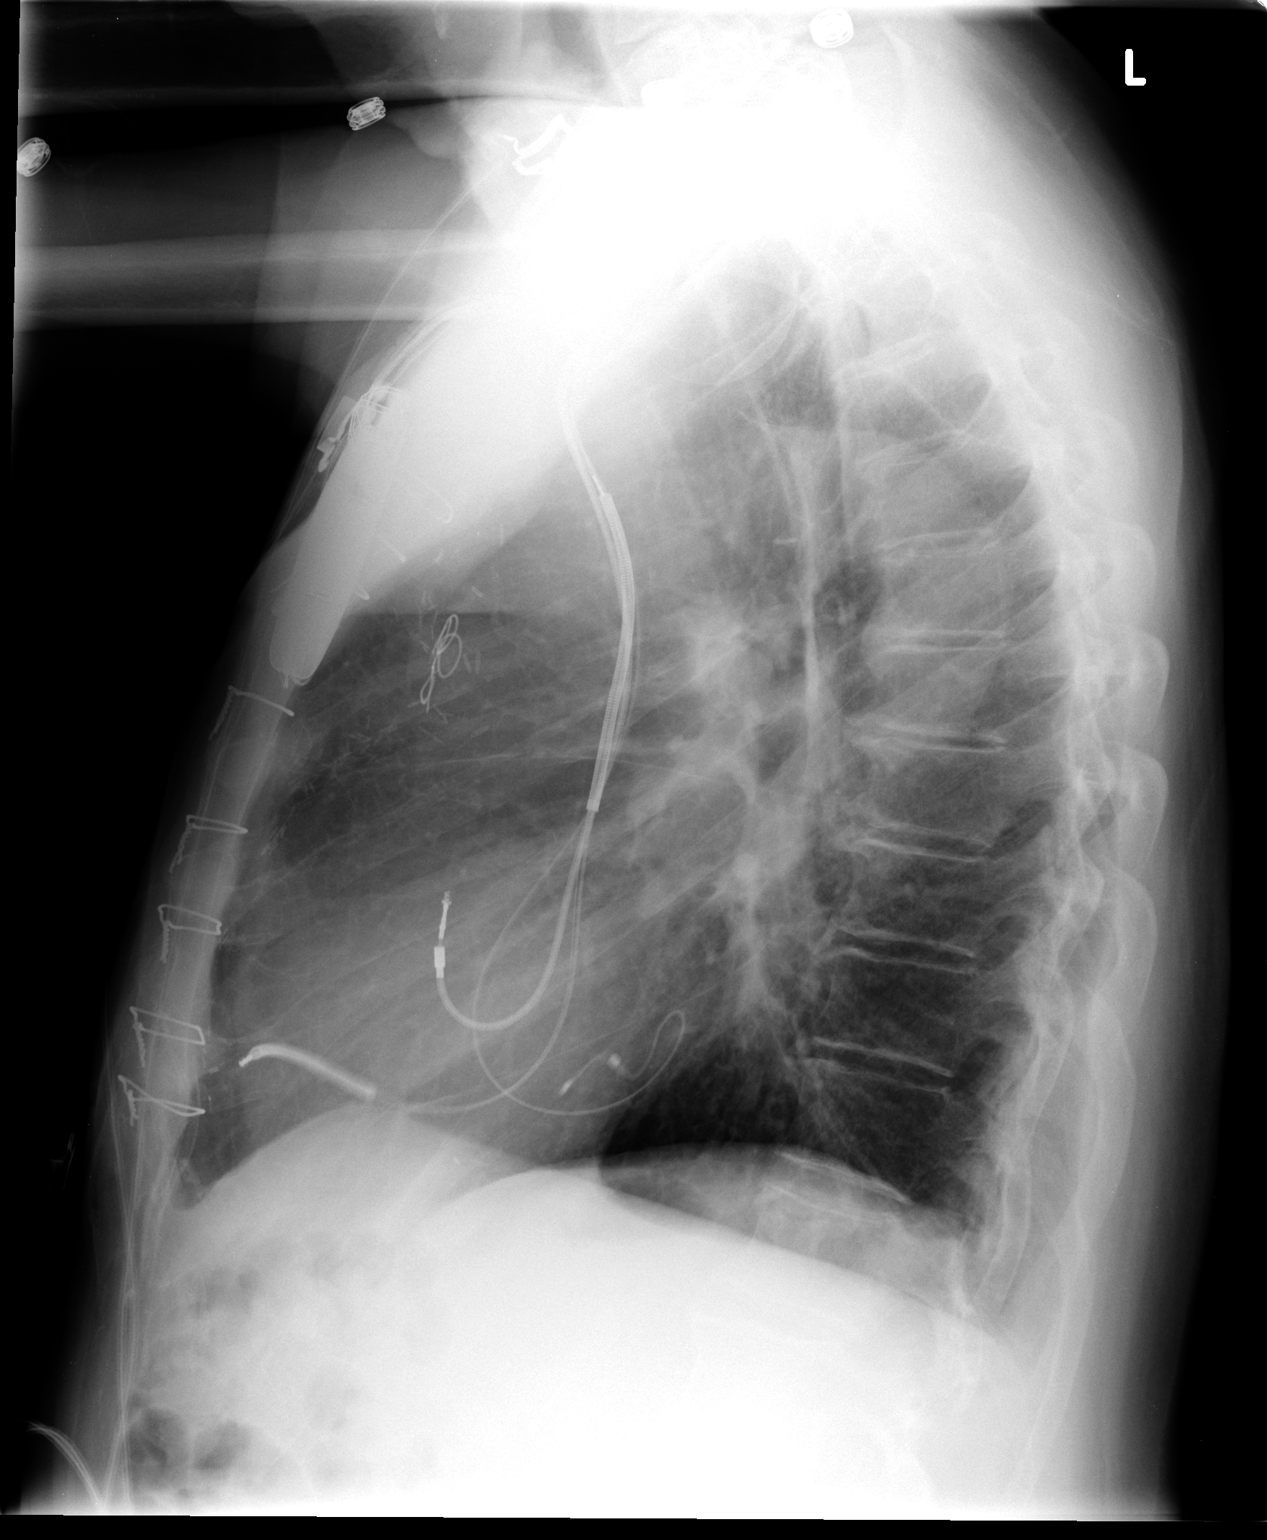

[2 of 2 positions shown; findings below may reference images not displayed]

FINDINGS: The lungs are well-aerated and clear.  There is no
evidence of focal opacification, pleural effusion or pneumothorax.

The heart is borderline normal in size; prominence of the hila is
stable from 0733 and likely reflects normal vasculature.  The
patient is status post median sternotomy, with evidence of prior
CABG.  A pacemaker/AICD is noted at the left chest wall, with leads
ending at the right atrium, right ventricle and coronary sinus.  No
acute osseous abnormalities are seen.
IMPRESSION: No acute cardiopulmonary process seen.

## 2013-09-18 ENCOUNTER — Ambulatory Visit (INDEPENDENT_AMBULATORY_CARE_PROVIDER_SITE_OTHER): Payer: Medicare Other | Admitting: *Deleted

## 2013-09-18 DIAGNOSIS — I4891 Unspecified atrial fibrillation: Secondary | ICD-10-CM

## 2013-09-18 DIAGNOSIS — I480 Paroxysmal atrial fibrillation: Secondary | ICD-10-CM

## 2013-09-18 DIAGNOSIS — Z7901 Long term (current) use of anticoagulants: Secondary | ICD-10-CM

## 2013-09-18 LAB — POCT INR: INR: 2

## 2013-10-13 ENCOUNTER — Encounter: Payer: Self-pay | Admitting: Cardiology

## 2013-10-13 ENCOUNTER — Encounter (INDEPENDENT_AMBULATORY_CARE_PROVIDER_SITE_OTHER): Payer: Self-pay

## 2013-10-13 ENCOUNTER — Ambulatory Visit (INDEPENDENT_AMBULATORY_CARE_PROVIDER_SITE_OTHER): Payer: Medicare Other | Admitting: Cardiology

## 2013-10-13 VITALS — BP 96/63 | HR 70 | Ht 71.0 in | Wt 179.0 lb

## 2013-10-13 DIAGNOSIS — E785 Hyperlipidemia, unspecified: Secondary | ICD-10-CM

## 2013-10-13 DIAGNOSIS — I5022 Chronic systolic (congestive) heart failure: Secondary | ICD-10-CM

## 2013-10-13 DIAGNOSIS — I442 Atrioventricular block, complete: Secondary | ICD-10-CM

## 2013-10-13 DIAGNOSIS — I4891 Unspecified atrial fibrillation: Secondary | ICD-10-CM

## 2013-10-13 DIAGNOSIS — I2510 Atherosclerotic heart disease of native coronary artery without angina pectoris: Secondary | ICD-10-CM

## 2013-10-13 MED ORDER — AZITHROMYCIN 250 MG PO TABS: ORAL_TABLET | ORAL | Status: DC

## 2013-10-13 NOTE — Patient Instructions (Signed)
Your physician recommends that you schedule a follow-up appointment in: 3 months with Dr Wyline MoodBranch  Your physician has recommended you make the following change in your medication:   Stop Lovastatin Start Zithromycin 500 mg on day 1 and 250 mg 4 days following.

## 2013-10-13 NOTE — Progress Notes (Signed)
Clinical Summary [name] is a 80 y.o.male seen today for follow up of the following medical problems.  1. ICM/CAD  - prior CABG in 2003 at [facility]  - LVEF 15-20% by echo 12/2012  - titration of medications has been limited due to hypotension  - denies any significant chest pain. Denies any significant DOE, can walk a few miles on level ground, does have some DOE with going up hill. No orthopnea, no PND, no LE edema  - limiting sodium intake, avoiding NSAIDs   - compliant with meds: coreg, losartan, lasix. Titration limited by low normal blood pressures, mild orthostatic symptoms. Was on aspirin, had very heavy bruising and has been off x 2 years.   2. Chronic afib  - denies any palpitations  - on coumadin, denies any troubles with bleeding. Does have some easy bruising.   3. Complete heart block  - has biventricular pacmemaker, followed by Dr Ladona Ridgel, converted from BiV ICD to BiV pacemaker at last generator change.  - denies any lightheadedness or dizziness  - has f/u November 28, 2013  4. HL  - prava stopped last visit after developing a rash. .  - tried lovastatin last visit but this also caused rash, he has stopped taking.   5. Cough - x 15 days, productive cough - no fevers or chills.   Past Medical History  Diagnosis Date  . Hypertension   . Hypothyroid   . Chronic anticoagulation     continued for both PAF & possible apical mural thrombus.  . Arteriosclerotic cardiovascular disease (ASCVD) 2003    status post CABG surgery-2003 following AMI; ischemic cardiomyopathy with an EF of 25%; s/p AICD implantation-2007; h/o LBBB; possible apical thrombus   . Paroxysmal atrial fibrillation 2003    Subsequently permanent  . AICD (automatic cardioverter/defibrillator) present   . Hyperlipidemia   . Mitral regurgitation     Mild by echo in 08/2007 but moderate to severe in 09/2008  . Coronary artery disease   . CHF (congestive heart failure)   . Other primary  cardiomyopathies      No Known Allergies   Current Outpatient Prescriptions  Medication Sig Dispense Refill  . carvedilol (COREG) 6.25 MG tablet Take 1 tablet (6.25 mg total) by mouth daily. May be 6.25 patient will call back  90 tablet  1  . furosemide (LASIX) 40 MG tablet Take 1 tablet (40 mg total) by mouth daily.  135 tablet  1  . levothyroxine (SYNTHROID, LEVOTHROID) 112 MCG tablet Take 112 mcg by mouth daily.        Marland Kitchen losartan (COZAAR) 50 MG tablet Take 1 tablet (50 mg total) by mouth daily.  90 tablet  1  . lovastatin (MEVACOR) 10 MG tablet Take 1 tablet (10 mg total) by mouth at bedtime.  90 tablet  1  . Multiple Vitamin (MULTIVITAMIN) tablet Take 1 tablet by mouth daily. Take 1/2 tablet a day.      . warfarin (COUMADIN) 2 MG tablet Take 1-1.5 tablets (2-3 mg total) by mouth daily. Take 1 tablet on mon, wed and fri. Take 1.5 tablets the rest of the week  45 tablet  3   No current facility-administered medications for this visit.     Past Surgical History  Procedure Laterality Date  . Coronary artery bypass graft  2003  . Cardiac defibrillator placement  2007  . Insert / replace / remove pacemaker       No Known Allergies  No family history on file.   Social History [name] reports that he has never smoked. He does not have any smokeless tobacco history on file. [name] reports that he does not drink alcohol.   Review of Systems CONSTITUTIONAL: No weight loss, fever, chills, weakness or fatigue.  HEENT: Eyes: No visual loss, blurred vision, double vision or yellow sclerae.No hearing loss, sneezing, congestion, runny nose or sore throat.  SKIN: No rash or itching.  CARDIOVASCULAR: per HPI RESPIRATORY: cough GASTROINTESTINAL: No anorexia, nausea, vomiting or diarrhea. No abdominal pain or blood.  GENITOURINARY: No burning on urination, no polyuria NEUROLOGICAL: No headache, dizziness, syncope, paralysis, ataxia, numbness or tingling in the extremities. No  change in bowel or bladder control.  MUSCULOSKELETAL: No muscle, back pain, joint pain or stiffness.  LYMPHATICS: No enlarged nodes. No history of splenectomy.  PSYCHIATRIC: No history of depression or anxiety.  ENDOCRINOLOGIC: No reports of sweating, cold or heat intolerance. No polyuria or polydipsia.  Marland Kitchen.   Physical Examination Gen: resting comfortably, no acute distress HEENT: no scleral icterus, pupils equal round and reactive, no palptable cervical adenopathy,  CV: irreg, no m/r/g, no JVD, no carotid bruits Resp: Clear to auscultation bilaterally GI: abdomen is soft, non-tender, non-distended, normal bowel sounds, no hepatosplenomegaly MSK: extremities are warm, no edema.  Skin: warm, no rash Neuro:  no focal deficits Psych: appropriate affect   Diagnostic Studies  12/2012 Echo: LVEF 15-20%, mild LVH, multiple WMAs, mild MR, PASP 31    Assessment and Plan  1. ICM/CAD  - LVEF 15-20%, NYHA II, euvolemic in clinic today  - titration of medications limited by low blood pressures and orthostatic symptoms  - ICD changed to BiV pacemaker previously, he is followed by EP  - continue current meds. Not on ASA because of heavy bruising while also on coumadin  2. Chronic afib  - denies any symptoms  - continue rate control and coumadin for anticoagulation   3. Complete heart block  - BiV pacemaker followed by EP, continue regular visits  - denies any symptoms   4. Hyperlipidemia  - developed rash to both prava and lova, statins stopped.  5. Cough -persistent productive cough x 15 days, will give course of azithro   Follow up 3 months    [name], M.D., F.A.C.C.

## 2013-10-23 ENCOUNTER — Ambulatory Visit (INDEPENDENT_AMBULATORY_CARE_PROVIDER_SITE_OTHER): Payer: Medicare Other | Admitting: *Deleted

## 2013-10-23 DIAGNOSIS — Z7901 Long term (current) use of anticoagulants: Secondary | ICD-10-CM

## 2013-10-23 DIAGNOSIS — I480 Paroxysmal atrial fibrillation: Secondary | ICD-10-CM

## 2013-10-23 DIAGNOSIS — I4891 Unspecified atrial fibrillation: Secondary | ICD-10-CM

## 2013-10-23 DIAGNOSIS — Z5181 Encounter for therapeutic drug level monitoring: Secondary | ICD-10-CM | POA: Insufficient documentation

## 2013-10-23 LAB — POCT INR: INR: 2.5

## 2013-11-28 ENCOUNTER — Encounter: Payer: Self-pay | Admitting: Internal Medicine

## 2013-11-28 ENCOUNTER — Ambulatory Visit (INDEPENDENT_AMBULATORY_CARE_PROVIDER_SITE_OTHER): Payer: Medicare Other | Admitting: Internal Medicine

## 2013-11-28 VITALS — BP 111/67 | HR 71 | Ht 71.0 in | Wt 180.0 lb

## 2013-11-28 DIAGNOSIS — Z950 Presence of cardiac pacemaker: Secondary | ICD-10-CM

## 2013-11-28 DIAGNOSIS — I5022 Chronic systolic (congestive) heart failure: Secondary | ICD-10-CM

## 2013-11-28 DIAGNOSIS — I4891 Unspecified atrial fibrillation: Secondary | ICD-10-CM

## 2013-11-28 LAB — MDC_IDC_ENUM_SESS_TYPE_INCLINIC
Battery Remaining Longevity: 84 mo
Date Time Interrogation Session: 20150327040000
Implantable Pulse Generator Serial Number: 100087
Lead Channel Impedance Value: 479 Ohm
Lead Channel Impedance Value: 615 Ohm
Lead Channel Impedance Value: 648 Ohm
Lead Channel Pacing Threshold Amplitude: 0.9 V
Lead Channel Pacing Threshold Pulse Width: 0.4 ms
Lead Channel Setting Pacing Amplitude: 2.4 V
Lead Channel Setting Pacing Pulse Width: 0.4 ms
Lead Channel Setting Pacing Pulse Width: 0.4 ms
Lead Channel Setting Sensing Sensitivity: 2.5 mV
MDC IDC MSMT LEADCHNL LV PACING THRESHOLD AMPLITUDE: 0.8 V
MDC IDC MSMT LEADCHNL RA SENSING INTR AMPL: 1.6 mV
MDC IDC MSMT LEADCHNL RV PACING THRESHOLD PULSEWIDTH: 0.4 ms
MDC IDC SET LEADCHNL LV SENSING SENSITIVITY: 2.5 mV
MDC IDC SET LEADCHNL RV PACING AMPLITUDE: 2 V
Zone Setting Detection Interval: 375 ms

## 2013-11-28 NOTE — Progress Notes (Signed)
HPI [name] returns today for followup. He is a very pleasant 80 year old man with a long-standing ischemic cardiomyopathy, chronic atrial fibrillation, complete heart block, status post ICD implantation. After his device reached elective replacement, he was downgraded to a a biventricular pacemaker, and continues to do quite well. He denies chest pain, shortness of breath, or syncope. No peripheral edema. Allergies  Allergen Reactions  . Statins Rash     Current Outpatient Prescriptions  Medication Sig Dispense Refill  . carvedilol (COREG) 6.25 MG tablet Take 1 tablet (6.25 mg total) by mouth daily. May be 6.25 patient will call back  90 tablet  1  . furosemide (LASIX) 40 MG tablet Take 1 tablet (40 mg total) by mouth daily.  135 tablet  1  . levothyroxine (SYNTHROID, LEVOTHROID) 112 MCG tablet Take 112 mcg by mouth daily.        Marland Kitchen. losartan (COZAAR) 50 MG tablet Take 1 tablet (50 mg total) by mouth daily.  90 tablet  1  . Multiple Vitamin (MULTIVITAMIN) tablet Take 1 tablet by mouth daily. Take 1/2 tablet a day.      . warfarin (COUMADIN) 2 MG tablet Take 1-1.5 tablets (2-3 mg total) by mouth daily. Take 1 tablet on mon, wed and fri. Take 1.5 tablets the rest of the week  45 tablet  3   No current facility-administered medications for this visit.     Past Medical History  Diagnosis Date  . Hypertension   . Hypothyroid   . Chronic anticoagulation     continued for both PAF & possible apical mural thrombus.  . Arteriosclerotic cardiovascular disease (ASCVD) 2003    status post CABG surgery-2003 following AMI; ischemic cardiomyopathy with an EF of 25%; s/p AICD implantation-2007; h/o LBBB; possible apical thrombus   . Paroxysmal atrial fibrillation 2003    Subsequently permanent  . AICD (automatic cardioverter/defibrillator) present   . Hyperlipidemia   . Mitral regurgitation     Mild by echo in 08/2007 but moderate to severe in 09/2008  . Coronary artery disease   . CHF  (congestive heart failure)   . Other primary cardiomyopathies     ROS:   All systems reviewed and negative except as noted in the HPI.   Past Surgical History  Procedure Laterality Date  . Coronary artery bypass graft  2003  . Cardiac defibrillator placement  2007  . Insert / replace / remove pacemaker       History reviewed. No pertinent family history.   History   Social History  . Marital Status: Married    Spouse Name: N/A    Number of Children: N/A  . Years of Education: N/A   Occupational History  . Not on file.   Social History Main Topics  . Smoking status: Never Smoker   . Smokeless tobacco: Not on file  . Alcohol Use: No  . Drug Use: No  . Sexual Activity: Not on file   Other Topics Concern  . Not on file   Social History Narrative  . No narrative on file     BP 111/67  Pulse 71  Ht 5\' 11"  (1.803 m)  Wt 180 lb (81.647 kg)  BMI 25.12 kg/m2  SpO2 100%  Physical Exam:  Well appearing elderly man, NAD HEENT: Unremarkable Neck:  7 cm JVD, no thyromegally Back:  No CVA tenderness Lungs:  Clear with no wheezes, rales, or rhonchi. HEART:  Regular rate rhythm, no murmurs, no rubs, no clicks Abd:  soft, positive  bowel sounds, no organomegally, no rebound, no guarding Ext:  2 plus pulses, no edema, no cyanosis, no clubbing Skin:  No rashes no nodules Neuro:  CN II through XII intact, motor grossly intact  DEVICE  Normal device function.  See PaceArt for details.   Assess/Plan:

## 2013-11-28 NOTE — Patient Instructions (Signed)
Your physician recommends that you schedule a follow-up appointment in: 6 months with Dr Taylor You will receive a reminder letter two months in advance reminding you to call and schedule your appointment. If you don't receive this letter, please contact our office.     

## 2013-11-28 NOTE — Assessment & Plan Note (Signed)
His heart failure remains class 2A. He will continue his current meds and I have asked him to maintain a low sodium diet.

## 2013-11-28 NOTE — Assessment & Plan Note (Signed)
His Biv PPM is working normally. Will recheck in several months.

## 2014-01-01 ENCOUNTER — Ambulatory Visit (INDEPENDENT_AMBULATORY_CARE_PROVIDER_SITE_OTHER): Payer: Medicare Other | Admitting: *Deleted

## 2014-01-01 DIAGNOSIS — Z5181 Encounter for therapeutic drug level monitoring: Secondary | ICD-10-CM

## 2014-01-01 DIAGNOSIS — I480 Paroxysmal atrial fibrillation: Secondary | ICD-10-CM

## 2014-01-01 DIAGNOSIS — I4891 Unspecified atrial fibrillation: Secondary | ICD-10-CM

## 2014-01-01 DIAGNOSIS — Z7901 Long term (current) use of anticoagulants: Secondary | ICD-10-CM

## 2014-01-01 LAB — POCT INR: INR: 1.8

## 2014-01-07 ENCOUNTER — Telehealth: Payer: Self-pay | Admitting: *Deleted

## 2014-01-07 NOTE — Telephone Encounter (Signed)
Told pt that Dr branch will not fill again. Pt has not been in office seen since Feb with a chest cold and he did this as a curtesy until seen by PCP.

## 2014-01-07 NOTE — Telephone Encounter (Signed)
Per pt Dr Wyline MoodBranch called in Rx for chest cold antibiotics and the patient is wanting to know if he will call him in another Rx for them. Pt states that he has the same chest cold/tmj

## 2014-01-19 ENCOUNTER — Ambulatory Visit (INDEPENDENT_AMBULATORY_CARE_PROVIDER_SITE_OTHER): Payer: Medicare Other | Admitting: Cardiology

## 2014-01-19 ENCOUNTER — Ambulatory Visit (INDEPENDENT_AMBULATORY_CARE_PROVIDER_SITE_OTHER): Payer: Medicare Other | Admitting: *Deleted

## 2014-01-19 ENCOUNTER — Encounter: Payer: Self-pay | Admitting: Cardiology

## 2014-01-19 VITALS — BP 107/73 | HR 84 | Ht 71.0 in | Wt 178.0 lb

## 2014-01-19 DIAGNOSIS — Z7901 Long term (current) use of anticoagulants: Secondary | ICD-10-CM

## 2014-01-19 DIAGNOSIS — I2510 Atherosclerotic heart disease of native coronary artery without angina pectoris: Secondary | ICD-10-CM

## 2014-01-19 DIAGNOSIS — I442 Atrioventricular block, complete: Secondary | ICD-10-CM

## 2014-01-19 DIAGNOSIS — E785 Hyperlipidemia, unspecified: Secondary | ICD-10-CM

## 2014-01-19 DIAGNOSIS — I4891 Unspecified atrial fibrillation: Secondary | ICD-10-CM

## 2014-01-19 DIAGNOSIS — Z5181 Encounter for therapeutic drug level monitoring: Secondary | ICD-10-CM

## 2014-01-19 DIAGNOSIS — I480 Paroxysmal atrial fibrillation: Secondary | ICD-10-CM

## 2014-01-19 LAB — POCT INR: INR: 2.1

## 2014-01-19 NOTE — Progress Notes (Signed)
Clinical Summary [name] is a 80 y.o.male seen today for follow up of the following medical problems.   1. ICM/CAD  - [facility]  - LVEF 15-20% by echo 12/2012  - titration of medications has been limited due to hypotension   - denies any significant chest pain. Denies any significant DOE, can walk a few miles on level ground, does have some DOE with going up hill. No orthopnea, no PND, no LE edema. Unchanged since last visit  - limiting sodium intake, avoiding NSAIDs  - compliant with meds: coreg, losartan, lasix. Titration limited by low normal blood pressures, mild orthostatic symptoms. Was on aspirin, had very heavy bruising and has been off x 2 years.   2. Chronic afib  - denies any palpitations  - on coumadin, denies any troubles with bleeding. Does have some easy bruising.   3. Complete heart block  - has biventricular pacmemaker, followed by Dr Ladona Ridgelaylor, converted from BiV ICD to BiV pacemaker at last generator change.  - denies any significant lightheadedness or dizziness  - device check 11/2013 normal function, BiV paced 100%  4. HL  - prava stopped last visit after developing a rash. .  - tried lovastatin last visit but this also caused rash, he has stopped taking.    Past Medical History  Diagnosis Date  . Hypertension   . Hypothyroid   . Chronic anticoagulation     continued for both PAF & possible apical mural thrombus.  . Arteriosclerotic cardiovascular disease (ASCVD) 2003    status post CABG surgery-2003 following AMI; ischemic cardiomyopathy with an EF of 25%; s/p AICD implantation-2007; h/o LBBB; possible apical thrombus   . Paroxysmal atrial fibrillation 2003    Subsequently permanent  . AICD (automatic cardioverter/defibrillator) present   . Hyperlipidemia   . Mitral regurgitation     Mild by echo in 08/2007 but moderate to severe in 09/2008  . Coronary artery disease   . CHF (congestive heart failure)   . Other primary  cardiomyopathies      Allergies  Allergen Reactions  . Statins Rash     Current Outpatient Prescriptions  Medication Sig Dispense Refill  . carvedilol (COREG) 6.25 MG tablet Take 1 tablet (6.25 mg total) by mouth daily. May be 6.25 patient will call back  90 tablet  1  . furosemide (LASIX) 40 MG tablet Take 1 tablet (40 mg total) by mouth daily.  135 tablet  1  . levothyroxine (SYNTHROID, LEVOTHROID) 112 MCG tablet Take 112 mcg by mouth daily.        Marland Kitchen. losartan (COZAAR) 50 MG tablet Take 1 tablet (50 mg total) by mouth daily.  90 tablet  1  . Multiple Vitamin (MULTIVITAMIN) tablet Take 1 tablet by mouth daily. Take 1/2 tablet a day.      . warfarin (COUMADIN) 2 MG tablet Take 1-1.5 tablets (2-3 mg total) by mouth daily. Take 1 tablet on mon, wed and fri. Take 1.5 tablets the rest of the week  45 tablet  3   No current facility-administered medications for this visit.     Past Surgical History  Procedure Laterality Date  . Coronary artery bypass graft  2003  . Cardiac defibrillator placement  2007  . Insert / replace / remove pacemaker       Allergies  Allergen Reactions  . Statins Rash      No family history on file.   Social History [name] reports  that he has never smoked. He does not have any smokeless tobacco history on file. [name] reports that he does not drink alcohol.   Review of Systems CONSTITUTIONAL: No weight loss, fever, chills, weakness or fatigue.  HEENT: Eyes: No visual loss, blurred vision, double vision or yellow sclerae.No hearing loss, sneezing, congestion, runny nose or sore throat.  SKIN: No rash or itching.  CARDIOVASCULAR: per HPI RESPIRATORY: No shortness of breath, cough or sputum.  GASTROINTESTINAL: No anorexia, nausea, vomiting or diarrhea. No abdominal pain or blood.  GENITOURINARY: No burning on urination, no polyuria NEUROLOGICAL: No headache, dizziness, syncope, paralysis, ataxia, numbness or tingling in the extremities. No  change in bowel or bladder control.  MUSCULOSKELETAL: No muscle, back pain, joint pain or stiffness.  LYMPHATICS: No enlarged nodes. No history of splenectomy.  PSYCHIATRIC: No history of depression or anxiety.  ENDOCRINOLOGIC: No reports of sweating, cold or heat intolerance. No polyuria or polydipsia.  Marland Kitchen.   Physical Examination p 84 bp 107/73 Wt 178 lbs BMI 25 Gen: resting comfortably, no acute distress HEENT: no scleral icterus, pupils equal round and reactive, no palptable cervical adenopathy,  CV: RRR, no m/r/g, no JVD, no carotid bruits Resp: Clear to auscultation bilaterally GI: abdomen is soft, non-tender, non-distended, normal bowel sounds, no hepatosplenomegaly MSK: extremities are warm, no edema.  Skin: warm, no rash Neuro:  no focal deficits Psych: appropriate affect   Diagnostic Studies 12/2012 Echo: LVEF 15-20%, mild LVH, multiple WMAs, mild MR, PASP 31     Assessment and Plan  1. ICM/CAD  - LVEF 15-20%, NYHA II, euvolemic in clinic today  - titration of medications limited by low blood pressures and orthostatic symptoms  - ICD changed to BiV pacemaker previously, he is followed by EP  - continue current meds. Not on ASA because of heavy bruising while also on coumadin   2. Chronic afib  - denies any symptoms  - continue rate control and coumadin for anticoagulation   3. Complete heart block  - BiV pacemaker followed by EP, continue regular visits  - denies any symptoms   4. Hyperlipidemia  - developed rash to both prava and lova, statins stopped.       F/u 6 months    Antoine PocheJonathan F. Kathalene Sporer, M.D., F.A.C.C.

## 2014-01-19 NOTE — Patient Instructions (Signed)
Your physician wants you to follow-up in: 6 months You will receive a reminder letter in the mail two months in advance. If you don't receive a letter, please call our office to schedule the follow-up appointment.     Your physician recommends that you continue on your current medications as directed. Please refer to the Current Medication list given to you today.      Thank you for choosing [facility] !        

## 2014-02-18 ENCOUNTER — Telehealth: Payer: Self-pay | Admitting: Cardiology

## 2014-02-18 MED ORDER — WARFARIN SODIUM 2 MG PO TABS: ORAL_TABLET | ORAL | Status: DC

## 2014-02-18 NOTE — Telephone Encounter (Signed)
Received fax refill request  Rx # [id number] Medication:  Warfarin 2 mg tab Qty 45 Sig:  Take one tablet by mouth daily on Mondays, Wednesdays and Fridays.  Then take one and one half tabs the rest of the week.  Physician:  Dietrich Patesothbart

## 2014-03-05 ENCOUNTER — Encounter: Payer: Self-pay | Admitting: *Deleted

## 2014-03-12 ENCOUNTER — Ambulatory Visit (INDEPENDENT_AMBULATORY_CARE_PROVIDER_SITE_OTHER): Payer: Medicare Other | Admitting: *Deleted

## 2014-03-12 DIAGNOSIS — I4891 Unspecified atrial fibrillation: Secondary | ICD-10-CM

## 2014-03-12 DIAGNOSIS — Z7901 Long term (current) use of anticoagulants: Secondary | ICD-10-CM

## 2014-03-12 DIAGNOSIS — I480 Paroxysmal atrial fibrillation: Secondary | ICD-10-CM

## 2014-03-12 DIAGNOSIS — Z5181 Encounter for therapeutic drug level monitoring: Secondary | ICD-10-CM

## 2014-03-12 LAB — POCT INR: INR: 2.9

## 2014-03-23 ENCOUNTER — Telehealth: Payer: Self-pay | Admitting: Cardiology

## 2014-03-23 MED ORDER — LOSARTAN POTASSIUM 50 MG PO TABS: 50.0000 mg | ORAL_TABLET | Freq: Every day | ORAL | Status: DC

## 2014-03-23 NOTE — Telephone Encounter (Signed)
Received fax refill request  Rx # [id number] Medication:  Losartan 50 mg tab Qty 90 Sig:  Take one tablet by mouth once daily Physician:  Wyline MoodBranch

## 2014-04-10 ENCOUNTER — Telehealth: Payer: Self-pay | Admitting: *Deleted

## 2014-04-10 MED ORDER — CARVEDILOL 6.25 MG PO TABS: 6.2500 mg | ORAL_TABLET | Freq: Every day | ORAL | Status: DC

## 2014-04-10 NOTE — Addendum Note (Signed)
Addended by: Marlyn CorporalARLTON, Taden Witter A on: 04/10/2014 04:06 PM   Modules accepted: Orders

## 2014-04-10 NOTE — Telephone Encounter (Signed)
Refill complete 

## 2014-04-10 NOTE — Telephone Encounter (Signed)
Attempted x 3 to escribe system down,called refill in

## 2014-04-10 NOTE — Telephone Encounter (Signed)
[facility]  CARVEDILOL 6.25 MG #90

## 2014-04-23 ENCOUNTER — Ambulatory Visit (INDEPENDENT_AMBULATORY_CARE_PROVIDER_SITE_OTHER): Payer: Medicare Other | Admitting: *Deleted

## 2014-04-23 DIAGNOSIS — I480 Paroxysmal atrial fibrillation: Secondary | ICD-10-CM

## 2014-04-23 DIAGNOSIS — Z5181 Encounter for therapeutic drug level monitoring: Secondary | ICD-10-CM

## 2014-04-23 DIAGNOSIS — Z7901 Long term (current) use of anticoagulants: Secondary | ICD-10-CM

## 2014-04-23 DIAGNOSIS — I4891 Unspecified atrial fibrillation: Secondary | ICD-10-CM

## 2014-04-23 LAB — POCT INR: INR: 2.1

## 2014-05-20 ENCOUNTER — Telehealth: Payer: Self-pay | Admitting: Cardiology

## 2014-05-20 MED ORDER — FUROSEMIDE 40 MG PO TABS: 40.0000 mg | ORAL_TABLET | Freq: Every day | ORAL | Status: DC

## 2014-05-20 NOTE — Telephone Encounter (Signed)
Medication sent to pharmacy  

## 2014-05-20 NOTE — Telephone Encounter (Signed)
Received fax refill request  Rx # [id number] Medication:  Furosemide 40 mg tab Qty 135 Sig:  Take one and one half tablets by mouth once daily Physician:  Wyline Mood

## 2014-05-21 ENCOUNTER — Encounter: Payer: Medicare Other | Admitting: Internal Medicine

## 2014-05-21 ENCOUNTER — Ambulatory Visit (INDEPENDENT_AMBULATORY_CARE_PROVIDER_SITE_OTHER): Payer: Medicare Other | Admitting: Internal Medicine

## 2014-05-21 ENCOUNTER — Encounter: Payer: Self-pay | Admitting: Internal Medicine

## 2014-05-21 VITALS — BP 110/62 | HR 72 | Ht 71.0 in | Wt 176.0 lb

## 2014-05-21 DIAGNOSIS — I5022 Chronic systolic (congestive) heart failure: Secondary | ICD-10-CM

## 2014-05-21 DIAGNOSIS — Z950 Presence of cardiac pacemaker: Secondary | ICD-10-CM

## 2014-05-21 DIAGNOSIS — I482 Chronic atrial fibrillation, unspecified: Secondary | ICD-10-CM

## 2014-05-21 DIAGNOSIS — I4891 Unspecified atrial fibrillation: Secondary | ICD-10-CM

## 2014-05-21 LAB — MDC_IDC_ENUM_SESS_TYPE_INCLINIC
Battery Remaining Longevity: 78 mo
Battery Remaining Percentage: 100 %
Brady Statistic RA Percent Paced: 0 %
Brady Statistic RV Percent Paced: 99 %
Implantable Pulse Generator Serial Number: 100087
Lead Channel Impedance Value: 663 Ohm
Lead Channel Pacing Threshold Amplitude: 0.7 V
Lead Channel Pacing Threshold Pulse Width: 0.4 ms
Lead Channel Sensing Intrinsic Amplitude: 1.3 mV
Lead Channel Sensing Intrinsic Amplitude: 20.2 mV
Lead Channel Sensing Intrinsic Amplitude: 8.6 mV
Lead Channel Setting Pacing Amplitude: 2 V
Lead Channel Setting Pacing Amplitude: 2.4 V
Lead Channel Setting Pacing Pulse Width: 0.4 ms
Lead Channel Setting Pacing Pulse Width: 0.4 ms
Lead Channel Setting Sensing Sensitivity: 2.5 mV
Lead Channel Setting Sensing Sensitivity: 2.5 mV
MDC IDC MSMT LEADCHNL RA IMPEDANCE VALUE: 630 Ohm
MDC IDC MSMT LEADCHNL RV IMPEDANCE VALUE: 480 Ohm
MDC IDC MSMT LEADCHNL RV PACING THRESHOLD AMPLITUDE: 0.8 V
MDC IDC MSMT LEADCHNL RV PACING THRESHOLD PULSEWIDTH: 0.4 ms
MDC IDC SESS DTM: 20150917190600
MDC IDC SET ZONE DETECTION INTERVAL: 375 ms

## 2014-05-21 NOTE — Patient Instructions (Addendum)
Your physician recommends that you schedule a follow-up appointment in: 6 months with [name] in the device clinic  Your physician wants you to follow-up in: 1 year with Dr. Court Joy will receive a reminder letter in the mail two months in advance. If you don't receive a letter, please call our office to schedule the follow-up appointment.  Your physician recommends that you continue on your current medications as directed. Please refer to the Current Medication list given to you today.

## 2014-05-21 NOTE — Progress Notes (Signed)
HPI [name] returns today for followup. He is a very pleasant 80 year old man with a long-standing ischemic cardiomyopathy, chronic atrial fibrillation, complete heart block, status post ICD implantation. After his device reached elective replacement, he was downgraded to a a biventricular pacemaker, and continues to do quite well. He denies chest pain, shortness of breath, or syncope. No peripheral edema. Allergies  Allergen Reactions  . Statins Rash     Current Outpatient Prescriptions  Medication Sig Dispense Refill  . carvedilol (COREG) 6.25 MG tablet Take 1 tablet (6.25 mg total) by mouth daily. May be 6.25 patient will call back  90 tablet  3  . furosemide (LASIX) 40 MG tablet Take 1 tablet (40 mg total) by mouth daily.  30 tablet  3  . levothyroxine (SYNTHROID, LEVOTHROID) 112 MCG tablet Take 112 mcg by mouth daily.        Marland Kitchen losartan (COZAAR) 50 MG tablet Take 1 tablet (50 mg total) by mouth daily.  30 tablet  6  . Multiple Vitamin (MULTIVITAMIN) tablet Take 1 tablet by mouth daily. Take 1/2 tablet a day.      . warfarin (COUMADIN) 2 MG tablet Take 1 tablet daily except 1 1/2 tablets on Sundays, Tuesdays and Thursdays  45 tablet  3   No current facility-administered medications for this visit.     Past Medical History  Diagnosis Date  . Hypertension   . Hypothyroid   . Chronic anticoagulation     continued for both PAF & possible apical mural thrombus.  . Arteriosclerotic cardiovascular disease (ASCVD) 2003    status post CABG surgery-2003 following AMI; ischemic cardiomyopathy with an EF of 25%; s/p AICD implantation-2007; h/o LBBB; possible apical thrombus   . Paroxysmal atrial fibrillation 2003    Subsequently permanent  . AICD (automatic cardioverter/defibrillator) present   . Hyperlipidemia   . Mitral regurgitation     Mild by echo in 08/2007 but moderate to severe in 09/2008  . Coronary artery disease   . CHF (congestive heart failure)   . Other primary  cardiomyopathies     ROS:   All systems reviewed and negative except as noted in the HPI.   Past Surgical History  Procedure Laterality Date  . Coronary artery bypass graft  2003  . Cardiac defibrillator placement  2007  . Insert / replace / remove pacemaker       History reviewed. No pertinent family history.   History   Social History  . Marital Status: Married    Spouse Name: N/A    Number of Children: N/A  . Years of Education: N/A   Occupational History  . Not on file.   Social History Main Topics  . Smoking status: Never Smoker   . Smokeless tobacco: Not on file  . Alcohol Use: No  . Drug Use: No  . Sexual Activity: Not on file   Other Topics Concern  . Not on file   Social History Narrative  . No narrative on file     BP 110/62  Pulse 72  Ht  (1.803 m)  Wt 176 lb (79.833 kg)  BMI 24.56 kg/m2  Physical Exam:  Well appearing elderly man, NAD HEENT: Unremarkable Neck:  7 cm JVD, no thyromegally Back:  No CVA tenderness Lungs:  Clear with no wheezes, rales, or rhonchi. HEART:  Regular rate rhythm, no murmurs, no rubs, no clicks Abd:  soft, positive bowel sounds, no organomegally, no rebound, no guarding Ext:  2 plus pulses, no edema,  no cyanosis, no clubbing Skin:  No rashes no nodules Neuro:  CN II through XII intact, motor grossly intact  DEVICE  Normal device function.  See PaceArt for details.   Assess/Plan:

## 2014-05-22 ENCOUNTER — Encounter: Payer: Self-pay | Admitting: Internal Medicine

## 2014-05-22 NOTE — Assessment & Plan Note (Signed)
His symptoms remain class 2, despite severe LV dysfunction and atrial fib. He will continue his current meds.

## 2014-05-22 NOTE — Assessment & Plan Note (Signed)
His [employer] Biv ICD is working normally. Will recheck in several months.

## 2014-05-22 NOTE — Assessment & Plan Note (Signed)
His ventricular rate is well controlled. No change in meds. 

## 2014-06-04 ENCOUNTER — Ambulatory Visit (INDEPENDENT_AMBULATORY_CARE_PROVIDER_SITE_OTHER): Payer: Medicare Other | Admitting: *Deleted

## 2014-06-04 DIAGNOSIS — I4891 Unspecified atrial fibrillation: Secondary | ICD-10-CM

## 2014-06-04 DIAGNOSIS — I480 Paroxysmal atrial fibrillation: Secondary | ICD-10-CM

## 2014-06-04 DIAGNOSIS — Z7901 Long term (current) use of anticoagulants: Secondary | ICD-10-CM

## 2014-06-04 DIAGNOSIS — Z5181 Encounter for therapeutic drug level monitoring: Secondary | ICD-10-CM

## 2014-06-04 LAB — POCT INR: INR: 2.2

## 2014-06-15 ENCOUNTER — Encounter: Payer: Self-pay | Admitting: Cardiology

## 2014-07-15 ENCOUNTER — Other Ambulatory Visit: Payer: Self-pay | Admitting: Cardiology

## 2014-07-15 ENCOUNTER — Ambulatory Visit (INDEPENDENT_AMBULATORY_CARE_PROVIDER_SITE_OTHER): Payer: Medicare Other | Admitting: *Deleted

## 2014-07-15 DIAGNOSIS — Z7901 Long term (current) use of anticoagulants: Secondary | ICD-10-CM

## 2014-07-15 DIAGNOSIS — I480 Paroxysmal atrial fibrillation: Secondary | ICD-10-CM

## 2014-07-15 DIAGNOSIS — Z5181 Encounter for therapeutic drug level monitoring: Secondary | ICD-10-CM

## 2014-07-15 DIAGNOSIS — I4891 Unspecified atrial fibrillation: Secondary | ICD-10-CM

## 2014-07-15 LAB — POCT INR: INR: 2.8

## 2014-08-13 ENCOUNTER — Encounter (HOSPITAL_COMMUNITY): Payer: Self-pay | Admitting: Internal Medicine

## 2014-08-13 ENCOUNTER — Ambulatory Visit: Payer: Medicare Other | Admitting: Cardiology

## 2014-08-26 ENCOUNTER — Ambulatory Visit (INDEPENDENT_AMBULATORY_CARE_PROVIDER_SITE_OTHER): Payer: Medicare Other | Admitting: *Deleted

## 2014-08-26 DIAGNOSIS — Z5181 Encounter for therapeutic drug level monitoring: Secondary | ICD-10-CM

## 2014-08-26 DIAGNOSIS — I480 Paroxysmal atrial fibrillation: Secondary | ICD-10-CM

## 2014-08-26 DIAGNOSIS — I4891 Unspecified atrial fibrillation: Secondary | ICD-10-CM

## 2014-08-26 DIAGNOSIS — Z7901 Long term (current) use of anticoagulants: Secondary | ICD-10-CM

## 2014-08-26 LAB — POCT INR: INR: 2.4

## 2014-08-27 ENCOUNTER — Telehealth: Payer: Self-pay | Admitting: *Deleted

## 2014-08-27 NOTE — Telephone Encounter (Signed)
OK to change manufacture.

## 2014-08-27 NOTE — Telephone Encounter (Signed)
[facility] has change manufactures for warfarin and they need ok to switch patient over

## 2014-09-14 ENCOUNTER — Other Ambulatory Visit: Payer: Self-pay | Admitting: Cardiology

## 2014-09-14 MED ORDER — FUROSEMIDE 40 MG PO TABS: 40.0000 mg | ORAL_TABLET | Freq: Every day | ORAL | Status: DC

## 2014-09-14 NOTE — Telephone Encounter (Signed)
Received fax refill request  Rx # [id number] Medication:  Furosemide 40 mg tab Qty 30 Sig:  Take one tablet by mouth once daily Physician:  Wyline MoodBranch

## 2014-10-07 ENCOUNTER — Ambulatory Visit (INDEPENDENT_AMBULATORY_CARE_PROVIDER_SITE_OTHER): Payer: Medicare Other | Admitting: *Deleted

## 2014-10-07 DIAGNOSIS — Z5181 Encounter for therapeutic drug level monitoring: Secondary | ICD-10-CM

## 2014-10-07 DIAGNOSIS — Z7901 Long term (current) use of anticoagulants: Secondary | ICD-10-CM

## 2014-10-07 DIAGNOSIS — I4891 Unspecified atrial fibrillation: Secondary | ICD-10-CM

## 2014-10-07 DIAGNOSIS — I480 Paroxysmal atrial fibrillation: Secondary | ICD-10-CM

## 2014-10-07 LAB — POCT INR: INR: 2.5

## 2014-10-12 ENCOUNTER — Encounter: Payer: Self-pay | Admitting: *Deleted

## 2014-10-12 ENCOUNTER — Encounter: Payer: Self-pay | Admitting: Cardiology

## 2014-10-12 NOTE — Progress Notes (Signed)
Clinical Summary [name] is a 81 y.o.male seen today for follow up of the following medical problems.   1. ICM/CAD  - prior CABG in 2003 at [facility]  - LVEF 15-20% by echo 12/2012  - titration of medications has been limited due to hypotension   - denies any significant chest pain. Denies any significant DOE, can walk a few miles on level ground, does have some DOE with going up hill. No orthopnea, no PND, no LE edema. Unchanged since last visit  - limiting sodium intake, avoiding NSAIDs  - compliant with meds: coreg, losartan, lasix. Titration limited by low normal blood pressures, mild orthostatic symptoms. Was on aspirin, had very heavy bruising and has been off x 2 years.   2. Chronic afib  - denies any palpitations  - on coumadin, denies any troubles with bleeding. Does have some easy bruising.   3. Complete heart block  - has biventricular pacmemaker, followed by Dr Ladona Ridgel, converted from BiV ICD to BiV pacemaker at last generator change.  - denies any significant lightheadedness or dizziness  - device check 05/2014 normal function, some NSVT episodes.   4. HL  - prava stopped last visit after developing a rash. .  - tried lovastatin last visit but this also caused rash, he has stopped taking.   Past Medical History  Diagnosis Date  . Hypertension   . Hypothyroid   . Chronic anticoagulation     continued for both PAF & possible apical mural thrombus.  . Arteriosclerotic cardiovascular disease (ASCVD) 2003    status post CABG surgery-2003 following AMI; ischemic cardiomyopathy with an EF of 25%; s/p AICD implantation-2007; h/o LBBB; possible apical thrombus   . Paroxysmal atrial fibrillation 2003    Subsequently permanent  . AICD (automatic cardioverter/defibrillator) present   . Hyperlipidemia   . Mitral regurgitation     Mild by echo in 08/2007 but moderate to severe in 09/2008  . Coronary artery disease   . CHF (congestive heart failure)   .  Other primary cardiomyopathies      Allergies  Allergen Reactions  . Statins Rash     Current Outpatient Prescriptions  Medication Sig Dispense Refill  . carvedilol (COREG) 6.25 MG tablet Take 1 tablet (6.25 mg total) by mouth daily. May be 6.25 patient will call back 90 tablet 3  . furosemide (LASIX) 40 MG tablet Take 1 tablet (40 mg total) by mouth daily. 30 tablet 11  . levothyroxine (SYNTHROID, LEVOTHROID) 112 MCG tablet Take 112 mcg by mouth daily.      Marland Kitchen losartan (COZAAR) 50 MG tablet Take 1 tablet (50 mg total) by mouth daily. 30 tablet 6  . Multiple Vitamin (MULTIVITAMIN) tablet Take 1 tablet by mouth daily. Take 1/2 tablet a day.    . warfarin (COUMADIN) 2 MG tablet TAKE ONE TABLET BY MOUTH ONCE DAILY EXCEPT ONE & ONE-HALF TAB DAILY ON SUNDAYS, TUESDAYS, AND THURSDAYS 45 tablet 6   No current facility-administered medications for this visit.     Past Surgical History  Procedure Laterality Date  . Coronary artery bypass graft  2003  . Cardiac defibrillator placement  2007  . Insert / replace / remove pacemaker    . Bi-ventricular pacemaker insertion N/A 05/01/2012    Procedure: BI-VENTRICULAR PACEMAKER INSERTION (CRT-P);  Surgeon: Marinus Maw, MD;  Location: [facility] CATH LAB;  Service: Cardiovascular;  Laterality: N/A;     Allergies  Allergen Reactions  . Statins Rash  No family history on file.   Social History Mr. Cedric FishmanSharpe reports that he has never smoked. He does not have any smokeless tobacco history on file. Mr. Cedric FishmanSharpe reports that he does not drink alcohol.   Review of Systems CONSTITUTIONAL: No weight loss, fever, chills, weakness or fatigue.  HEENT: Eyes: No visual loss, blurred vision, double vision or yellow sclerae.No hearing loss, sneezing, congestion, runny nose or sore throat.  SKIN: No rash or itching.  CARDIOVASCULAR:  RESPIRATORY: No shortness of breath, cough or sputum.  GASTROINTESTINAL: No anorexia, nausea, vomiting or diarrhea. No  abdominal pain or blood.  GENITOURINARY: No burning on urination, no polyuria NEUROLOGICAL: No headache, dizziness, syncope, paralysis, ataxia, numbness or tingling in the extremities. No change in bowel or bladder control.  MUSCULOSKELETAL: No muscle, back pain, joint pain or stiffness.  LYMPHATICS: No enlarged nodes. No history of splenectomy.  PSYCHIATRIC: No history of depression or anxiety.  ENDOCRINOLOGIC: No reports of sweating, cold or heat intolerance. No polyuria or polydipsia.  Marland Kitchen.   Physical Examination There were no vitals filed for this visit. There were no vitals filed for this visit.  Gen: resting comfortably, no acute distress HEENT: no scleral icterus, pupils equal round and reactive, no palptable cervical adenopathy,  CV Resp: Clear to auscultation bilaterally GI: abdomen is soft, non-tender, non-distended, normal bowel sounds, no hepatosplenomegaly MSK: extremities are warm, no edema.  Skin: warm, no rash Neuro:  no focal deficits Psych: appropriate affect   Diagnostic Studies 12/2012 Echo: LVEF 15-20%, mild LVH, multiple WMAs, mild MR, PASP 31    Assessment and Plan  1. ICM/CAD  - LVEF 15-20%, NYHA II, euvolemic in clinic today  - titration of medications limited by low blood pressures and orthostatic symptoms  - ICD changed to BiV pacemaker previously, he is followed by EP  - continue current meds. Not on ASA because of heavy bruising while also on coumadin   2. Chronic afib  - denies any symptoms  - continue rate control and coumadin for anticoagulation   3. Complete heart block  - BiV pacemaker followed by EP, continue regular visits  - denies any symptoms   4. Hyperlipidemia  - developed rash to both prava and lova, statins stopped.        Antoine PocheJonathan F. Kyrie Bun, M.D., F.A.C.C.

## 2014-10-26 ENCOUNTER — Encounter: Payer: Self-pay | Admitting: *Deleted

## 2014-10-26 ENCOUNTER — Encounter: Payer: Self-pay | Admitting: Adult Health

## 2014-10-26 NOTE — Progress Notes (Signed)
Cardiology Office Note   NO SHOW  

## 2014-11-11 ENCOUNTER — Ambulatory Visit: Payer: Self-pay | Admitting: Cardiology

## 2014-11-18 ENCOUNTER — Ambulatory Visit (INDEPENDENT_AMBULATORY_CARE_PROVIDER_SITE_OTHER): Payer: Medicare Other | Admitting: *Deleted

## 2014-11-18 DIAGNOSIS — I4891 Unspecified atrial fibrillation: Secondary | ICD-10-CM

## 2014-11-18 DIAGNOSIS — Z7901 Long term (current) use of anticoagulants: Secondary | ICD-10-CM

## 2014-11-18 DIAGNOSIS — I480 Paroxysmal atrial fibrillation: Secondary | ICD-10-CM

## 2014-11-18 DIAGNOSIS — Z5181 Encounter for therapeutic drug level monitoring: Secondary | ICD-10-CM

## 2014-11-18 LAB — POCT INR: INR: 2.2

## 2014-11-23 ENCOUNTER — Other Ambulatory Visit: Payer: Self-pay | Admitting: Cardiovascular Disease

## 2014-11-23 MED ORDER — LOSARTAN POTASSIUM 50 MG PO TABS: 50.0000 mg | ORAL_TABLET | Freq: Every day | ORAL | Status: DC

## 2014-11-23 NOTE — Telephone Encounter (Signed)
Received fax refill request  Rx # [id number] Medication:  Losartan 50 mg tab Qty 30 Sig:  Take one tablet by mouth once daily Physician:  Purvis SheffieldKoneswaran

## 2014-11-23 NOTE — Telephone Encounter (Signed)
Branch pt escribed refill

## 2014-11-27 ENCOUNTER — Ambulatory Visit (INDEPENDENT_AMBULATORY_CARE_PROVIDER_SITE_OTHER): Payer: Medicare Other | Admitting: *Deleted

## 2014-11-27 DIAGNOSIS — I482 Chronic atrial fibrillation, unspecified: Secondary | ICD-10-CM

## 2014-11-27 LAB — MDC_IDC_ENUM_SESS_TYPE_INCLINIC
Battery Remaining Longevity: 72 mo
Brady Statistic RV Percent Paced: 99 %
Date Time Interrogation Session: 20160325040000
Lead Channel Impedance Value: 624 Ohm
Lead Channel Impedance Value: 660 Ohm
Lead Channel Pacing Threshold Amplitude: 0.7 V
Lead Channel Pacing Threshold Amplitude: 0.7 V
Lead Channel Pacing Threshold Pulse Width: 0.4 ms
Lead Channel Pacing Threshold Pulse Width: 0.4 ms
Lead Channel Sensing Intrinsic Amplitude: 0.5 mV
Lead Channel Sensing Intrinsic Amplitude: 25 mV
Lead Channel Sensing Intrinsic Amplitude: 7.9 mV
Lead Channel Setting Pacing Amplitude: 2.4 V
Lead Channel Setting Pacing Pulse Width: 0.4 ms
Lead Channel Setting Pacing Pulse Width: 0.4 ms
Lead Channel Setting Sensing Sensitivity: 2.5 mV
MDC IDC MSMT LEADCHNL RV IMPEDANCE VALUE: 489 Ohm
MDC IDC PG SERIAL: 100087
MDC IDC SET LEADCHNL LV SENSING SENSITIVITY: 2.5 mV
MDC IDC SET LEADCHNL RV PACING AMPLITUDE: 2 V
Zone Setting Detection Interval: 375 ms

## 2014-11-27 NOTE — Progress Notes (Signed)
PPM check in office. 

## 2014-12-30 ENCOUNTER — Ambulatory Visit (INDEPENDENT_AMBULATORY_CARE_PROVIDER_SITE_OTHER): Payer: Medicare Other | Admitting: *Deleted

## 2014-12-30 ENCOUNTER — Encounter: Payer: Self-pay | Admitting: Internal Medicine

## 2014-12-30 DIAGNOSIS — I480 Paroxysmal atrial fibrillation: Secondary | ICD-10-CM | POA: Diagnosis not present

## 2014-12-30 DIAGNOSIS — Z7901 Long term (current) use of anticoagulants: Secondary | ICD-10-CM

## 2014-12-30 DIAGNOSIS — Z5181 Encounter for therapeutic drug level monitoring: Secondary | ICD-10-CM

## 2014-12-30 DIAGNOSIS — I4891 Unspecified atrial fibrillation: Secondary | ICD-10-CM | POA: Diagnosis not present

## 2014-12-30 LAB — POCT INR: INR: 2.2

## 2015-02-10 ENCOUNTER — Ambulatory Visit (INDEPENDENT_AMBULATORY_CARE_PROVIDER_SITE_OTHER): Payer: Medicare Other | Admitting: *Deleted

## 2015-02-10 DIAGNOSIS — Z7901 Long term (current) use of anticoagulants: Secondary | ICD-10-CM | POA: Diagnosis not present

## 2015-02-10 DIAGNOSIS — I480 Paroxysmal atrial fibrillation: Secondary | ICD-10-CM

## 2015-02-10 DIAGNOSIS — I4891 Unspecified atrial fibrillation: Secondary | ICD-10-CM

## 2015-02-10 DIAGNOSIS — Z5181 Encounter for therapeutic drug level monitoring: Secondary | ICD-10-CM | POA: Diagnosis not present

## 2015-02-10 LAB — POCT INR: INR: 2.7

## 2015-03-24 ENCOUNTER — Ambulatory Visit (INDEPENDENT_AMBULATORY_CARE_PROVIDER_SITE_OTHER): Payer: Medicare Other | Admitting: *Deleted

## 2015-03-24 DIAGNOSIS — Z7901 Long term (current) use of anticoagulants: Secondary | ICD-10-CM | POA: Diagnosis not present

## 2015-03-24 DIAGNOSIS — I4891 Unspecified atrial fibrillation: Secondary | ICD-10-CM

## 2015-03-24 DIAGNOSIS — I480 Paroxysmal atrial fibrillation: Secondary | ICD-10-CM

## 2015-03-24 DIAGNOSIS — Z5181 Encounter for therapeutic drug level monitoring: Secondary | ICD-10-CM | POA: Diagnosis not present

## 2015-03-24 LAB — POCT INR: INR: 2

## 2015-04-19 ENCOUNTER — Other Ambulatory Visit: Payer: Self-pay | Admitting: *Deleted

## 2015-04-19 MED ORDER — WARFARIN SODIUM 2 MG PO TABS: ORAL_TABLET | ORAL | Status: DC

## 2015-04-26 ENCOUNTER — Other Ambulatory Visit: Payer: Self-pay | Admitting: *Deleted

## 2015-04-26 MED ORDER — CARVEDILOL 6.25 MG PO TABS: 6.2500 mg | ORAL_TABLET | Freq: Every day | ORAL | Status: DC

## 2015-05-06 ENCOUNTER — Encounter: Payer: Self-pay | Admitting: Internal Medicine

## 2015-05-06 ENCOUNTER — Ambulatory Visit (INDEPENDENT_AMBULATORY_CARE_PROVIDER_SITE_OTHER): Payer: Medicare Other | Admitting: Internal Medicine

## 2015-05-06 ENCOUNTER — Encounter: Payer: Self-pay | Admitting: *Deleted

## 2015-05-06 VITALS — BP 128/78 | HR 71 | Ht 71.0 in | Wt 174.0 lb

## 2015-05-06 DIAGNOSIS — I5022 Chronic systolic (congestive) heart failure: Secondary | ICD-10-CM | POA: Diagnosis not present

## 2015-05-06 DIAGNOSIS — I482 Chronic atrial fibrillation, unspecified: Secondary | ICD-10-CM

## 2015-05-06 DIAGNOSIS — I10 Essential (primary) hypertension: Secondary | ICD-10-CM

## 2015-05-06 DIAGNOSIS — Z950 Presence of cardiac pacemaker: Secondary | ICD-10-CM

## 2015-05-06 LAB — CUP PACEART INCLINIC DEVICE CHECK
Battery Remaining Longevity: 5.5
Date Time Interrogation Session: 20160901040000
Lead Channel Impedance Value: 641 Ohm
Lead Channel Impedance Value: 658 Ohm
Lead Channel Pacing Threshold Amplitude: 0.8 V
Lead Channel Pacing Threshold Pulse Width: 0.4 ms
Lead Channel Setting Pacing Amplitude: 2 V
Lead Channel Setting Sensing Sensitivity: 2.5 mV
Lead Channel Setting Sensing Sensitivity: 2.5 mV
MDC IDC MSMT LEADCHNL LV PACING THRESHOLD AMPLITUDE: 0.7 V
MDC IDC MSMT LEADCHNL LV PACING THRESHOLD PULSEWIDTH: 0.4 ms
MDC IDC MSMT LEADCHNL RA SENSING INTR AMPL: 1.3 mV
MDC IDC MSMT LEADCHNL RV IMPEDANCE VALUE: 487 Ohm
MDC IDC PG SERIAL: 100087
MDC IDC SET LEADCHNL LV PACING AMPLITUDE: 2.4 V
MDC IDC SET LEADCHNL LV PACING PULSEWIDTH: 0.4 ms
MDC IDC SET LEADCHNL RV PACING PULSEWIDTH: 0.4 ms
MDC IDC SET ZONE DETECTION INTERVAL: 375 ms
MDC IDC STAT BRADY RA PERCENT PACED: 0 %
MDC IDC STAT BRADY RV PERCENT PACED: 98 %

## 2015-05-06 MED ORDER — LOSARTAN POTASSIUM 50 MG PO TABS: 50.0000 mg | ORAL_TABLET | Freq: Every day | ORAL | Status: DC

## 2015-05-06 MED ORDER — FUROSEMIDE 40 MG PO TABS: 40.0000 mg | ORAL_TABLET | Freq: Every day | ORAL | Status: DC

## 2015-05-06 MED ORDER — WARFARIN SODIUM 2 MG PO TABS: ORAL_TABLET | ORAL | Status: DC

## 2015-05-06 NOTE — Patient Instructions (Addendum)
Your physician wants you to follow-up in: 12 months with Dr. Ladona Ridgel. You will receive a reminder letter in the mail two months in advance. If you don't receive a letter, please call our office to schedule the follow-up appointment.  Your physician wants you to follow-up in: 6 months with the Device Clinic. You will receive a reminder letter in the mail two months in advance. If you don't receive a letter, please call our office to schedule the follow-up appointment.  You have been given an application for Disability parking placecard.   Your physician recommends that you continue on your current medications as directed. Please refer to the Current Medication list given to you today.  Thank you for choosing [facility]!

## 2015-05-06 NOTE — Assessment & Plan Note (Signed)
His ventricular rate is well controlled. No change in meds. He is chronically in atrial fib.

## 2015-05-06 NOTE — Assessment & Plan Note (Signed)
His symptoms remain class 2. He will continue his current meds. 

## 2015-05-06 NOTE — Progress Notes (Signed)
HPI [name] returns today for followup. He is a very pleasant 81 year old man with a long-standing ischemic cardiomyopathy, chronic atrial fibrillation, complete heart block, status post ICD implantation. After his device reached elective replacement, he was downgraded to a a biventricular pacemaker, and continues to do quite well. He denies chest pain, shortness of breath, or syncope. No peripheral edema. He remains active making [name].  Allergies  Allergen Reactions  . Statins Rash     Current Outpatient Prescriptions  Medication Sig Dispense Refill  . carvedilol (COREG) 6.25 MG tablet Take 1 tablet (6.25 mg total) by mouth daily. May be 6.25 patient will call back 90 tablet 0  . furosemide (LASIX) 40 MG tablet Take 1 tablet (40 mg total) by mouth daily. 30 tablet 11  . levothyroxine (SYNTHROID, LEVOTHROID) 112 MCG tablet Take 112 mcg by mouth daily.      Marland Kitchen losartan (COZAAR) 50 MG tablet Take 1 tablet (50 mg total) by mouth daily. 30 tablet 6  . Multiple Vitamin (MULTIVITAMIN) tablet Take 1 tablet by mouth daily. Take 1/2 tablet a day.    . warfarin (COUMADIN) 2 MG tablet TAKE ONE TABLET BY MOUTH ONCE DAILY EXCEPT ONE & ONE-HALF TAB DAILY ON SUNDAYS, TUESDAYS, AND THURSDAYS 45 tablet 6   No current facility-administered medications for this visit.     Past Medical History  Diagnosis Date  . Hypertension   . Hypothyroid   . Chronic anticoagulation     continued for both PAF & possible apical mural thrombus.  . Arteriosclerotic cardiovascular disease (ASCVD) 2003    status post CABG surgery-2003 following AMI; ischemic cardiomyopathy with an EF of 25%; s/p AICD implantation-2007; h/o LBBB; possible apical thrombus   . Paroxysmal atrial fibrillation 2003    Subsequently permanent  . AICD (automatic cardioverter/defibrillator) present   . Hyperlipidemia   . Mitral regurgitation     Mild by echo in 08/2007 but moderate to severe in 09/2008  . Coronary artery disease   . CHF  (congestive heart failure)   . Other primary cardiomyopathies     ROS:   All systems reviewed and negative except as noted in the HPI.   Past Surgical History  Procedure Laterality Date  . Coronary artery bypass graft  2003  . Cardiac defibrillator placement  2007  . Insert / replace / remove pacemaker    . Bi-ventricular pacemaker insertion N/A 05/01/2012    Procedure: BI-VENTRICULAR PACEMAKER INSERTION (CRT-P);  Surgeon: Marinus Maw, MD;  Location: [facility] CATH LAB;  Service: Cardiovascular;  Laterality: N/A;     No family history on file.   Social History   Social History  . Marital Status: Married    Spouse Name: N/A  . Number of Children: N/A  . Years of Education: N/A   Occupational History  . Not on file.   Social History Main Topics  . Smoking status: Never Smoker   . Smokeless tobacco: Never Used  . Alcohol Use: No  . Drug Use: No  . Sexual Activity: Not on file   Other Topics Concern  . Not on file   Social History Narrative     BP 128/78 mmHg  Pulse 71  Ht  (1.803 m)  Wt 174 lb (78.926 kg)  BMI 24.28 kg/m2  SpO2 98%  Physical Exam:  Well appearing elderly man, NAD HEENT: Unremarkable Neck:  7 cm JVD, no thyromegally Back:  No CVA tenderness Lungs:  Clear with no wheezes, rales, or rhonchi. HEART:  Regular rate rhythm, no murmurs, no rubs, no clicks Abd:  soft, positive bowel sounds, no organomegally, no rebound, no guarding Ext:  2 plus pulses, no edema, no cyanosis, no clubbing Skin:  No rashes no nodules Neuro:  CN II through XII intact, motor grossly intact  DEVICE  Normal device function.  See PaceArt for details.   Assess/Plan:

## 2015-05-06 NOTE — Addendum Note (Signed)
Addended by: Kerney Elbe on: 05/06/2015 01:36 PM   Modules accepted: Orders

## 2015-05-06 NOTE — Assessment & Plan Note (Signed)
His [employer] device is working normally. No programming changes made. Will recheck in several months.

## 2015-05-19 ENCOUNTER — Encounter: Payer: Self-pay | Admitting: Internal Medicine

## 2015-07-07 ENCOUNTER — Ambulatory Visit (INDEPENDENT_AMBULATORY_CARE_PROVIDER_SITE_OTHER): Payer: Medicare Other | Admitting: *Deleted

## 2015-07-07 DIAGNOSIS — I480 Paroxysmal atrial fibrillation: Secondary | ICD-10-CM | POA: Diagnosis not present

## 2015-07-07 DIAGNOSIS — I4891 Unspecified atrial fibrillation: Secondary | ICD-10-CM

## 2015-07-07 DIAGNOSIS — Z7901 Long term (current) use of anticoagulants: Secondary | ICD-10-CM

## 2015-07-07 DIAGNOSIS — Z5181 Encounter for therapeutic drug level monitoring: Secondary | ICD-10-CM

## 2015-07-07 LAB — POCT INR: INR: 2.6

## 2015-08-20 ENCOUNTER — Other Ambulatory Visit: Payer: Self-pay | Admitting: Cardiology

## 2015-08-20 NOTE — Telephone Encounter (Signed)
Long overdue for apt with Dr Adline MangoBranch,told in August make apt,none made 2 week supply given

## 2015-08-24 ENCOUNTER — Other Ambulatory Visit: Payer: Self-pay | Admitting: Cardiology

## 2015-09-02 ENCOUNTER — Other Ambulatory Visit: Payer: Self-pay | Admitting: *Deleted

## 2015-09-23 ENCOUNTER — Other Ambulatory Visit: Payer: Self-pay | Admitting: Cardiology

## 2015-10-11 ENCOUNTER — Ambulatory Visit: Payer: Medicare Other | Admitting: Cardiology

## 2015-10-14 ENCOUNTER — Ambulatory Visit (INDEPENDENT_AMBULATORY_CARE_PROVIDER_SITE_OTHER): Payer: Medicare Other | Admitting: Cardiology

## 2015-10-14 ENCOUNTER — Encounter: Payer: Self-pay | Admitting: Cardiology

## 2015-10-14 ENCOUNTER — Ambulatory Visit (INDEPENDENT_AMBULATORY_CARE_PROVIDER_SITE_OTHER): Payer: Medicare Other | Admitting: *Deleted

## 2015-10-14 VITALS — BP 116/68 | HR 73 | Ht 72.0 in | Wt 174.0 lb

## 2015-10-14 DIAGNOSIS — I5022 Chronic systolic (congestive) heart failure: Secondary | ICD-10-CM | POA: Diagnosis not present

## 2015-10-14 DIAGNOSIS — I442 Atrioventricular block, complete: Secondary | ICD-10-CM

## 2015-10-14 DIAGNOSIS — I4891 Unspecified atrial fibrillation: Secondary | ICD-10-CM

## 2015-10-14 DIAGNOSIS — Z7901 Long term (current) use of anticoagulants: Secondary | ICD-10-CM

## 2015-10-14 DIAGNOSIS — Z5181 Encounter for therapeutic drug level monitoring: Secondary | ICD-10-CM

## 2015-10-14 LAB — POCT INR: INR: 2.7

## 2015-10-14 MED ORDER — LOSARTAN POTASSIUM 50 MG PO TABS: 50.0000 mg | ORAL_TABLET | Freq: Every day | ORAL | Status: DC

## 2015-10-14 MED ORDER — CARVEDILOL 3.125 MG PO TABS: 3.1250 mg | ORAL_TABLET | Freq: Two times a day (BID) | ORAL | Status: AC

## 2015-10-14 MED ORDER — FUROSEMIDE 40 MG PO TABS: 40.0000 mg | ORAL_TABLET | Freq: Every day | ORAL | Status: DC

## 2015-10-14 NOTE — Patient Instructions (Addendum)
Your physician wants you to follow-up in: 6 months You will receive a reminder letter in the mail two months in advance. If you don't receive a letter, please call our office to schedule the follow-up appointment.    DECREASE Coreg to 3.125 mg twice a day     Thank you for choosing [facility] !

## 2015-10-14 NOTE — Progress Notes (Signed)
Patient ID: [name], male   DOB: [birth year], 82 y.o.   MRN: [id number]     Clinical Summary [name] is a 82 y.o.male seen today for follow up of the following medical problems.   1. ICM/CAD  - prior CABG in 2003 at [facility]  - LVEF 15-20% by echo 12/2012  - titration of medications has been limited due to hypotension    - denies any chest pain, no SOB or DOE - limiting sodium intake, compliant with meds though only taking coreg once daily - exercise tolerance has increased since visit.      2. Chronic afib  - denies any palpitations - denies any bleeding on coumadin  3. Complete heart block  - has biventricular pacmemaker, followed by Dr Ladona Ridgel, converted from BiV ICD to BiV pacemaker at last generator change.   - denies any significant lightheadedness or dizziness  - device check 05/2015 normal function,  4. HL  - prava stopped last visit after developing a rash. .  - tried lovastatin last visit but this also caused rash, he has stopped taking.  Past Medical History  Diagnosis Date  . Hypertension   . Hypothyroid   . Chronic anticoagulation     continued for both PAF & possible apical mural thrombus.  . Arteriosclerotic cardiovascular disease (ASCVD) 2003    status post CABG surgery-2003 following AMI; ischemic cardiomyopathy with an EF of 25%; s/p AICD implantation-2007; h/o LBBB; possible apical thrombus   . Paroxysmal atrial fibrillation 2003    Subsequently permanent  . AICD (automatic cardioverter/defibrillator) present   . Hyperlipidemia   . Mitral regurgitation     Mild by echo in 08/2007 but moderate to severe in 09/2008  . Coronary artery disease   . CHF (congestive heart failure)   . Other primary cardiomyopathies      Allergies  Allergen Reactions  . Statins Rash     Current Outpatient Prescriptions  Medication Sig Dispense Refill  . carvedilol (COREG) 6.25 MG tablet TAKE ONE TABLET BY MOUTH ONCE DAILY ** NEEDS APPOINTMENT  FOR FURTHER REFILLS ** 28 tablet 3  . furosemide (LASIX) 40 MG tablet Take 1 tablet (40 mg total) by mouth daily. 90 tablet 11  . levothyroxine (SYNTHROID, LEVOTHROID) 112 MCG tablet Take 112 mcg by mouth daily.      Marland Kitchen losartan (COZAAR) 50 MG tablet Take 1 tablet (50 mg total) by mouth daily. 90 tablet 3  . Multiple Vitamin (MULTIVITAMIN) tablet Take 1 tablet by mouth daily. Take 1/2 tablet a day.    . warfarin (COUMADIN) 2 MG tablet TAKE ONE TABLET BY MOUTH ONCE DAILY EXCEPT ONE & ONE-HALF TAB DAILY ON SUNDAYS, TUESDAYS, AND THURSDAYS 90 tablet 3   No current facility-administered medications for this visit.     Past Surgical History  Procedure Laterality Date  . Coronary artery bypass graft  2003  . Cardiac defibrillator placement  2007  . Insert / replace / remove pacemaker    . Bi-ventricular pacemaker insertion N/A 05/01/2012    Procedure: BI-VENTRICULAR PACEMAKER INSERTION (CRT-P);  Surgeon: Marinus Maw, MD;  Location: [facility] CATH LAB;  Service: Cardiovascular;  Laterality: N/A;     Allergies  Allergen Reactions  . Statins Rash      Family history Denies significant family history of heart disease   Social History Mr. Warrell reports that he has never smoked. He has never used smokeless tobacco. Mr. Tolson reports that he does not drink alcohol.   Review of  Systems CONSTITUTIONAL: No weight loss, fever, chills, weakness or fatigue.  HEENT: Eyes: No visual loss, blurred vision, double vision or yellow sclerae.No hearing loss, sneezing, congestion, runny nose or sore throat.  SKIN: No rash or itching.  CARDIOVASCULAR: RRR, no m/r/g, no jvd RESPIRATORY: No shortness of breath, cough or sputum.  GASTROINTESTINAL: No anorexia, nausea, vomiting or diarrhea. No abdominal pain or blood.  GENITOURINARY: No burning on urination, no polyuria NEUROLOGICAL: No headache, dizziness, syncope, paralysis, ataxia, numbness or tingling in the extremities. No change in bowel or bladder  control.  MUSCULOSKELETAL: No muscle, back pain, joint pain or stiffness.  LYMPHATICS: No enlarged nodes. No history of splenectomy.  PSYCHIATRIC: No history of depression or anxiety.  ENDOCRINOLOGIC: No reports of sweating, cold or heat intolerance. No polyuria or polydipsia.  Marland Kitchen   Physical Examination Filed Vitals:   10/14/15 1400  BP: 116/68  Pulse: 73   Filed Vitals:   10/14/15 1400  Height: 6' (1.829 m)  Weight: 174 lb (78.926 kg)    Gen: resting comfortably, no acute distress HEENT: no scleral icterus, pupils equal round and reactive, no palptable cervical adenopathy,  CV: RRR, no m/r/g, no jvd Resp: Clear to auscultation bilaterally GI: abdomen is soft, non-tender, non-distended, normal bowel sounds, no hepatosplenomegaly MSK: extremities are warm, no edema.  Skin: warm, no rash Neuro:  no focal deficits Psych: appropriate affect   Diagnostic Studies 12/2012 Echo: LVEF 15-20%, mild LVH, multiple WMAs, mild MR, PASP 31    Assessment and Plan  1. ICM/CAD  - LVEF 15-20%, NYHA II, euvolemic in clinic today  - titration of medications limited by low blood pressures and orthostatic symptoms  - ICD changed to BiV pacemaker previously, he is followed by EP  - he is taking coreg 6.25mg  once daily, we will change to 3.125mg  bid.   2. Chronic afib  - denies any symptoms  - continue current meds  3. Complete heart block  - BiV pacemaker followed by EP, continue regular visits  - denies any symptoms   4. Hyperlipidemia  - developed rash to both prava and lova, statins stopped.  - continue dietary therapy    F/u 6 months    Antoine Poche, M.D.

## 2015-11-24 ENCOUNTER — Ambulatory Visit (INDEPENDENT_AMBULATORY_CARE_PROVIDER_SITE_OTHER): Payer: Medicare Other | Admitting: *Deleted

## 2015-11-24 DIAGNOSIS — I4891 Unspecified atrial fibrillation: Secondary | ICD-10-CM

## 2015-11-24 DIAGNOSIS — Z5181 Encounter for therapeutic drug level monitoring: Secondary | ICD-10-CM | POA: Diagnosis not present

## 2015-11-24 LAB — POCT INR: INR: 2

## 2015-12-15 ENCOUNTER — Ambulatory Visit (INDEPENDENT_AMBULATORY_CARE_PROVIDER_SITE_OTHER): Payer: Medicare Other | Admitting: *Deleted

## 2015-12-15 ENCOUNTER — Encounter: Payer: Self-pay | Admitting: Internal Medicine

## 2015-12-15 DIAGNOSIS — I5022 Chronic systolic (congestive) heart failure: Secondary | ICD-10-CM | POA: Diagnosis not present

## 2015-12-15 DIAGNOSIS — I442 Atrioventricular block, complete: Secondary | ICD-10-CM

## 2015-12-15 DIAGNOSIS — I482 Chronic atrial fibrillation, unspecified: Secondary | ICD-10-CM

## 2015-12-15 LAB — CUP PACEART INCLINIC DEVICE CHECK
Brady Statistic RA Percent Paced: 0 %
Brady Statistic RV Percent Paced: 97 %
Date Time Interrogation Session: 20170412162426
Implantable Lead Implant Date: 20070530
Implantable Lead Implant Date: 20070530
Implantable Lead Model: 4087
Lead Channel Impedance Value: 478 Ohm
Lead Channel Pacing Threshold Pulse Width: 0.4 ms
Lead Channel Sensing Intrinsic Amplitude: 2.1 mV
Lead Channel Setting Pacing Pulse Width: 0.4 ms
Lead Channel Setting Sensing Sensitivity: 2.5 mV
Lead Channel Setting Sensing Sensitivity: 2.5 mV
MDC IDC LEAD IMPLANT DT: 20070530
MDC IDC LEAD LOCATION: 753858
MDC IDC LEAD LOCATION: 753859
MDC IDC LEAD LOCATION: 753860
MDC IDC LEAD MODEL: 158
MDC IDC LEAD SERIAL: 166565
MDC IDC LEAD SERIAL: 271301
MDC IDC MSMT BATTERY REMAINING LONGEVITY: 60 mo
MDC IDC MSMT LEADCHNL LV IMPEDANCE VALUE: 628 Ohm
MDC IDC MSMT LEADCHNL LV PACING THRESHOLD AMPLITUDE: 0.7 V
MDC IDC MSMT LEADCHNL LV SENSING INTR AMPL: 6.5 mV
MDC IDC MSMT LEADCHNL RA IMPEDANCE VALUE: 624 Ohm
MDC IDC MSMT LEADCHNL RV PACING THRESHOLD AMPLITUDE: 0.8 V
MDC IDC MSMT LEADCHNL RV PACING THRESHOLD PULSEWIDTH: 0.4 ms
MDC IDC MSMT LEADCHNL RV SENSING INTR AMPL: 25 mV
MDC IDC SET LEADCHNL LV PACING AMPLITUDE: 2.4 V
MDC IDC SET LEADCHNL LV PACING PULSEWIDTH: 0.4 ms
MDC IDC SET LEADCHNL RV PACING AMPLITUDE: 2 V
Pulse Gen Serial Number: 100087

## 2015-12-15 NOTE — Progress Notes (Signed)
CRT-P device check in clinic. Normal device function. Thresholds, sensing, impedance consistent with previous measurements. Histograms appropriate for patient and level of activity. Permanent AF + warfarin. No ventricular high rate episodes. Patient bi-ventricularly pacing 97% of the time. Device programmed with appropriate safety margins. Estimated longevity 5 years. Patient will follow up with GT/R in 05-2016.

## 2016-01-05 ENCOUNTER — Ambulatory Visit (INDEPENDENT_AMBULATORY_CARE_PROVIDER_SITE_OTHER): Payer: Medicare Other | Admitting: *Deleted

## 2016-01-05 DIAGNOSIS — Z5181 Encounter for therapeutic drug level monitoring: Secondary | ICD-10-CM

## 2016-01-05 DIAGNOSIS — I4891 Unspecified atrial fibrillation: Secondary | ICD-10-CM | POA: Diagnosis not present

## 2016-01-05 LAB — POCT INR: INR: 2.1

## 2016-02-16 ENCOUNTER — Encounter: Payer: Self-pay | Admitting: *Deleted

## 2016-03-15 ENCOUNTER — Ambulatory Visit (INDEPENDENT_AMBULATORY_CARE_PROVIDER_SITE_OTHER): Payer: Medicare Other | Admitting: Pharmacist

## 2016-03-15 DIAGNOSIS — Z5181 Encounter for therapeutic drug level monitoring: Secondary | ICD-10-CM

## 2016-03-15 DIAGNOSIS — I4891 Unspecified atrial fibrillation: Secondary | ICD-10-CM | POA: Diagnosis not present

## 2016-03-15 LAB — POCT INR: INR: 2

## 2016-03-31 ENCOUNTER — Other Ambulatory Visit (HOSPITAL_COMMUNITY): Payer: Self-pay | Admitting: Internal Medicine

## 2016-03-31 DIAGNOSIS — R944 Abnormal results of kidney function studies: Secondary | ICD-10-CM

## 2016-04-12 ENCOUNTER — Telehealth: Payer: Self-pay | Admitting: *Deleted

## 2016-04-12 ENCOUNTER — Other Ambulatory Visit: Payer: Self-pay | Admitting: *Deleted

## 2016-04-12 MED ORDER — WARFARIN SODIUM 2 MG PO TABS: ORAL_TABLET | ORAL | 3 refills | Status: DC

## 2016-04-13 ENCOUNTER — Ambulatory Visit (HOSPITAL_COMMUNITY)
Admission: RE | Admit: 2016-04-13 | Discharge: 2016-04-13 | Disposition: A | Discharge status: Home / Self Care | Payer: Medicare Other | Source: Ambulatory Visit | Attending: Internal Medicine | Admitting: Internal Medicine

## 2016-04-13 DIAGNOSIS — R944 Abnormal results of kidney function studies: Secondary | ICD-10-CM | POA: Diagnosis present

## 2016-04-13 DIAGNOSIS — N281 Cyst of kidney, acquired: Secondary | ICD-10-CM | POA: Diagnosis not present

## 2016-04-26 ENCOUNTER — Encounter: Payer: Self-pay | Admitting: *Deleted

## 2016-05-26 ENCOUNTER — Encounter: Payer: Self-pay | Admitting: Internal Medicine

## 2016-05-26 ENCOUNTER — Ambulatory Visit (INDEPENDENT_AMBULATORY_CARE_PROVIDER_SITE_OTHER): Payer: Medicare Other | Admitting: Internal Medicine

## 2016-05-26 DIAGNOSIS — I482 Chronic atrial fibrillation, unspecified: Secondary | ICD-10-CM

## 2016-05-26 DIAGNOSIS — I5022 Chronic systolic (congestive) heart failure: Secondary | ICD-10-CM | POA: Diagnosis not present

## 2016-05-26 LAB — CUP PACEART INCLINIC DEVICE CHECK
Implantable Lead Implant Date: 20070530
Implantable Lead Location: 753859
Implantable Lead Serial Number: 271301
Lead Channel Impedance Value: 491 Ohm
Lead Channel Impedance Value: 613 Ohm
Lead Channel Impedance Value: 626 Ohm
Lead Channel Pacing Threshold Amplitude: 0.7 V
Lead Channel Pacing Threshold Pulse Width: 0.4 ms
Lead Channel Setting Pacing Amplitude: 2 V
Lead Channel Setting Pacing Pulse Width: 0.4 ms
Lead Channel Setting Sensing Sensitivity: 2.5 mV
MDC IDC LEAD IMPLANT DT: 20070530
MDC IDC LEAD IMPLANT DT: 20070530
MDC IDC LEAD LOCATION: 753858
MDC IDC LEAD LOCATION: 753860
MDC IDC LEAD MODEL: 158
MDC IDC LEAD MODEL: 4087
MDC IDC LEAD SERIAL: 166565
MDC IDC MSMT LEADCHNL RA SENSING INTR AMPL: 1 mV
MDC IDC MSMT LEADCHNL RV PACING THRESHOLD AMPLITUDE: 0.8 V
MDC IDC MSMT LEADCHNL RV PACING THRESHOLD PULSEWIDTH: 0.4 ms
MDC IDC PG SERIAL: 100087
MDC IDC SESS DTM: 20170922040000
MDC IDC SET LEADCHNL LV PACING AMPLITUDE: 2.4 V
MDC IDC SET LEADCHNL RV PACING PULSEWIDTH: 0.4 ms
MDC IDC SET LEADCHNL RV SENSING SENSITIVITY: 2.5 mV

## 2016-05-26 NOTE — Patient Instructions (Addendum)
Your physician wants you to follow-up in: 6 Months with Dr. Ladona Ridgelaylor. You will receive a reminder letter in the mail two months in advance. If you don't receive a letter, please call our office to schedule the follow-up appointment.  Your physician recommends that you schedule a follow-up appointment in: 6 Months with the Device Clinic   Your physician recommends that you continue on your current medications as directed. Please refer to the Current Medication list given to you today.  If you need a refill on your cardiac medications before your next appointment, please call your pharmacy.  Thank you for choosing [facility]!

## 2016-05-26 NOTE — Progress Notes (Signed)
HPI [name] returns today for followup. He is a very pleasant 82 year old man with a long-standing ischemic cardiomyopathy, chronic atrial fibrillation, complete heart block, status post ICD implantation. After his device reached elective replacement, he was downgraded to a a biventricular pacemaker, and continues to do quite well. He denies chest pain, shortness of breath, or syncope. No peripheral edema. He remains active making [name]. He does have some chronic renal insufficiency requiring removal of his ARB. Allergies  Allergen Reactions  . Statins Rash     Current Outpatient Prescriptions  Medication Sig Dispense Refill  . carvedilol (COREG) 3.125 MG tablet Take 1 tablet (3.125 mg total) by mouth 2 (two) times daily. 180 tablet 3  . furosemide (LASIX) 40 MG tablet Take 1 tablet (40 mg total) by mouth daily. (Patient taking differently: Take 20 mg by mouth daily. ) 90 tablet 3  . levothyroxine (SYNTHROID, LEVOTHROID) 112 MCG tablet Take 112 mcg by mouth daily.      . Multiple Vitamin (MULTIVITAMIN) tablet Take 1 tablet by mouth daily. Take 1/2 tablet a day.    . warfarin (COUMADIN) 2 MG tablet TAKE ONE TABLET BY MOUTH ONCE DAILY EXCEPT ONE & ONE-HALF TAB DAILY ON SUNDAYS, TUESDAYS, AND THURSDAYS 90 tablet 3   No current facility-administered medications for this visit.      Past Medical History:  Diagnosis Date  . AICD (automatic cardioverter/defibrillator) present   . Arteriosclerotic cardiovascular disease (ASCVD) 2003   status post CABG surgery-2003 following AMI; ischemic cardiomyopathy with an EF of 25%; s/p AICD implantation-2007; h/o LBBB; possible apical thrombus   . CHF (congestive heart failure) (HCC)   . Chronic anticoagulation    continued for both PAF & possible apical mural thrombus.  . Coronary artery disease   . Hyperlipidemia   . Hypertension   . Hypothyroid   . Mitral regurgitation    Mild by echo in 08/2007 but moderate to severe in 09/2008  . Other  primary cardiomyopathies   . Paroxysmal atrial fibrillation (HCC) 2003   Subsequently permanent    ROS:   All systems reviewed and negative except as noted in the HPI.   Past Surgical History:  Procedure Laterality Date  . BI-VENTRICULAR PACEMAKER INSERTION N/A 05/01/2012   Procedure: BI-VENTRICULAR PACEMAKER INSERTION (CRT-P);  Surgeon: Marinus Maw, MD;  Location: [facility] CATH LAB;  Service: Cardiovascular;  Laterality: N/A;  . CARDIAC DEFIBRILLATOR PLACEMENT  2007  . CORONARY ARTERY BYPASS GRAFT  2003  . INSERT / REPLACE / REMOVE PACEMAKER       History reviewed. No pertinent family history.   Social History   Social History  . Marital status: Married    Spouse name: N/A  . Number of children: N/A  . Years of education: N/A   Occupational History  . Not on file.   Social History Main Topics  . Smoking status: Never Smoker  . Smokeless tobacco: Never Used  . Alcohol use No  . Drug use: No  . Sexual activity: Not Currently   Other Topics Concern  . Not on file   Social History Narrative  . No narrative on file     BP 110/62   Pulse 71   Ht 5' 11.5" (1.816 m)   Wt 164 lb (74.4 kg)   SpO2 97%   BMI 22.55 kg/m   Physical Exam:  Well appearing elderly man, NAD HEENT: Unremarkable Neck:  7 cm JVD, no thyromegally Back:  No CVA tenderness Lungs:  Clear with no  wheezes, rales, or rhonchi. HEART:  Regular rate rhythm, no murmurs, no rubs, no clicks Abd:  soft, positive bowel sounds, no organomegally, no rebound, no guarding Ext:  2 plus pulses, no edema, no cyanosis, no clubbing Skin:  No rashes no nodules Neuro:  CN II through XII intact, motor grossly intact  DEVICE  Normal device function.  See PaceArt for details.   Assess/Plan: 1. Atrial fib - his ventricular rate is well controlled. He has underlying CHB. 2. Chronic systolic heart failure - his symptoms remain class 2. He is encouraged to maintain a low sodium diet. 3. PPM - his [place] BiV PPM is  working normally. Will follow.  Leonia ReevesGregg [name],M.D

## 2016-06-01 NOTE — Progress Notes (Signed)
This encounter was created in error - please disregard.

## 2016-06-02 NOTE — Telephone Encounter (Signed)
ERROR

## 2016-06-21 ENCOUNTER — Ambulatory Visit (INDEPENDENT_AMBULATORY_CARE_PROVIDER_SITE_OTHER): Payer: Medicare Other | Admitting: *Deleted

## 2016-06-21 DIAGNOSIS — Z5181 Encounter for therapeutic drug level monitoring: Secondary | ICD-10-CM

## 2016-06-21 DIAGNOSIS — I4891 Unspecified atrial fibrillation: Secondary | ICD-10-CM

## 2016-06-21 LAB — POCT INR: INR: 1.6

## 2016-07-05 ENCOUNTER — Ambulatory Visit (INDEPENDENT_AMBULATORY_CARE_PROVIDER_SITE_OTHER): Payer: Medicare Other | Admitting: *Deleted

## 2016-07-05 DIAGNOSIS — Z5181 Encounter for therapeutic drug level monitoring: Secondary | ICD-10-CM

## 2016-07-05 DIAGNOSIS — I4891 Unspecified atrial fibrillation: Secondary | ICD-10-CM | POA: Diagnosis not present

## 2016-07-05 LAB — POCT INR: INR: 1.7

## 2016-07-26 ENCOUNTER — Encounter: Payer: Self-pay | Admitting: *Deleted

## 2016-08-02 ENCOUNTER — Ambulatory Visit (INDEPENDENT_AMBULATORY_CARE_PROVIDER_SITE_OTHER): Payer: Medicare Other | Admitting: *Deleted

## 2016-08-02 DIAGNOSIS — I4891 Unspecified atrial fibrillation: Secondary | ICD-10-CM | POA: Diagnosis not present

## 2016-08-02 DIAGNOSIS — Z5181 Encounter for therapeutic drug level monitoring: Secondary | ICD-10-CM | POA: Diagnosis not present

## 2016-08-02 LAB — POCT INR: INR: 2

## 2016-08-30 ENCOUNTER — Ambulatory Visit (INDEPENDENT_AMBULATORY_CARE_PROVIDER_SITE_OTHER): Payer: Medicare Other | Admitting: *Deleted

## 2016-08-30 DIAGNOSIS — Z5181 Encounter for therapeutic drug level monitoring: Secondary | ICD-10-CM

## 2016-08-30 DIAGNOSIS — I4891 Unspecified atrial fibrillation: Secondary | ICD-10-CM | POA: Diagnosis not present

## 2016-08-30 LAB — POCT INR: INR: 2.6

## 2016-10-04 ENCOUNTER — Encounter: Payer: Self-pay | Admitting: *Deleted

## 2016-11-01 ENCOUNTER — Ambulatory Visit (INDEPENDENT_AMBULATORY_CARE_PROVIDER_SITE_OTHER): Payer: Medicare Other | Admitting: *Deleted

## 2016-11-01 DIAGNOSIS — Z5181 Encounter for therapeutic drug level monitoring: Secondary | ICD-10-CM | POA: Diagnosis not present

## 2016-11-01 DIAGNOSIS — I480 Paroxysmal atrial fibrillation: Secondary | ICD-10-CM | POA: Diagnosis not present

## 2016-11-01 LAB — POCT INR: INR: 2.6

## 2016-11-07 ENCOUNTER — Emergency Department (HOSPITAL_COMMUNITY): Payer: Medicare Other

## 2016-11-07 ENCOUNTER — Observation Stay (HOSPITAL_COMMUNITY)
Admission: EM | Admit: 2016-11-07 | Discharge: 2016-11-08 | Disposition: A | Discharge status: Home / Self Care | Payer: Medicare Other | Attending: Internal Medicine | Admitting: Internal Medicine

## 2016-11-07 ENCOUNTER — Encounter (HOSPITAL_COMMUNITY): Payer: Self-pay | Admitting: *Deleted

## 2016-11-07 DIAGNOSIS — E785 Hyperlipidemia, unspecified: Secondary | ICD-10-CM | POA: Diagnosis present

## 2016-11-07 DIAGNOSIS — R7989 Other specified abnormal findings of blood chemistry: Secondary | ICD-10-CM | POA: Insufficient documentation

## 2016-11-07 DIAGNOSIS — Z7901 Long term (current) use of anticoagulants: Secondary | ICD-10-CM

## 2016-11-07 DIAGNOSIS — I4891 Unspecified atrial fibrillation: Secondary | ICD-10-CM | POA: Diagnosis present

## 2016-11-07 DIAGNOSIS — N183 Chronic kidney disease, stage 3 unspecified: Secondary | ICD-10-CM | POA: Diagnosis present

## 2016-11-07 DIAGNOSIS — I110 Hypertensive heart disease with heart failure: Secondary | ICD-10-CM | POA: Diagnosis not present

## 2016-11-07 DIAGNOSIS — J111 Influenza due to unidentified influenza virus with other respiratory manifestations: Secondary | ICD-10-CM

## 2016-11-07 DIAGNOSIS — R69 Illness, unspecified: Secondary | ICD-10-CM

## 2016-11-07 DIAGNOSIS — I509 Heart failure, unspecified: Secondary | ICD-10-CM | POA: Insufficient documentation

## 2016-11-07 DIAGNOSIS — Z950 Presence of cardiac pacemaker: Secondary | ICD-10-CM | POA: Diagnosis present

## 2016-11-07 DIAGNOSIS — R062 Wheezing: Secondary | ICD-10-CM | POA: Diagnosis present

## 2016-11-07 DIAGNOSIS — Z79899 Other long term (current) drug therapy: Secondary | ICD-10-CM | POA: Insufficient documentation

## 2016-11-07 DIAGNOSIS — E039 Hypothyroidism, unspecified: Secondary | ICD-10-CM | POA: Diagnosis not present

## 2016-11-07 DIAGNOSIS — R5383 Other fatigue: Secondary | ICD-10-CM | POA: Insufficient documentation

## 2016-11-07 DIAGNOSIS — R05 Cough: Principal | ICD-10-CM | POA: Insufficient documentation

## 2016-11-07 DIAGNOSIS — R778 Other specified abnormalities of plasma proteins: Secondary | ICD-10-CM

## 2016-11-07 LAB — CBC WITH DIFFERENTIAL/PLATELET
BASOS PCT: 0 %
Basophils Absolute: 0 10*3/uL (ref 0.0–0.1)
EOS ABS: 0 10*3/uL (ref 0.0–0.7)
Eosinophils Relative: 0 %
HEMATOCRIT: 41.4 % (ref 39.0–52.0)
HEMOGLOBIN: 13.8 g/dL (ref 13.0–17.0)
Lymphocytes Relative: 25 %
Lymphs Abs: 1.3 10*3/uL (ref 0.7–4.0)
MCH: 31.7 pg (ref 26.0–34.0)
MCHC: 33.3 g/dL (ref 30.0–36.0)
MCV: 95 fL (ref 78.0–100.0)
MONOS PCT: 8 %
Monocytes Absolute: 0.4 10*3/uL (ref 0.1–1.0)
NEUTROS ABS: 3.6 10*3/uL (ref 1.7–7.7)
NEUTROS PCT: 67 %
Platelets: 95 10*3/uL — ABNORMAL LOW (ref 150–400)
RBC: 4.36 MIL/uL (ref 4.22–5.81)
RDW: 14.7 % (ref 11.5–15.5)
WBC: 5.3 10*3/uL (ref 4.0–10.5)

## 2016-11-07 LAB — URINALYSIS, ROUTINE W REFLEX MICROSCOPIC
BILIRUBIN URINE: NEGATIVE
GLUCOSE, UA: NEGATIVE mg/dL
KETONES UR: NEGATIVE mg/dL
Leukocytes, UA: NEGATIVE
NITRITE: NEGATIVE
PH: 5 (ref 5.0–8.0)
Protein, ur: 30 mg/dL — AB
Specific Gravity, Urine: 1.02 (ref 1.005–1.030)

## 2016-11-07 LAB — COMPREHENSIVE METABOLIC PANEL
ALBUMIN: 4 g/dL (ref 3.5–5.0)
ALT: 31 U/L (ref 17–63)
ANION GAP: 9 (ref 5–15)
AST: 63 U/L — ABNORMAL HIGH (ref 15–41)
Alkaline Phosphatase: 102 U/L (ref 38–126)
BUN: 30 mg/dL — AB (ref 6–20)
CHLORIDE: 105 mmol/L (ref 101–111)
CO2: 25 mmol/L (ref 22–32)
Calcium: 8.8 mg/dL — ABNORMAL LOW (ref 8.9–10.3)
Creatinine, Ser: 1.57 mg/dL — ABNORMAL HIGH (ref 0.61–1.24)
GFR calc Af Amer: 42 mL/min — ABNORMAL LOW (ref >60)
GFR calc non Af Amer: 37 mL/min — ABNORMAL LOW (ref >60)
GLUCOSE: 112 mg/dL — AB (ref 65–99)
POTASSIUM: 4.9 mmol/L (ref 3.5–5.1)
SODIUM: 139 mmol/L (ref 135–145)
Total Bilirubin: 1.8 mg/dL — ABNORMAL HIGH (ref 0.3–1.2)
Total Protein: 7.1 g/dL (ref 6.5–8.1)

## 2016-11-07 LAB — INFLUENZA PANEL BY PCR (TYPE A & B)
Influenza A By PCR: NEGATIVE
Influenza B By PCR: NEGATIVE

## 2016-11-07 LAB — LIPASE, BLOOD: LIPASE: 13 U/L (ref 11–51)

## 2016-11-07 LAB — TROPONIN I: Troponin I: 0.06 ng/mL (ref <0.03)

## 2016-11-07 MED ORDER — IPRATROPIUM-ALBUTEROL 0.5-2.5 (3) MG/3ML IN SOLN
3.0000 mL | Freq: Once | RESPIRATORY_TRACT | Status: AC
  Administered 2016-11-07: 3 mL via RESPIRATORY_TRACT
  Filled 2016-11-07: qty 3

## 2016-11-07 NOTE — ED Notes (Signed)
CRITICAL VALUE ALERT  Critical value received:  Troponin 0.06  Date of notification:  11/07/2016  Time of notification:  2248  Critical value read back:Yes.    Nurse who received alert:  Neldon Mcina Teigan Sahli RN  MD notified (1st page):  Dr. Clarene DukeMcManus  Time of first page:  2249  MD notified (2nd page):  Time of second page:  Responding MD:    Time MD responded:

## 2016-11-07 NOTE — ED Triage Notes (Signed)
Pt comes in for cough lasting 4 days. Pt states his cough is productive. Pt denies any n/v/d. Pt has wheezing in left upper lobe.

## 2016-11-07 NOTE — ED Notes (Signed)
Pt ambulated to the bathroom to void after getting his EKG and lab work done.  Pt is alert and oriented, he continues to have a little cough

## 2016-11-07 NOTE — ED Notes (Signed)
Pt assisted to restroom at this time.

## 2016-11-08 ENCOUNTER — Encounter (HOSPITAL_COMMUNITY): Payer: Self-pay

## 2016-11-08 DIAGNOSIS — N183 Chronic kidney disease, stage 3 (moderate): Secondary | ICD-10-CM

## 2016-11-08 DIAGNOSIS — I482 Chronic atrial fibrillation: Secondary | ICD-10-CM

## 2016-11-08 DIAGNOSIS — R748 Abnormal levels of other serum enzymes: Secondary | ICD-10-CM | POA: Diagnosis not present

## 2016-11-08 DIAGNOSIS — E782 Mixed hyperlipidemia: Secondary | ICD-10-CM

## 2016-11-08 DIAGNOSIS — Z7901 Long term (current) use of anticoagulants: Secondary | ICD-10-CM

## 2016-11-08 DIAGNOSIS — R05 Cough: Secondary | ICD-10-CM | POA: Diagnosis not present

## 2016-11-08 DIAGNOSIS — R062 Wheezing: Secondary | ICD-10-CM | POA: Diagnosis present

## 2016-11-08 LAB — TSH: TSH: 0.463 u[IU]/mL (ref 0.350–4.500)

## 2016-11-08 LAB — TROPONIN I
TROPONIN I: 0.06 ng/mL — AB (ref <0.03)
Troponin I: 0.07 ng/mL (ref <0.03)
Troponin I: 0.06 ng/mL (ref <0.03)

## 2016-11-08 LAB — BASIC METABOLIC PANEL
ANION GAP: 7 (ref 5–15)
BUN: 31 mg/dL — ABNORMAL HIGH (ref 6–20)
CALCIUM: 8.7 mg/dL — AB (ref 8.9–10.3)
CO2: 26 mmol/L (ref 22–32)
Chloride: 104 mmol/L (ref 101–111)
Creatinine, Ser: 1.71 mg/dL — ABNORMAL HIGH (ref 0.61–1.24)
GFR calc Af Amer: 38 mL/min — ABNORMAL LOW (ref >60)
GFR calc non Af Amer: 33 mL/min — ABNORMAL LOW (ref >60)
Glucose, Bld: 119 mg/dL — ABNORMAL HIGH (ref 65–99)
POTASSIUM: 4.2 mmol/L (ref 3.5–5.1)
Sodium: 137 mmol/L (ref 135–145)

## 2016-11-08 LAB — PROTIME-INR
INR: 2.12
PROTHROMBIN TIME: 24.1 s — AB (ref 11.4–15.2)

## 2016-11-08 MED ORDER — WARFARIN - PHARMACIST DOSING INPATIENT: Status: DC

## 2016-11-08 MED ORDER — LEVOTHYROXINE SODIUM 112 MCG PO TABS
112.0000 ug | ORAL_TABLET | Freq: Every day | ORAL | Status: DC
  Administered 2016-11-08: 112 ug via ORAL
  Filled 2016-11-08: qty 1

## 2016-11-08 MED ORDER — DEXTROSE-NACL 5-0.9 % IV SOLN
INTRAVENOUS | Status: DC
  Administered 2016-11-08: 02:00:00 via INTRAVENOUS

## 2016-11-08 MED ORDER — CARVEDILOL 3.125 MG PO TABS
3.1250 mg | ORAL_TABLET | Freq: Two times a day (BID) | ORAL | Status: DC
  Administered 2016-11-08: 3.125 mg via ORAL
  Filled 2016-11-08: qty 1

## 2016-11-08 MED ORDER — PREDNISONE 10 MG PO TABS: 10.0000 mg | ORAL_TABLET | Freq: Every day | ORAL | 0 refills | Status: DC

## 2016-11-08 MED ORDER — ALBUTEROL SULFATE (2.5 MG/3ML) 0.083% IN NEBU: 2.5000 mg | INHALATION_SOLUTION | RESPIRATORY_TRACT | Status: DC | PRN

## 2016-11-08 MED ORDER — WARFARIN SODIUM 2 MG PO TABS: 2.0000 mg | ORAL_TABLET | Freq: Once | ORAL | Status: DC

## 2016-11-08 MED ORDER — ALBUTEROL SULFATE (2.5 MG/3ML) 0.083% IN NEBU
2.5000 mg | INHALATION_SOLUTION | Freq: Four times a day (QID) | RESPIRATORY_TRACT | Status: DC
  Administered 2016-11-08 (×3): 2.5 mg via RESPIRATORY_TRACT
  Filled 2016-11-08 (×3): qty 3

## 2016-11-08 MED ORDER — METHYLPREDNISOLONE SODIUM SUCC 40 MG IJ SOLR
40.0000 mg | Freq: Four times a day (QID) | INTRAMUSCULAR | Status: DC
  Administered 2016-11-08 (×3): 40 mg via INTRAVENOUS
  Filled 2016-11-08 (×3): qty 1

## 2016-11-08 MED ORDER — ACETAMINOPHEN 325 MG PO TABS: 650.0000 mg | ORAL_TABLET | Freq: Four times a day (QID) | ORAL | Status: DC | PRN

## 2016-11-08 MED ORDER — ACETAMINOPHEN 650 MG RE SUPP: 650.0000 mg | Freq: Four times a day (QID) | RECTAL | Status: DC | PRN

## 2016-11-08 NOTE — Progress Notes (Signed)
ANTICOAGULATION CONSULT NOTE - Initial Consult  Pharmacy Consult for COUMADIN  (home med) Indication: atrial fibrillation  Allergies  Allergen Reactions  . Statins Rash   Patient Measurements: Height: 5\' 11"  (180.3 cm) Weight: 161 lb 3.2 oz (73.1 kg) IBW/kg (Calculated) : 75.3  Vital Signs: Temp: 98.1 F (36.7 C) (03/07 0200) Temp Source: Oral (03/07 0200) BP: 110/70 (03/07 0729) Pulse Rate: 69 (03/07 0729)  Labs:  Recent Labs  11/07/16 2138 11/08/16 0222 11/08/16 0744  HGB 13.8  --   --   HCT 41.4  --   --   PLT 95*  --   --   LABPROT 24.1*  --   --   INR 2.12  --   --   CREATININE 1.57* 1.71*  --   TROPONINI 0.06* 0.07* 0.06*   Estimated Creatinine Clearance: 28.5 mL/min (by C-G formula based on SCr of 1.71 mg/dL (H)).  Medical History: Past Medical History:  Diagnosis Date  . AICD (automatic cardioverter/defibrillator) present   . Arteriosclerotic cardiovascular disease (ASCVD) 2003   status post CABG surgery-2003 following AMI; ischemic cardiomyopathy with an EF of 25%; s/p AICD implantation-2007; h/o LBBB; possible apical thrombus   . CHF (congestive heart failure) (HCC)   . Chronic anticoagulation    continued for both PAF & possible apical mural thrombus.  . Coronary artery disease   . Hyperlipidemia   . Hypertension   . Hypothyroid   . Mitral regurgitation    Mild by echo in 08/2007 but moderate to severe in 09/2008  . Other primary cardiomyopathies   . Paroxysmal atrial fibrillation (HCC) 2003   Subsequently permanent    Medications:  Prescriptions Prior to Admission  Medication Sig Dispense Refill Last Dose  . carvedilol (COREG) 3.125 MG tablet Take 1 tablet (3.125 mg total) by mouth 2 (two) times daily. 180 tablet 3 11/07/2016 at Unknown time  . furosemide (LASIX) 40 MG tablet Take 1 tablet (40 mg total) by mouth daily. (Patient taking differently: Take 10 mg by mouth every other day. Takes as directed upon weighing. Takes when weight is 155 or  higher) 90 tablet 3 11/06/2016 at Unknown time  . levothyroxine (SYNTHROID, LEVOTHROID) 112 MCG tablet Take 112 mcg by mouth daily.     11/07/2016 at Unknown time  . Multiple Vitamin (MULTIVITAMIN WITH MINERALS) TABS tablet Take 0.5 tablets by mouth daily.   11/07/2016 at Unknown time  . warfarin (COUMADIN) 2 MG tablet TAKE ONE TABLET BY MOUTH ONCE DAILY EXCEPT ONE & ONE-HALF TAB DAILY ON SUNDAYS, TUESDAYS, AND THURSDAYS (Patient taking differently: Take 2-3 mg by mouth every evening. Take one and one-half tablet on all days except on Sunday, take one tablet) 90 tablet 3 11/07/2016 at Unknown time   Assessment: 83yo male on chronic Coumadin PTA.  INR therapeutic when admitted.    Goal of Therapy:  INR 2-3 Monitor platelets by anticoagulation protocol: Yes   Plan:  Coumadin 2mg  today x 1 (home dose) INR daily  Yolanda Huffstetler A 11/08/2016,10:34 AM

## 2016-11-08 NOTE — Discharge Summary (Signed)
Physician Discharge Summary  [name] ZOX:[id number] DOB: 1922/12/26 DOA: 11/07/2016  PCP: Dwana Melena, MD  Admit date: 11/07/2016 Discharge date: 11/08/2016  Time spent: 45 minutes  Recommendations for Outpatient Follow-up:  -Will be discharged home today. -Advised to follow up with PCP in 2 weeks.   Discharge Diagnoses:  Active Problems:   Hypothyroidism   Hyperlipidemia   Chronic anticoagulation   Biventricular cardiac pacemaker in situ   Chronic kidney disease, stage III (moderate)   Atrial fibrillation (HCC)   Wheezing   Discharge Condition: Stable and improved  Filed Weights   11/07/16 1826 11/08/16 0200  Weight: 74.8 kg (165 lb) 73.1 kg (161 lb 3.2 oz)    History of present illness:  As per Dr. Conley Rolls on 3/7: [name] is an 83 y.o. male with hx of CAD, HTN, HLD, afib on Coumadin, hx of CABG with EF 25%, s/p ICD, hypothyrodism, presented to the ER with SOB for several days.  He has no fever or chills, and no myalgia.  In the ER, he was found be have wheezing, and he was given Neb Tx.  His CXR showed no PNA, and serology showed normal WBC and Hb, but he has elevated Cr to 1.57 and troponin of 0.06.  Due to the elevated troponin, hospitalist was asked to admit him for cardiac r/out.  He has no chest pain, and his EKG showed paced rhythm.     Hospital Course:   Wheezing -no h/o COPD, no h/o tobacco use. -Was given nebs and solumedrol. Not wheezing on DC. -Will DC on a prednisone taper, see no need for abx.  Elevated Troponin -Flat, max 0.07. No CP, EKG with paced rhythm.  Stage III CKD -At baseline.  A Fib -Rate controlled. -Anticoagulated on coumadin.  Procedures:  None   Consultations:  None  Discharge Instructions  Discharge Instructions    Diet - low sodium heart healthy    Complete by:  As directed    Increase activity slowly    Complete by:  As directed      Allergies as of 11/08/2016      Reactions   Statins Rash        Medication List    TAKE these medications   carvedilol 3.125 MG tablet Commonly known as:  COREG Take 1 tablet (3.125 mg total) by mouth 2 (two) times daily.   furosemide 40 MG tablet Commonly known as:  LASIX Take 1 tablet (40 mg total) by mouth daily. What changed:  how much to take  when to take this  additional instructions   levothyroxine 112 MCG tablet Commonly known as:  SYNTHROID, LEVOTHROID Take 112 mcg by mouth daily.   multivitamin with minerals Tabs tablet Take 0.5 tablets by mouth daily.   predniSONE 10 MG tablet Commonly known as:  DELTASONE Take 1 tablet (10 mg total) by mouth daily with breakfast. Take 6 tablets today and then decrease by 1 tablet daily until none are left.   warfarin 2 MG tablet Commonly known as:  COUMADIN TAKE ONE TABLET BY MOUTH ONCE DAILY EXCEPT ONE & ONE-HALF TAB DAILY ON SUNDAYS, TUESDAYS, AND THURSDAYS What changed:  how much to take  how to take this  when to take this  additional instructions      Allergies  Allergen Reactions  . Statins Rash   Follow-up Information    Dwana Melena, MD. Schedule an appointment as soon as possible for a visit on 11/22/2016.   Specialty:  Internal Medicine Contact information: [address] [place] [address] [phone]            The results of significant diagnostics from this hospitalization (including imaging, microbiology, ancillary and laboratory) are listed below for reference.    Significant Diagnostic Studies: Dg Chest 2 View  Result Date: 11/07/2016 CLINICAL DATA:  Cough and shortness of breath for 3 days. History of CHF, atrial fibrillation. EXAM: CHEST  2 VIEW COMPARISON:  Chest radiograph May 01, 2012 FINDINGS: Cardiac silhouette is mildly enlarged unchanged. Stable prominent pulmonary hila. Tortuous, possibly ectatic aorta. Status post median sternotomy for CABG, chronically fractured most caudal wire. Three lead LEFT cardiac defibrillator in situ. Mild  chronic interstitial changes with strandy densities of the lung bases. No pleural effusion or focal consolidation. No pneumothorax. Osteopenia. Soft tissue planes are nonsuspicious. IMPRESSION: Mild chronic interstitial changes and bibasilar atelectasis/ scarring. Mild cardiomegaly. Similar fullness of the hila most compatible with enlarged pulmonary artery and pulmonary arterial hypertension. Electronically Signed   By: Awilda Metro M.D.   On: 11/07/2016 19:19    Microbiology: No results found for this or any previous visit (from the past 240 hour(s)).   Labs: Basic Metabolic Panel:  Recent Labs Lab 11/07/16 2138 11/08/16 0222  NA 139 137  K 4.9 4.2  CL 105 104  CO2 25 26  GLUCOSE 112* 119*  BUN 30* 31*  CREATININE 1.57* 1.71*  CALCIUM 8.8* 8.7*   Liver Function Tests:  Recent Labs Lab 11/07/16 2138  AST 63*  ALT 31  ALKPHOS 102  BILITOT 1.8*  PROT 7.1  ALBUMIN 4.0    Recent Labs Lab 11/07/16 2138  LIPASE 13   No results for input(s): AMMONIA in the last 168 hours. CBC:  Recent Labs Lab 11/07/16 2138  WBC 5.3  NEUTROABS 3.6  HGB 13.8  HCT 41.4  MCV 95.0  PLT 95*   Cardiac Enzymes:  Recent Labs Lab 11/07/16 2138 11/08/16 0222 11/08/16 0744  TROPONINI 0.06* 0.07* 0.06*   BNP: BNP (last 3 results) No results for input(s): BNP in the last 8760 hours.  ProBNP (last 3 results) No results for input(s): PROBNP in the last 8760 hours.  CBG: No results for input(s): GLUCAP in the last 168 hours.     SignedChaya Jan  [facility] Pager: [phone] 11/08/2016, 2:23 PM

## 2016-11-08 NOTE — ED Notes (Signed)
Per Herbert SetaHeather, RN patient may come up fully clothed, if I start IV. Per Dr. Conley RollsLE patient can be transported to the floor without telemetry.

## 2016-11-08 NOTE — Progress Notes (Signed)
Pt troponin elevated from 0.06 to 0.07, MD made aware, no further orders received.

## 2016-11-08 NOTE — Progress Notes (Signed)
Patient with orders to be discharge home. Discharge instructions given, patient verbalized understanding. Prescriptions given. Patient stable. Patient left in private vehicle with family.  

## 2016-11-08 NOTE — Care Management Note (Signed)
Case Management Note  Patient Details  Name: Renato GailsJasper V Nieman MRN: [id number] Date of Birth: 02-15-1923  Subjective/Objective:                  Pt is from home, lives with his wife who has dementia. 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 He has pcp, transportation and no difficulty affording or managing his medications. He has multiple children who plan to stay with him for the next few days.   Action/Plan: Discharging home today. No CM needs.   Expected Discharge Date:  11/08/16               Expected Discharge Plan:  Home/Self Care  In-House Referral:  NA  Discharge planning Services  CM Consult  Post Acute Care Choice:  NA Choice offered to:  NA  Status of Service:  Completed, signed off  Malcolm MetroChildress, Kimberleigh Mehan Demske, RN 11/08/2016, 1:07 PM

## 2016-11-08 NOTE — Care Management Obs Status (Signed)
MEDICARE OBSERVATION STATUS NOTIFICATION   Patient Details  Name: Renato GailsJasper V Lieb MRN: [id number] Date of Birth: 06-13-1923   Medicare Observation Status Notification Given:  Other (see comment) (Pt discharged <24 hrs)    Malcolm Metrohildress, Vanette Noguchi Demske, RN 11/08/2016, 1:10 PM

## 2016-11-08 NOTE — ED Provider Notes (Signed)
[facility]-EMERGENCY DEPT Provider Note   CSN: [id number] Arrival date & time: 11/07/16  1808     History   Chief Complaint Chief Complaint  Patient presents with  . Cough  . Fatigue    HPI [name] is a 83 y.o. male.  HPI  Pt was seen at 2120. Per pt, c/o gradual onset and worsening of constant generalized weakness for the past 3 days. Has been associated with cough, "wheezing," poor PO intake, and subjective home fevers. Denies CP/palpitations, no abd pain, no N/V/D, no back pain, no sore throat, no rash.   Past Medical History:  Diagnosis Date  . AICD (automatic cardioverter/defibrillator) present   . Arteriosclerotic cardiovascular disease (ASCVD) 2003   status post CABG surgery-2003 following AMI; ischemic cardiomyopathy with an EF of 25%; s/p AICD implantation-2007; h/o LBBB; possible apical thrombus   . CHF (congestive heart failure) (HCC)   . Chronic anticoagulation    continued for both PAF & possible apical mural thrombus.  . Coronary artery disease   . Hyperlipidemia   . Hypertension   . Hypothyroid   . Mitral regurgitation    Mild by echo in 08/2007 but moderate to severe in 09/2008  . Other primary cardiomyopathies   . Paroxysmal atrial fibrillation (HCC) 2003   Subsequently permanent    Patient Active Problem List   Diagnosis Date Noted  . Wheezing 11/08/2016  . Encounter for therapeutic drug monitoring 10/23/2013  . Chronic systolic heart failure (HCC) 05/20/2013  . Skin macule or macular rash 05/20/2013  . Atrial fibrillation (HCC) 12/05/2012  . Chronic kidney disease, stage III (moderate) 10/25/2012  . Thrombocytopenia-resolved 05/31/2011  . Hypertension   . Arteriosclerotic cardiovascular disease (ASCVD)   . Biventricular cardiac pacemaker in situ   . Chronic anticoagulation 11/19/2010  . Hypothyroidism 08/31/2010  . Hyperlipidemia 08/31/2010    Past Surgical History:  Procedure Laterality Date  . BI-VENTRICULAR PACEMAKER INSERTION N/A  05/01/2012   Procedure: BI-VENTRICULAR PACEMAKER INSERTION (CRT-P);  Surgeon: Marinus Maw, MD;  Location[facility] C[facility] LAB;  Service: Cardiovascular;  Laterality: N/A;  . CARDIAC DEFIBRILLATOR PLACEMENT  2007  . CORONARY ARTERY BYPASS GRAFT  2003  . INSERT / REPLACE / REMOVE PACEMAKER         Home Medications    Prior to Admission medications   Medication Sig Start Date End Date Taking? Authorizing Provider  carvedilol (COREG) 3.125 MG tablet Take 1 tablet (3.125 mg total) by mouth 2 (two) times daily. 10/14/15  Yes Antoine Poche, MD  furosemide (LASIX) 40 MG tablet Take 1 tablet (40 mg total) by mouth daily. Patient taking differently: Take 10 mg by mouth every other day. Takes as directed upon weighing. Takes when weight is 155 or higher 10/14/15  Yes Antoine Poche, MD  levothyroxine (SYNTHROID, LEVOTHROID) 112 MCG tablet Take 112 mcg by mouth daily.     Yes Historical Provider, MD  Multiple Vitamin (MULTIVITAMIN WITH MINERALS) TABS tablet Take 0.5 tablets by mouth daily.   Yes Historical Provider, MD  warfarin (COUMADIN) 2 MG tablet TAKE ONE TABLET BY MOUTH ONCE DAILY EXCEPT ONE & ONE-HALF TAB DAILY ON SUNDAYS, TUESDAYS, AND THURSDAYS Patient taking differently: Take 2-3 mg by mouth every evening. Take one and one-half tablet on all days except on Sunday, take one tablet 04/12/16  Yes Antoine Poche, MD    Family History No family history on file.  Social History Social History  Substance Use Topics  . Smoking status: Never Smoker  .  Smokeless tobacco: Never Used  . Alcohol use No     Allergies   Statins   Review of Systems Review of Systems ROS: Statement: All systems negative except as marked or noted in the HPI; Constitutional: +generalized weakness/fatigue, chills. ; ; Eyes: Negative for eye pain, redness and discharge. ; ; ENMT: Negative for ear pain, hoarseness, nasal congestion, sinus pressure and sore throat. ; ; Cardiovascular: Negative for chest pain,  palpitations, diaphoresis, dyspnea and peripheral edema. ; ; Respiratory: +cough, wheezing. Negative for stridor. ; ; Gastrointestinal: +poor PO intake. Negative for nausea, vomiting, diarrhea, abdominal pain, blood in stool, hematemesis, jaundice and rectal bleeding. . ; ; Genitourinary: Negative for dysuria, flank pain and hematuria. ; ; Musculoskeletal: Negative for back pain and neck pain. Negative for swelling and trauma.; ; Skin: Negative for pruritus, rash, abrasions, blisters, bruising and skin lesion.; ; Neuro: Negative for headache, lightheadedness and neck stiffness. Negative for altered level of consciousness, altered mental status, extremity weakness, paresthesias, involuntary movement, seizure and syncope.       Physical Exam Updated Vital Signs BP 117/75 (BP Location: Left Arm)   Pulse 71   Temp 98.8 F (37.1 C) (Oral)   Resp 18   Ht 5\' 11"  (1.803 m)   Wt 165 lb (74.8 kg)   SpO2 95%   BMI 23.01 kg/m   Physical Exam 2125: Physical examination:  Nursing notes reviewed; Vital signs and O2 SAT reviewed;  Constitutional: Well developed, Well nourished, Well hydrated, In no acute distress; Head:  Normocephalic, atraumatic; Eyes: EOMI, PERRL, No scleral icterus; ENMT: Mouth and pharynx normal, Mucous membranes moist; Neck: Supple, Full range of motion, No lymphadenopathy; Cardiovascular: Regular rate and rhythm, No gallop; Respiratory: Breath sounds coarse & equal bilaterally, No wheezes.  Speaking full sentences with ease, Normal respiratory effort/excursion; Chest: Nontender, Movement normal; Abdomen: Soft, Nontender, Nondistended, Normal bowel sounds; Genitourinary: No CVA tenderness; Extremities: Pulses normal, No tenderness, No edema, No calf edema or asymmetry.; Neuro: AA&Ox3, Major CN grossly intact.  Speech clear. No gross focal motor or sensory deficits in extremities. Climbs on and off stretcher easily by himself. Gait steady.; Skin: Color normal, Warm, Dry.   ED Treatments /  Results  Labs (all labs ordered are listed, but only abnormal results are displayed)   EKG  EKG Interpretation  Date/Time:  Tuesday November 07 2016 22:05:27 EST Ventricular Rate:  70 PR Interval:    QRS Duration: 142 QT Interval:  453 QTC Calculation: 489 R Axis:   -105 Text Interpretation:  Ventricular-paced rhythm No further analysis attempted due to paced rhythm Baseline wander When compared with ECG of 05/28/2011 No significant change was found Confirmed by [facility]  MD, Nicholos Johns [phone]) on 11/07/2016 10:54:04 PM       Radiology   Procedures Procedures (including critical care time)  Medications Ordered in ED Medications  ipratropium-albuterol (DUONEB) 0.5-2.5 (3) MG/3ML nebulizer solution 3 mL (3 mLs Nebulization Given 11/07/16 2213)     Initial Impression / Assessment and Plan / ED Course  I have reviewed the triage vital signs and the nursing notes.  Pertinent labs & imaging results that were available during my care of the patient were reviewed by me and considered in my medical decision making (see chart for details).  MDM Reviewed: previous chart, nursing note and vitals Reviewed previous: labs and ECG Interpretation: labs, ECG and x-ray   Results for orders placed or performed during the hospital encounter of 11/07/16  Comprehensive metabolic panel  Result Value Ref Range  Sodium 139 135 - 145 mmol/L   Potassium 4.9 3.5 - 5.1 mmol/L   Chloride 105 101 - 111 mmol/L   CO2 25 22 - 32 mmol/L   Glucose, Bld 112 (H) 65 - 99 mg/dL   BUN 30 (H) 6 - 20 mg/dL   Creatinine, Ser 1.61 (H) 0.61 - 1.24 mg/dL   Calcium 8.8 (L) 8.9 - 10.3 mg/dL   Total Protein 7.1 6.5 - 8.1 g/dL   Albumin 4.0 3.5 - 5.0 g/dL   AST 63 (H) 15 - 41 U/L   ALT 31 17 - 63 U/L   Alkaline Phosphatase 102 38 - 126 U/L   Total Bilirubin 1.8 (H) 0.3 - 1.2 mg/dL   GFR calc non Af Amer 37 (L) >60 mL/min   GFR calc Af Amer 42 (L) >60 mL/min   Anion gap 9 5 - 15  Lipase, blood  Result Value Ref  Range   Lipase 13 11 - 51 U/L  Troponin I  Result Value Ref Range   Troponin I 0.06 (HH) <0.03 ng/mL  CBC with Differential  Result Value Ref Range   WBC 5.3 4.0 - 10.5 K/uL   RBC 4.36 4.22 - 5.81 MIL/uL   Hemoglobin 13.8 13.0 - 17.0 g/dL   HCT 09.6 04.5 - 40.9 %   MCV 95.0 78.0 - 100.0 fL   MCH 31.7 26.0 - 34.0 pg   MCHC 33.3 30.0 - 36.0 g/dL   RDW 81.1 91.4 - 78.2 %   Platelets 95 (L) 150 - 400 K/uL   Neutrophils Relative % 67 %   Neutro Abs 3.6 1.7 - 7.7 K/uL   Lymphocytes Relative 25 %   Lymphs Abs 1.3 0.7 - 4.0 K/uL   Monocytes Relative 8 %   Monocytes Absolute 0.4 0.1 - 1.0 K/uL   Eosinophils Relative 0 %   Eosinophils Absolute 0.0 0.0 - 0.7 K/uL   Basophils Relative 0 %   Basophils Absolute 0.0 0.0 - 0.1 K/uL  Urinalysis, Routine w reflex microscopic  Result Value Ref Range   Color, Urine AMBER (A) YELLOW   APPearance HAZY (A) CLEAR   Specific Gravity, Urine 1.020 1.005 - 1.030   pH 5.0 5.0 - 8.0   Glucose, UA NEGATIVE NEGATIVE mg/dL   Hgb urine dipstick MODERATE (A) NEGATIVE   Bilirubin Urine NEGATIVE NEGATIVE   Ketones, ur NEGATIVE NEGATIVE mg/dL   Protein, ur 30 (A) NEGATIVE mg/dL   Nitrite NEGATIVE NEGATIVE   Leukocytes, UA NEGATIVE NEGATIVE   RBC / HPF 6-30 0 - 5 RBC/hpf   WBC, UA 0-5 0 - 5 WBC/hpf   Bacteria, UA RARE (A) NONE SEEN   Mucous PRESENT    Hyaline Casts, UA PRESENT    Granular Casts, UA PRESENT   Influenza panel by PCR (type A & B)  Result Value Ref Range   Influenza A By PCR NEGATIVE NEGATIVE   Influenza B By PCR NEGATIVE NEGATIVE   Dg Chest 2 View Result Date: 11/07/2016 CLINICAL DATA:  Cough and shortness of breath for 3 days. History of CHF, atrial fibrillation. EXAM: CHEST  2 VIEW COMPARISON:  Chest radiograph May 01, 2012 FINDINGS: Cardiac silhouette is mildly enlarged unchanged. Stable prominent pulmonary hila. Tortuous, possibly ectatic aorta. Status post median sternotomy for CABG, chronically fractured most caudal wire. Three lead  LEFT cardiac defibrillator in situ. Mild chronic interstitial changes with strandy densities of the lung bases. No pleural effusion or focal consolidation. No pneumothorax. Osteopenia. Soft tissue planes are nonsuspicious.  IMPRESSION: Mild chronic interstitial changes and bibasilar atelectasis/ scarring. Mild cardiomegaly. Similar fullness of the hila most compatible with enlarged pulmonary artery and pulmonary arterial hypertension. Electronically Signed   By: Awilda Metroourtnay  Bloomer M.D.   On: 11/07/2016 19:19    2345:  BUN/Cr per baseline. Troponin elevated, no old to compare. Pt given short neb with improvement in cough. Dx and testing d/w pt and family.  Questions answered.  Verb understanding, agreeable to admit.  T/C to [facility] Dr. Conley RollsLe, case discussed, including:  HPI, pertinent PM/SHx, VS/PE, dx testing, ED course and treatment:  Agreeable to admit.    Final Clinical Impressions(s) / ED Diagnoses   Final diagnoses:  Influenza-like illness  Elevated troponin    New Prescriptions New Prescriptions   No medications on file     Samuel JesterKathleen Marlaya Turck, DO 11/10/16 1307

## 2016-11-08 NOTE — H&P (Signed)
History and Physical    [name]:[id number] DOB: 23-Mar-1923 DOA: 11/07/2016  PCP: Dwana Melena, MD  Patient coming from: Home.    Chief Complaint: Non productive cough and SOB.   HPI: [name] is an 83 y.o. male with hx of CAD, HTN, HLD, afib on Coumadin, hx of CABG with EF 25%, s/p ICD, hypothyrodism, presented to the ER with SOB for several days.  He has no fever or chills, and no myalgia.  In the ER, he was found be have wheezing, and he was given Neb Tx.  His CXR showed no PNA, and serology showed normal WBC and Hb, but he has elevated Cr to 1.57 and troponin of 0.06.  Due to the elevated troponin, hospitalist was asked to admit him for cardiac r/out.  He has no chest pain, and his EKG showed paced rhythm.    ED Course:  See above.  Rewiew of Systems:  Constitutional: Negative for malaise, fever and chills. No significant weight loss or weight gain Eyes: Negative for eye pain, redness and discharge, diplopia, visual changes, or flashes of light. ENMT: Negative for ear pain, hoarseness, nasal congestion, sinus pressure and sore throat. No headaches; tinnitus, drooling, or problem swallowing. Cardiovascular: Negative for chest pain, palpitations, diaphoresis, dyspnea and peripheral edema. ; No orthopnea, PND Respiratory: Negative for cough, hemoptysis, and stridor. No pleuritic chestpain. Gastrointestinal: Negative for diarrhea, constipation,  melena, blood in stool, hematemesis, jaundice and rectal bleeding.    Genitourinary: Negative for frequency, dysuria, incontinence,flank pain and hematuria; Musculoskeletal: Negative for back pain and neck pain. Negative for swelling and trauma.;  Skin: . Negative for pruritus, rash, abrasions, bruising and skin lesion.; ulcerations Neuro: Negative for headache, lightheadedness and neck stiffness. Negative for weakness, altered level of consciousness , altered mental status, extremity weakness, burning feet, involuntary movement, seizure  and syncope.  Psych: negative for anxiety, depression, insomnia, tearfulness, panic attacks, hallucinations, paranoia, suicidal or homicidal ideation    Past Medical History:  Diagnosis Date  . AICD (automatic cardioverter/defibrillator) present   . Arteriosclerotic cardiovascular disease (ASCVD) 2003   status post CABG surgery-2003 following AMI; ischemic cardiomyopathy with an EF of 25%; s/p AICD implantation-2007; h/o LBBB; possible apical thrombus   . CHF (congestive heart failure) (HCC)   . Chronic anticoagulation    continued for both PAF & possible apical mural thrombus.  . Coronary artery disease   . Hyperlipidemia   . Hypertension   . Hypothyroid   . Mitral regurgitation    Mild by echo in 08/2007 but moderate to severe in 09/2008  . Other primary cardiomyopathies   . Paroxysmal atrial fibrillation (HCC) 2003   Subsequently permanent    Past Surgical History:  Procedure Laterality Date  . BI-VENTRICULAR PACEMAKER INSERTION N/A 05/01/2012   Procedure: BI-VENTRICULAR PACEMAKER INSERTION (CRT-P);  Surgeon: Marinus Maw, MD;  Location: [facility] CATH LAB;  Service: Cardiovascular;  Laterality: N/A;  . CARDIAC DEFIBRILLATOR PLACEMENT  2007  . CORONARY ARTERY BYPASS GRAFT  2003  . INSERT / REPLACE / REMOVE PACEMAKER       reports that he has never smoked. He has never used smokeless tobacco. He reports that he does not drink alcohol or use drugs.  Allergies  Allergen Reactions  . Statins Rash    No family history on file.   Prior to Admission medications   Medication Sig Start Date End Date Taking? Authorizing Provider  carvedilol (COREG) 3.125 MG tablet Take 1 tablet (3.125 mg total) by mouth 2 (  two) times daily. 10/14/15  Yes Antoine Poche, MD  furosemide (LASIX) 40 MG tablet Take 1 tablet (40 mg total) by mouth daily. Patient taking differently: Take 10 mg by mouth every other day. Takes as directed upon weighing. Takes when weight is 155 or higher 10/14/15  Yes Antoine Poche, MD  levothyroxine (SYNTHROID, LEVOTHROID) 112 MCG tablet Take 112 mcg by mouth daily.     Yes Historical Provider, MD  Multiple Vitamin (MULTIVITAMIN WITH MINERALS) TABS tablet Take 0.5 tablets by mouth daily.   Yes Historical Provider, MD  warfarin (COUMADIN) 2 MG tablet TAKE ONE TABLET BY MOUTH ONCE DAILY EXCEPT ONE & ONE-HALF TAB DAILY ON SUNDAYS, TUESDAYS, AND THURSDAYS Patient taking differently: Take 2-3 mg by mouth every evening. Take one and one-half tablet on all days except on Sunday, take one tablet 04/12/16  Yes Antoine Poche, MD    Physical Exam: Vitals:   11/07/16 1826 11/07/16 2031 11/07/16 2213 11/07/16 2214  BP:  117/67 117/75   Pulse:  70 71   Resp:  18 18   Temp:  98.8 F (37.1 C)    TempSrc:  Oral    SpO2:  95% 94% 95%  Weight: 74.8 kg (165 lb)     Height: 5\' 11"  (1.803 m)         Constitutional: NAD, calm, comfortable Vitals:   11/07/16 1826 11/07/16 2031 11/07/16 2213 11/07/16 2214  BP:  117/67 117/75   Pulse:  70 71   Resp:  18 18   Temp:  98.8 F (37.1 C)    TempSrc:  Oral    SpO2:  95% 94% 95%  Weight: 74.8 kg (165 lb)     Height: 5\' 11"  (1.803 m)      Eyes: PERRL, lids and conjunctivae normal ENMT: Mucous membranes are moist. Posterior pharynx clear of any exudate or lesions.Normal dentition.  Neck: normal, supple, no masses, no thyromegaly Respiratory: He has bilateral wheezing  to auscultation no crackles. Normal respiratory effort. No accessory muscle use.  Cardiovascular: Regular rate and rhythm, no murmurs / rubs / gallops. No extremity edema. 2+ pedal pulses. No carotid bruits.  Abdomen: no tenderness, no masses palpated. No hepatosplenomegaly. Bowel sounds positive.  Musculoskeletal: no clubbing / cyanosis. No joint deformity upper and lower extremities. Good ROM, no contractures. Normal muscle tone.  Skin: no rashes, lesions, ulcers. No induration Neurologic: CN 2-12 grossly intact. Sensation intact, DTR normal. Strength 5/5 in  all 4.  Psychiatric: Normal judgment and insight. Alert and oriented x 3. Normal mood.    Labs on Admission: I have personally reviewed following labs and imaging studies  CBC:  Recent Labs Lab 11/07/16 2138  WBC 5.3  NEUTROABS 3.6  HGB 13.8  HCT 41.4  MCV 95.0  PLT 95*   Basic Metabolic Panel:  Recent Labs Lab 11/07/16 2138  NA 139  K 4.9  CL 105  CO2 25  GLUCOSE 112*  BUN 30*  CREATININE 1.57*  CALCIUM 8.8*   GFR: Estimated Creatinine Clearance: 31.8 mL/min (by C-G formula based on SCr of 1.57 mg/dL (H)). Liver Function Tests:  Recent Labs Lab 11/07/16 2138  AST 63*  ALT 31  ALKPHOS 102  BILITOT 1.8*  PROT 7.1  ALBUMIN 4.0    Recent Labs Lab 11/07/16 2138  LIPASE 13   No results for input(s): AMMONIA in the last 168 hours. Coagulation Profile:  Recent Labs Lab 11/01/16 1252  INR 2.6   Cardiac Enzymes:  Recent Labs  Lab 11/07/16 2138  TROPONINI 0.06*   Urine analysis:    Component Value Date/Time   COLORURINE AMBER (A) 11/07/2016 2129   APPEARANCEUR HAZY (A) 11/07/2016 2129   LABSPEC 1.020 11/07/2016 2129   PHURINE 5.0 11/07/2016 2129   GLUCOSEU NEGATIVE 11/07/2016 2129   HGBUR MODERATE (A) 11/07/2016 2129   BILIRUBINUR NEGATIVE 11/07/2016 2129   KETONESUR NEGATIVE 11/07/2016 2129   PROTEINUR 30 (A) 11/07/2016 2129   UROBILINOGEN 2.0 (H) 05/31/2011 1916   NITRITE NEGATIVE 11/07/2016 2129   LEUKOCYTESUR NEGATIVE 11/07/2016 2129   Radiological Exams on Admission: Dg Chest 2 View  Result Date: 11/07/2016 CLINICAL DATA:  Cough and shortness of breath for 3 days. History of CHF, atrial fibrillation. EXAM: CHEST  2 VIEW COMPARISON:  Chest radiograph May 01, 2012 FINDINGS: Cardiac silhouette is mildly enlarged unchanged. Stable prominent pulmonary hila. Tortuous, possibly ectatic aorta. Status post median sternotomy for CABG, chronically fractured most caudal wire. Three lead LEFT cardiac defibrillator in situ. Mild chronic interstitial  changes with strandy densities of the lung bases. No pleural effusion or focal consolidation. No pneumothorax. Osteopenia. Soft tissue planes are nonsuspicious. IMPRESSION: Mild chronic interstitial changes and bibasilar atelectasis/ scarring. Mild cardiomegaly. Similar fullness of the hila most compatible with enlarged pulmonary artery and pulmonary arterial hypertension. Electronically Signed   By: Awilda Metroourtnay  Bloomer M.D.   On: 11/07/2016 19:19    EKG: Independently reviewed.   Assessment/Plan Active Problems:   Hypothyroidism   Hyperlipidemia   Chronic anticoagulation   Biventricular cardiac pacemaker in situ   Chronic kidney disease, stage III (moderate)   Atrial fibrillation (HCC)   Wheezing    PLAN:   Wheezing:  Will continue with nebs.  I think he will benefit getting IV steroids, so will give 40mg  IV Q 6 hours.    Elevated Troponin:  I don't think he has ACS.  Will continue with ASA, and will cycle his troponins.    Hypothyrodism:  Will continue with supplements.  CKD:  Stage III, avoid nephrotoxic drug.   AFib:  Rate is controlled.  Will continue with meds.  Continue with coumadin per pharmacy dosing.    DVT prophylaxis: Coumadin.  Code Status: FULL CODE.  Family Communication: wife at bedside.  Disposition Plan: to home when better.  Consults called: None. Admission status: OBS.    Keah Lamba MD FACP. [facility]  If 7PM-7AM, please contact night-coverage [email or web address] Password [email or web address]  11/08/2016, 12:22 AM

## 2016-11-09 LAB — URINE CULTURE: Culture: NO GROWTH

## 2016-11-16 ENCOUNTER — Encounter: Payer: Self-pay | Admitting: Internal Medicine

## 2016-11-16 ENCOUNTER — Ambulatory Visit (INDEPENDENT_AMBULATORY_CARE_PROVIDER_SITE_OTHER): Payer: Medicare Other | Admitting: Internal Medicine

## 2016-11-16 VITALS — BP 122/72 | HR 74 | Ht 71.0 in | Wt 168.0 lb

## 2016-11-16 DIAGNOSIS — Z950 Presence of cardiac pacemaker: Secondary | ICD-10-CM

## 2016-11-16 DIAGNOSIS — I442 Atrioventricular block, complete: Secondary | ICD-10-CM

## 2016-11-16 DIAGNOSIS — I5022 Chronic systolic (congestive) heart failure: Secondary | ICD-10-CM

## 2016-11-16 DIAGNOSIS — I482 Chronic atrial fibrillation, unspecified: Secondary | ICD-10-CM

## 2016-11-16 LAB — CUP PACEART INCLINIC DEVICE CHECK
Implantable Lead Implant Date: 20070530
Implantable Lead Implant Date: 20070530
Implantable Lead Location: 753858
Implantable Lead Location: 753860
Implantable Lead Model: 4087
Implantable Lead Serial Number: 166565
Lead Channel Impedance Value: 467 Ohm
Lead Channel Impedance Value: 547 Ohm
Lead Channel Pacing Threshold Amplitude: 0.7 V
Lead Channel Pacing Threshold Pulse Width: 0.4 ms
Lead Channel Setting Pacing Amplitude: 2 V
Lead Channel Setting Pacing Amplitude: 2.4 V
Lead Channel Setting Pacing Pulse Width: 0.4 ms
Lead Channel Setting Sensing Sensitivity: 2.5 mV
MDC IDC LEAD IMPLANT DT: 20070530
MDC IDC LEAD LOCATION: 753859
MDC IDC LEAD SERIAL: 271301
MDC IDC MSMT LEADCHNL LV PACING THRESHOLD AMPLITUDE: 0.7 V
MDC IDC MSMT LEADCHNL LV PACING THRESHOLD PULSEWIDTH: 0.4 ms
MDC IDC MSMT LEADCHNL RA IMPEDANCE VALUE: 517 Ohm
MDC IDC MSMT LEADCHNL RA SENSING INTR AMPL: 0.8 mV
MDC IDC PG IMPLANT DT: 20130828
MDC IDC SESS DTM: 20180315040000
MDC IDC SET LEADCHNL RV PACING PULSEWIDTH: 0.4 ms
MDC IDC SET LEADCHNL RV SENSING SENSITIVITY: 2.5 mV
Pulse Gen Serial Number: 100087

## 2016-11-16 MED ORDER — AZITHROMYCIN 250 MG PO TABS: ORAL_TABLET | ORAL | 0 refills | Status: DC

## 2016-11-16 NOTE — Progress Notes (Signed)
HPI Mr. Cedric FishmanSharpe returns today for followup. He is a very pleasant 83 year old man with a long-standing ischemic cardiomyopathy, chronic atrial fibrillation, complete heart block, status post ICD implantation. After his device reached elective replacement, he was downgraded to a a biventricular pacemaker, and continues to do quite well. He denies chest pain, shortness of breath, or syncope. No peripheral edema. He was in the hospital with a cough over a week ago. Since then he has continued to cough. No fever or chills. Allergies  Allergen Reactions  . Statins Rash     Current Outpatient Prescriptions  Medication Sig Dispense Refill  . carvedilol (COREG) 3.125 MG tablet Take 1 tablet (3.125 mg total) by mouth 2 (two) times daily. 180 tablet 3  . levothyroxine (SYNTHROID, LEVOTHROID) 112 MCG tablet Take 112 mcg by mouth daily.      . Multiple Vitamin (MULTIVITAMIN WITH MINERALS) TABS tablet Take 0.5 tablets by mouth daily.    . predniSONE (DELTASONE) 10 MG tablet Take 1 tablet (10 mg total) by mouth daily with breakfast. Take 6 tablets today and then decrease by 1 tablet daily until none are left. 21 tablet 0  . torsemide (DEMADEX) 20 MG tablet Take 20 mg by mouth daily.    Marland Kitchen. warfarin (COUMADIN) 2 MG tablet TAKE ONE TABLET BY MOUTH ONCE DAILY EXCEPT ONE & ONE-HALF TAB DAILY ON SUNDAYS, TUESDAYS, AND THURSDAYS (Patient taking differently: Take 2-3 mg by mouth every evening. Take one and one-half tablet on all days except on Sunday, take one tablet) 90 tablet 3   No current facility-administered medications for this visit.      Past Medical History:  Diagnosis Date  . AICD (automatic cardioverter/defibrillator) present   . Arteriosclerotic cardiovascular disease (ASCVD) 2003   status post CABG surgery-2003 following AMI; ischemic cardiomyopathy with an EF of 25%; s/p AICD implantation-2007; h/o LBBB; possible apical thrombus   . CHF (congestive heart failure) (HCC)   . Chronic anticoagulation     continued for both PAF & possible apical mural thrombus.  . Coronary artery disease   . Hyperlipidemia   . Hypertension   . Hypothyroid   . Mitral regurgitation    Mild by echo in 08/2007 but moderate to severe in 09/2008  . Other primary cardiomyopathies   . Paroxysmal atrial fibrillation (HCC) 2003   Subsequently permanent    ROS:   All systems reviewed and negative except as noted in the HPI.   Past Surgical History:  Procedure Laterality Date  . BI-VENTRICULAR PACEMAKER INSERTION N/A 05/01/2012   Procedure: BI-VENTRICULAR PACEMAKER INSERTION (CRT-P);  Surgeon: Marinus MawGregg W Cassian Torelli, MD;  [facility];  Service: Cardiovascular;  Laterality: N/A;  . CARDIAC DEFIBRILLATOR PLACEMENT  2007  . CORONARY ARTERY BYPASS GRAFT  2003  . INSERT / REPLACE / REMOVE PACEMAKER       History reviewed. No pertinent family history.   Social History   Social History  . Marital status: Married    Spouse name: N/A  . Number of children: N/A  . Years of education: N/A   Occupational History  . Not on file.   Social History Main Topics  . Smoking status: Never Smoker  . Smokeless tobacco: Never Used  . Alcohol use No  . Drug use: No  . Sexual activity: Not Currently   Other Topics Concern  . Not on file   Social History Narrative  . No narrative on file     BP 122/72   Pulse 74   Ht  5\' 11"  (1.803 m)   Wt 168 lb (76.2 kg)   SpO2 94%   BMI 23.43 kg/m   Physical Exam:  Well appearing elderly man, NAD HEENT: Unremarkable Neck:  7 cm JVD, no thyromegally Back:  No CVA tenderness Lungs:  Rales, rhonchi and egophony in the right base about half way up. Left side is clear. HEART:  Regular rate rhythm, no murmurs, no rubs, no clicks Abd:  soft, positive bowel sounds, no organomegally, no rebound, no guarding Ext:  2 plus pulses, no edema, no cyanosis, no clubbing Skin:  No rashes no nodules Neuro:  CN II through XII intact, motor grossly intact  DEVICE  Normal  device function.  See PaceArt for details.   Assess/Plan: 1. Atrial fib - his ventricular rate is well controlled. He has underlying CHB. 2. Chronic systolic heart failure - his symptoms remain class 2. He is encouraged to maintain a low sodium diet. 3. PPM - his [place] BiV PPM is working normally. Will follow. 4. Pneumonia - his exam suggests pneumonia in the right lower lobe. I have asked him to start a Z-pack.  Leonia Reeves.D

## 2016-11-16 NOTE — Progress Notes (Signed)
HPI [name] returns today for followup. He is a very pleasant 83 year old man with a long-standing ischemic cardiomyopathy, chronic atrial fibrillation, complete heart block, status post ICD implantation. After his device reached elective replacement, he was downgraded to a a biventricular pacemaker, and continues to do quite well. He denies chest pain, shortness of breath, or syncope. No peripheral edema. 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 He does have some chronic renal insufficiency requiring removal of his ARB. Allergies  Allergen Reactions  . Statins Rash     Current Outpatient Prescriptions  Medication Sig Dispense Refill  . carvedilol (COREG) 3.125 MG tablet Take 1 tablet (3.125 mg total) by mouth 2 (two) times daily. 180 tablet 3  . levothyroxine (SYNTHROID, LEVOTHROID) 112 MCG tablet Take 112 mcg by mouth daily.      . Multiple Vitamin (MULTIVITAMIN WITH MINERALS) TABS tablet Take 0.5 tablets by mouth daily.    . predniSONE (DELTASONE) 10 MG tablet Take 1 tablet (10 mg total) by mouth daily with breakfast. Take 6 tablets today and then decrease by 1 tablet daily until none are left. 21 tablet 0  . torsemide (DEMADEX) 20 MG tablet Take 20 mg by mouth daily.    Marland Kitchen. warfarin (COUMADIN) 2 MG tablet TAKE ONE TABLET BY MOUTH ONCE DAILY EXCEPT ONE & ONE-HALF TAB DAILY ON SUNDAYS, TUESDAYS, AND THURSDAYS (Patient taking differently: Take 2-3 mg by mouth every evening. Take one and one-half tablet on all days except on Sunday, take one tablet) 90 tablet 3   No current facility-administered medications for this visit.      Past Medical History:  Diagnosis Date  . AICD (automatic cardioverter/defibrillator) present   . Arteriosclerotic cardiovascular disease (ASCVD) 2003   status post CABG surgery-2003 following AMI; ischemic cardiomyopathy with an EF of 25%; s/p AICD implantation-2007; h/o LBBB; possible apical thrombus   . CHF (congestive heart failure) (HCC)   . Chronic  anticoagulation    continued for both PAF & possible apical mural thrombus.  . Coronary artery disease   . Hyperlipidemia   . Hypertension   . Hypothyroid   . Mitral regurgitation    Mild by echo in 08/2007 but moderate to severe in 09/2008  . Other primary cardiomyopathies   . Paroxysmal atrial fibrillation (HCC) 2003   Subsequently permanent    ROS:   All systems reviewed and negative except as noted in the HPI.   Past Surgical History:  Procedure Laterality Date  . BI-VENTRICULAR PACEMAKER INSERTION N/A 05/01/2012   Procedure: BI-VENTRICULAR PACEMAKER INSERTION (CRT-P);  Surgeon: Marinus MawGregg W Taylor, MD;  [facility];  Service: Cardiovascular;  Laterality: N/A;  . CARDIAC DEFIBRILLATOR PLACEMENT  2007  . CORONARY ARTERY BYPASS GRAFT  2003  . INSERT / REPLACE / REMOVE PACEMAKER       History reviewed. No pertinent family history.   Social History   Social History  . Marital status: Married    Spouse name: N/A  . Number of children: N/A  . Years of education: N/A   Occupational History  . Not on file.   Social History Main Topics  . Smoking status: Never Smoker  . Smokeless tobacco: Never Used  . Alcohol use No  . Drug use: No  . Sexual activity: Not Currently   Other Topics Concern  . Not on file   Social History Narrative  . No narrative on file     BP 122/72   Pulse 74   Ht 5\' 11"  (1.803 m)  Wt 168 lb (76.2 kg)   SpO2 94%   BMI 23.43 kg/m   Physical Exam:  Well appearing elderly man, NAD HEENT: Unremarkable Neck:  7 cm JVD, no thyromegally Back:  No CVA tenderness Lungs:  Clear with no wheezes, rales, or rhonchi. HEART:  Regular rate rhythm, no murmurs, no rubs, no clicks Abd:  soft, positive bowel sounds, no organomegally, no rebound, no guarding Ext:  2 plus pulses, no edema, no cyanosis, no clubbing Skin:  No rashes no nodules Neuro:  CN II through XII intact, motor grossly intact  DEVICE  Normal device function.  See PaceArt  for details.   Assess/Plan: 1. Atrial fib - his ventricular rate is well controlled. He has underlying CHB. 2. Chronic systolic heart failure - his symptoms remain class 2. He is encouraged to maintain a low sodium diet. 3. PPM - his [place] BiV PPM is working normally. Will follow.  Leonia Reeves.D

## 2016-11-16 NOTE — Patient Instructions (Signed)
Your physician wants you to follow-up in: 1 Year with Dr. Ladona Ridgelaylor. You will receive a reminder letter in the mail two months in advance. If you don't receive a letter, please call our office to schedule the follow-up appointment.  Your physician wants you to follow-up in: 6 Months with the [facility]. You will receive a reminder letter in the mail two months in advance. If you don't receive a letter, please call our office to schedule the follow-up appointment.  Your physician recommends that you continue on your current medications as directed. Please refer to the Current Medication list given to you today.  If you need a refill on your cardiac medications before your next appointment, please call your pharmacy.  Thank you for choosing [facility]!

## 2016-11-30 ENCOUNTER — Encounter (HOSPITAL_COMMUNITY): Payer: Self-pay

## 2016-11-30 ENCOUNTER — Emergency Department (HOSPITAL_COMMUNITY): Payer: Medicare Other

## 2016-11-30 ENCOUNTER — Emergency Department (HOSPITAL_COMMUNITY)
Admission: EM | Admit: 2016-11-30 | Discharge: 2016-11-30 | Disposition: A | Discharge status: Home / Self Care | Payer: Medicare Other | Attending: Emergency Medicine | Admitting: Emergency Medicine

## 2016-11-30 DIAGNOSIS — I2510 Atherosclerotic heart disease of native coronary artery without angina pectoris: Secondary | ICD-10-CM | POA: Diagnosis not present

## 2016-11-30 DIAGNOSIS — I5022 Chronic systolic (congestive) heart failure: Secondary | ICD-10-CM | POA: Diagnosis not present

## 2016-11-30 DIAGNOSIS — W0110XA Fall on same level from slipping, tripping and stumbling with subsequent striking against unspecified object, initial encounter: Secondary | ICD-10-CM | POA: Diagnosis not present

## 2016-11-30 DIAGNOSIS — S0091XA Abrasion of unspecified part of head, initial encounter: Secondary | ICD-10-CM

## 2016-11-30 DIAGNOSIS — E039 Hypothyroidism, unspecified: Secondary | ICD-10-CM | POA: Diagnosis not present

## 2016-11-30 DIAGNOSIS — Y929 Unspecified place or not applicable: Secondary | ICD-10-CM | POA: Diagnosis not present

## 2016-11-30 DIAGNOSIS — Y9301 Activity, walking, marching and hiking: Secondary | ICD-10-CM | POA: Insufficient documentation

## 2016-11-30 DIAGNOSIS — I130 Hypertensive heart and chronic kidney disease with heart failure and stage 1 through stage 4 chronic kidney disease, or unspecified chronic kidney disease: Secondary | ICD-10-CM | POA: Insufficient documentation

## 2016-11-30 DIAGNOSIS — Z7901 Long term (current) use of anticoagulants: Secondary | ICD-10-CM | POA: Diagnosis not present

## 2016-11-30 DIAGNOSIS — W19XXXA Unspecified fall, initial encounter: Secondary | ICD-10-CM

## 2016-11-30 DIAGNOSIS — S0990XA Unspecified injury of head, initial encounter: Secondary | ICD-10-CM | POA: Diagnosis present

## 2016-11-30 DIAGNOSIS — S0081XA Abrasion of other part of head, initial encounter: Secondary | ICD-10-CM | POA: Insufficient documentation

## 2016-11-30 DIAGNOSIS — Y999 Unspecified external cause status: Secondary | ICD-10-CM | POA: Insufficient documentation

## 2016-11-30 DIAGNOSIS — N183 Chronic kidney disease, stage 3 (moderate): Secondary | ICD-10-CM | POA: Diagnosis not present

## 2016-11-30 DIAGNOSIS — R079 Chest pain, unspecified: Secondary | ICD-10-CM

## 2016-11-30 LAB — BASIC METABOLIC PANEL
Anion gap: 7 (ref 5–15)
BUN: 33 mg/dL — AB (ref 6–20)
CALCIUM: 8.7 mg/dL — AB (ref 8.9–10.3)
CO2: 29 mmol/L (ref 22–32)
CREATININE: 1.64 mg/dL — AB (ref 0.61–1.24)
Chloride: 105 mmol/L (ref 101–111)
GFR calc Af Amer: 40 mL/min — ABNORMAL LOW (ref >60)
GFR, EST NON AFRICAN AMERICAN: 35 mL/min — AB (ref >60)
Glucose, Bld: 87 mg/dL (ref 65–99)
Potassium: 4.2 mmol/L (ref 3.5–5.1)
Sodium: 141 mmol/L (ref 135–145)

## 2016-11-30 LAB — CBC WITH DIFFERENTIAL/PLATELET
BASOS ABS: 0 10*3/uL (ref 0.0–0.1)
Basophils Relative: 1 %
EOS PCT: 5 %
Eosinophils Absolute: 0.2 10*3/uL (ref 0.0–0.7)
HEMATOCRIT: 41.2 % (ref 39.0–52.0)
Hemoglobin: 13.5 g/dL (ref 13.0–17.0)
LYMPHS PCT: 40 %
Lymphs Abs: 1.7 10*3/uL (ref 0.7–4.0)
MCH: 31.3 pg (ref 26.0–34.0)
MCHC: 32.8 g/dL (ref 30.0–36.0)
MCV: 95.6 fL (ref 78.0–100.0)
MONO ABS: 0.3 10*3/uL (ref 0.1–1.0)
MONOS PCT: 8 %
NEUTROS ABS: 2 10*3/uL (ref 1.7–7.7)
Neutrophils Relative %: 47 %
PLATELETS: ADEQUATE 10*3/uL (ref 150–400)
RBC: 4.31 MIL/uL (ref 4.22–5.81)
RDW: 14.7 % (ref 11.5–15.5)
WBC: 4.2 10*3/uL (ref 4.0–10.5)

## 2016-11-30 LAB — TROPONIN I: Troponin I: 0.03 ng/mL (ref <0.03)

## 2016-11-30 LAB — PROTIME-INR
INR: 2.39
Prothrombin Time: 26.5 seconds — ABNORMAL HIGH (ref 11.4–15.2)

## 2016-11-30 NOTE — ED Notes (Signed)
CRITICAL VALUE ALERT  Critical value received:  Troponin 0.03  Date of notification:  3 29 18   Time of notification:  1333  Critical value read back:Yes.    Nurse who received alert:  Mandi  MD notified (1st page):  Adriana Simasook  Time of first page:  1335  MD notified (2nd page):  Time of second page:  Responding MD:  Adriana Simasook  Time MD responded:  [phone]

## 2016-11-30 NOTE — ED Triage Notes (Signed)
Pt states he got his foot hung on vine and fell hitting left side of head. Denies LOC. States as he was getting up his chest starting hurting. Currently in coumadin

## 2016-11-30 NOTE — ED Provider Notes (Signed)
[facility]-EMERGENCY DEPT Provider Note   CSN: [id number] Arrival date & time: 11/30/16  1111   By signing my name below, I, Bobbie Stackhristopher Reid, attest that this documentation has been prepared under the direction and in the presence of Donnetta HutchingBrian Meric Joye, MD. Electronically Signed: Bobbie Stackhristopher Reid, Scribe. 11/30/16. 12:03 PM. History   Chief Complaint Chief Complaint  Patient presents with  . Fall    The history is provided by the patient. No language interpreter was used.  HPI Comments: [name] is a 83 y.o. male who presents to the Emergency Department complaining of left-sided chest pain s/p fall that occurred prior to his arrival. The patient states that he was walking and his foot hung on some vines and he fell. He states that he hit his head but denies LOC and headache. The patient states that his pain and chest tightness began immediately after the fall. The patient has a hx of 7 vessel CABG in 2003. He also has a pacemaker. He denies SOB. He is currently on coumadin.   Past Medical History:  Diagnosis Date  . AICD (automatic cardioverter/defibrillator) present   . Arteriosclerotic cardiovascular disease (ASCVD) 2003   status post CABG surgery-2003 following AMI; ischemic cardiomyopathy with an EF of 25%; s/p AICD implantation-2007; h/o LBBB; possible apical thrombus   . CHF (congestive heart failure) (HCC)   . Chronic anticoagulation    continued for both PAF & possible apical mural thrombus.  . Coronary artery disease   . Hyperlipidemia   . Hypertension   . Hypothyroid   . Mitral regurgitation    Mild by echo in 08/2007 but moderate to severe in 09/2008  . Other primary cardiomyopathies   . Paroxysmal atrial fibrillation (HCC) 2003   Subsequently permanent    Patient Active Problem List   Diagnosis Date Noted  . Wheezing 11/08/2016  . Encounter for therapeutic drug monitoring 10/23/2013  . Chronic systolic heart failure (HCC) 05/20/2013  . Skin macule or macular rash  05/20/2013  . Atrial fibrillation (HCC) 12/05/2012  . Chronic kidney disease, stage III (moderate) 10/25/2012  . Thrombocytopenia-resolved 05/31/2011  . Hypertension   . Arteriosclerotic cardiovascular disease (ASCVD)   . Biventricular cardiac pacemaker in situ   . Chronic anticoagulation 11/19/2010  . Hypothyroidism 08/31/2010  . Hyperlipidemia 08/31/2010    Past Surgical History:  Procedure Laterality Date  . BI-VENTRICULAR PACEMAKER INSERTION N/A 05/01/2012   Procedure: BI-VENTRICULAR PACEMAKER INSERTION (CRT-P);  Surgeon: Marinus MawGregg W Taylor, MD;  [facility];  Service: Cardiovascular;  Laterality: N/A;  . CARDIAC DEFIBRILLATOR PLACEMENT  2007  . CORONARY ARTERY BYPASS GRAFT  2003  . INSERT / REPLACE / REMOVE PACEMAKER         Home Medications    Prior to Admission medications   Medication Sig Start Date End Date Taking? Authorizing Provider  carvedilol (COREG) 3.125 MG tablet Take 1 tablet (3.125 mg total) by mouth 2 (two) times daily. 10/14/15  Yes Antoine PocheJonathan F Branch, MD  levothyroxine (SYNTHROID, LEVOTHROID) 112 MCG tablet Take 112 mcg by mouth daily.     Yes Historical Provider, MD  Multiple Vitamin (MULTIVITAMIN WITH MINERALS) TABS tablet Take 0.5 tablets by mouth daily.   Yes Historical Provider, MD  torsemide (DEMADEX) 20 MG tablet Take 20 mg by mouth daily. Only takes it when his weight goes up a pound or 2   Yes Historical Provider, MD  warfarin (COUMADIN) 2 MG tablet TAKE ONE TABLET BY MOUTH ONCE DAILY EXCEPT ONE & ONE-HALF TAB DAILY ON  SUNDAYS, TUESDAYS, AND THURSDAYS Patient taking differently: Take 2-3 mg by mouth every evening. Take one and one-half tablet on all days except on Sunday, take one tablet 04/12/16  Yes Antoine Poche, MD  azithromycin (ZITHROMAX) 250 MG tablet Take 2 Tablets (500 mg) on Day 1, Then Take 1 Tablet (250 mg)  on Days 2 - Day 5 11/16/16   Marinus Maw, MD  predniSONE (DELTASONE) 10 MG tablet Take 1 tablet (10 mg total) by mouth daily  with breakfast. Take 6 tablets today and then decrease by 1 tablet daily until none are left. 11/08/16   Estela Isaiah Blakes, MD    Family History No family history on file.  Social History Social History  Substance Use Topics  . Smoking status: Never Smoker  . Smokeless tobacco: Never Used  . Alcohol use No     Allergies   Statins   Review of Systems Review of Systems A complete 10 system review of systems was obtained and all systems are negative except as noted in the HPI and PMH.   Physical Exam Updated Vital Signs BP (!) 141/98 (BP Location: Left Arm)   Pulse 71   Temp 98.1 F (36.7 C) (Oral)   Resp (!) 21   Ht 5\' 11"  (1.803 m)   Wt 165 lb (74.8 kg)   SpO2 95%   BMI 23.01 kg/m   Physical Exam  Constitutional: He is oriented to person, place, and time. He appears well-developed and well-nourished.  HENT:  Head: Normocephalic. Head is with abrasion.  Abrasion on his left frontal-temporal area.  Eyes: Conjunctivae are normal.  Neck: Neck supple.  Cardiovascular: Normal rate and regular rhythm.   Pulmonary/Chest: Effort normal and breath sounds normal.  Abdominal: Soft. Bowel sounds are normal.  Musculoskeletal: Normal range of motion.  Neurological: He is alert and oriented to person, place, and time.  Skin: Skin is warm and dry.  Psychiatric: He has a normal mood and affect. His behavior is normal.  Nursing note and vitals reviewed.   ED Treatments / Results  DIAGNOSTIC STUDIES: Oxygen Saturation is 100% on RA, normal by my interpretation.    COORDINATION OF CARE: 11:44 AM Discussed treatment plan with pt at bedside and pt agreed to plan. I will check the patient's CXR, EKG, head CT, and troponin.  Labs (all labs ordered are listed, but only abnormal results are displayed) Labs Reviewed  BASIC METABOLIC PANEL - Abnormal; Notable for the following:       Result Value   BUN 33 (*)    Creatinine, Ser 1.64 (*)    Calcium 8.7 (*)    GFR calc non  Af Amer 35 (*)    GFR calc Af Amer 40 (*)    All other components within normal limits  TROPONIN I - Abnormal; Notable for the following:    Troponin I 0.03 (*)    All other components within normal limits  PROTIME-INR - Abnormal; Notable for the following:    Prothrombin Time 26.5 (*)    All other components within normal limits  CBC WITH DIFFERENTIAL/PLATELET    EKG  EKG Interpretation  Date/Time:  Thursday November 30 2016 11:35:33 EDT Ventricular Rate:  70 PR Interval:    QRS Duration: 139 QT Interval:  450 QTC Calculation: 486 R Axis:   -98 Text Interpretation:  Afib/flutter and ventricular-paced rhythm No further analysis attempted due to paced rhythm Baseline wander in lead(s) V4 V6 Confirmed by Regana Kemple  MD, Jamesyn Lindell ([id number])  on 11/30/2016 12:24:13 PM       Radiology Dg Chest 1 View  Result Date: 11/30/2016 CLINICAL DATA:  Tripped and fell on a vine. Hit left-sided head. Chest pain. EXAM: CHEST 1 VIEW COMPARISON:  Two-view chest x-ray 11/07/2016 FINDINGS: The heart is mildly enlarged. Pacing wires are stable. Aortic atherosclerotic cysts is present. There is no edema or effusion. No focal airspace disease is present. The visualized soft tissues and bony thorax are unremarkable. IMPRESSION: 1. Stable cardiomegaly without failure. 2. No acute cardiopulmonary disease. 3. No acute trauma. Electronically Signed   By: Marin Roberts M.D.   On: 11/30/2016 12:34   Ct Head Wo Contrast  Result Date: 11/30/2016 CLINICAL DATA:  Fall with head injury. EXAM: CT HEAD WITHOUT CONTRAST TECHNIQUE: Contiguous axial images were obtained from the base of the skull through the vertex without intravenous contrast. COMPARISON:  None. FINDINGS: Brain: Symmetric mild prominence of the CSF spaces over the anterior frontal convexities is favored to be due to generalized cerebral volume loss. No evidence of parenchymal hemorrhage or extra-axial fluid collection. No mass lesion, mass effect, or midline shift.  No CT evidence of acute infarction. Nonspecific mild subcortical and periventricular white matter hypodensity, most in keeping with chronic small vessel ischemic change. Generalized cerebral volume loss. No ventriculomegaly. Vascular: Intracranial atherosclerosis.  No acute abnormality. Skull: No evidence of calvarial fracture. Sinuses/Orbits: Mucoperiosteal thickening in the bilateral ethmoidal air cells and left maxillary sinus. No fluid levels in the visualized paranasal sinuses. Other: Tiny anterior left frontal scalp contusion. The mastoid air cells are unopacified. IMPRESSION: 1. Tiny anterior left frontal scalp contusion. 2. No evidence of acute intracranial abnormality. No evidence of calvarial fracture. 3. Mild chronic small vessel ischemic change. 4. Paranasal sinusitis, probably chronic. Electronically Signed   By: Delbert Phenix M.D.   On: 11/30/2016 12:33    Procedures Procedures (including critical care time)  Medications Ordered in ED Medications - No data to display   Initial Impression / Assessment and Plan / ED Course  I have reviewed the triage vital signs and the nursing notes.  Pertinent labs & imaging results that were available during my care of the patient were reviewed by me and considered in my medical decision making (see chart for details).    Patient is alert and feeling better. EKG, CXR, CT head show no acute findings. INR appropriate. Will discharge home.   Final Clinical Impressions(s) / ED Diagnoses   Final diagnoses:  Fall, initial encounter  Abrasion of head, initial encounter    New Prescriptions New Prescriptions   No medications on file   I personally performed the services described in this documentation, which was scribed in my presence. The recorded information has been reviewed and is accurate.     Donnetta Hutching, MD 11/30/16 [phone]

## 2016-11-30 NOTE — Discharge Instructions (Signed)
Tests showed no life-threatening condition. Rest. Ice pack to head. Tylenol for pain.

## 2016-12-13 ENCOUNTER — Ambulatory Visit (INDEPENDENT_AMBULATORY_CARE_PROVIDER_SITE_OTHER): Payer: Medicare Other | Admitting: *Deleted

## 2016-12-13 DIAGNOSIS — Z5181 Encounter for therapeutic drug level monitoring: Secondary | ICD-10-CM

## 2016-12-13 DIAGNOSIS — I480 Paroxysmal atrial fibrillation: Secondary | ICD-10-CM | POA: Diagnosis not present

## 2016-12-13 LAB — POCT INR: INR: 2.3

## 2016-12-19 ENCOUNTER — Other Ambulatory Visit: Payer: Self-pay | Admitting: Cardiology

## 2017-01-24 ENCOUNTER — Ambulatory Visit (INDEPENDENT_AMBULATORY_CARE_PROVIDER_SITE_OTHER): Payer: Medicare Other | Admitting: *Deleted

## 2017-01-24 DIAGNOSIS — Z5181 Encounter for therapeutic drug level monitoring: Secondary | ICD-10-CM | POA: Diagnosis not present

## 2017-01-24 DIAGNOSIS — I480 Paroxysmal atrial fibrillation: Secondary | ICD-10-CM

## 2017-01-24 LAB — POCT INR: INR: 2

## 2017-03-14 ENCOUNTER — Ambulatory Visit (INDEPENDENT_AMBULATORY_CARE_PROVIDER_SITE_OTHER): Payer: Medicare Other | Admitting: *Deleted

## 2017-03-14 DIAGNOSIS — Z5181 Encounter for therapeutic drug level monitoring: Secondary | ICD-10-CM | POA: Diagnosis not present

## 2017-03-14 DIAGNOSIS — I480 Paroxysmal atrial fibrillation: Secondary | ICD-10-CM | POA: Diagnosis not present

## 2017-03-14 LAB — POCT INR: INR: 3.2

## 2017-04-04 ENCOUNTER — Ambulatory Visit (INDEPENDENT_AMBULATORY_CARE_PROVIDER_SITE_OTHER): Payer: Medicare Other | Admitting: *Deleted

## 2017-04-04 DIAGNOSIS — Z5181 Encounter for therapeutic drug level monitoring: Secondary | ICD-10-CM

## 2017-04-04 DIAGNOSIS — I480 Paroxysmal atrial fibrillation: Secondary | ICD-10-CM | POA: Diagnosis not present

## 2017-04-04 LAB — POCT INR: INR: 3.2

## 2017-04-25 ENCOUNTER — Ambulatory Visit (INDEPENDENT_AMBULATORY_CARE_PROVIDER_SITE_OTHER): Payer: Medicare Other | Admitting: *Deleted

## 2017-04-25 DIAGNOSIS — Z5181 Encounter for therapeutic drug level monitoring: Secondary | ICD-10-CM | POA: Diagnosis not present

## 2017-04-25 DIAGNOSIS — I480 Paroxysmal atrial fibrillation: Secondary | ICD-10-CM | POA: Diagnosis not present

## 2017-04-25 LAB — POCT INR: INR: 2.3

## 2017-05-23 ENCOUNTER — Encounter: Payer: Self-pay | Admitting: Cardiology

## 2017-05-23 ENCOUNTER — Ambulatory Visit (INDEPENDENT_AMBULATORY_CARE_PROVIDER_SITE_OTHER): Payer: Medicare Other | Admitting: Cardiology

## 2017-05-23 ENCOUNTER — Ambulatory Visit (INDEPENDENT_AMBULATORY_CARE_PROVIDER_SITE_OTHER): Payer: Medicare Other | Admitting: *Deleted

## 2017-05-23 VITALS — BP 108/72 | HR 58 | Ht 70.0 in | Wt 163.0 lb

## 2017-05-23 DIAGNOSIS — Z5181 Encounter for therapeutic drug level monitoring: Secondary | ICD-10-CM | POA: Diagnosis not present

## 2017-05-23 DIAGNOSIS — I4891 Unspecified atrial fibrillation: Secondary | ICD-10-CM | POA: Diagnosis not present

## 2017-05-23 DIAGNOSIS — I5022 Chronic systolic (congestive) heart failure: Secondary | ICD-10-CM | POA: Diagnosis not present

## 2017-05-23 DIAGNOSIS — I442 Atrioventricular block, complete: Secondary | ICD-10-CM | POA: Diagnosis not present

## 2017-05-23 DIAGNOSIS — I480 Paroxysmal atrial fibrillation: Secondary | ICD-10-CM

## 2017-05-23 DIAGNOSIS — I2510 Atherosclerotic heart disease of native coronary artery without angina pectoris: Secondary | ICD-10-CM

## 2017-05-23 LAB — POCT INR: INR: 1.6

## 2017-05-23 NOTE — Progress Notes (Signed)
Clinical Summary [name] is a 83 y.o.male seen today for follow up of the following medical problems.   1. ICM/CAD  - prior CABG in 2003 at [facility]  - LVEF 15-20% by echo 12/2012  - titration of medications has been limited due to hypotension     - no recent SOB/DOE. No recent chest pain - compliant with meds. Can have some orthostatic symptoms. Takes torsemide prn about 5 days a week.  - home weights stable around 150 lbs  2. Chronic afib   - no recent palpitations. Had some recent hematuria that resolved.   3. Complete heart block  - has biventricular pacmemaker, followed by Dr Ladona Ridgel, converted from BiV ICD to BiV pacemaker at last generator change.   - denies any recent lightheadedness or dizziness  4. HL  - prava stopped last visit after developing a rash. .  - tried lovastatin last visit but this also caused rash, he has stopped taking.   - currently not on statin  Past Medical History:  Diagnosis Date  . AICD (automatic cardioverter/defibrillator) present   . Arteriosclerotic cardiovascular disease (ASCVD) 2003   status post CABG surgery-2003 following AMI; ischemic cardiomyopathy with an EF of 25%; s/p AICD implantation-2007; h/o LBBB; possible apical thrombus   . CHF (congestive heart failure) (HCC)   . Chronic anticoagulation    continued for both PAF & possible apical mural thrombus.  . Coronary artery disease   . Hyperlipidemia   . Hypertension   . Hypothyroid   . Mitral regurgitation    Mild by echo in 08/2007 but moderate to severe in 09/2008  . Other primary cardiomyopathies   . Paroxysmal atrial fibrillation (HCC) 2003   Subsequently permanent     Allergies  Allergen Reactions  . Statins Rash     Current Outpatient Prescriptions  Medication Sig Dispense Refill  . azithromycin (ZITHROMAX) 250 MG tablet Take 2 Tablets (500 mg) on Day 1, Then Take 1 Tablet (250 mg)  on Days 2 - Day 5 6 each 0  . carvedilol (COREG)  3.125 MG tablet Take 1 tablet (3.125 mg total) by mouth 2 (two) times daily. 180 tablet 3  . levothyroxine (SYNTHROID, LEVOTHROID) 112 MCG tablet Take 112 mcg by mouth daily.      . Multiple Vitamin (MULTIVITAMIN WITH MINERALS) TABS tablet Take 0.5 tablets by mouth daily.    . predniSONE (DELTASONE) 10 MG tablet Take 1 tablet (10 mg total) by mouth daily with breakfast. Take 6 tablets today and then decrease by 1 tablet daily until none are left. 21 tablet 0  . torsemide (DEMADEX) 20 MG tablet Take 20 mg by mouth daily. Only takes it when his weight goes up a pound or 2    . warfarin (COUMADIN) 2 MG tablet TAKE ONE WHOLE TABLET BY MOUTH DAILY EXCEPT  TAKE  ONE  AND  ONE-HALF  TABLET  DAILY  ON  SUNDAYS,  TUESDAYS  AND  THURSDAYS. 90 tablet 3   No current facility-administered medications for this visit.      Past Surgical History:  Procedure Laterality Date  . BI-VENTRICULAR PACEMAKER INSERTION N/A 05/01/2012   Procedure: BI-VENTRICULAR PACEMAKER INSERTION (CRT-P);  Surgeon: Marinus Maw, MD;  Location: [facility] CATH LAB;  Service: Cardiovascular;  Laterality: N/A;  . CARDIAC DEFIBRILLATOR PLACEMENT  2007  . CORONARY ARTERY BYPASS GRAFT  2003  . INSERT / REPLACE / REMOVE PACEMAKER       Allergies  Allergen Reactions  .  Statins Rash      No family history on file.   Social History Mr. Barrales reports that he has never smoked. He has never used smokeless tobacco. Mr. Yurkovich reports that he does not drink alcohol.   Review of Systems CONSTITUTIONAL: No weight loss, fever, chills, weakness or fatigue.  HEENT: Eyes: No visual loss, blurred vision, double vision or yellow sclerae.No hearing loss, sneezing, congestion, runny nose or sore throat.  SKIN: No rash or itching.  CARDIOVASCULAR: per hpi RESPIRATORY: No shortness of breath, cough or sputum.  GASTROINTESTINAL: No anorexia, nausea, vomiting or diarrhea. No abdominal pain or blood.  GENITOURINARY: No burning on urination, no  polyuria NEUROLOGICAL: No headache, dizziness, syncope, paralysis, ataxia, numbness or tingling in the extremities. No change in bowel or bladder control.  MUSCULOSKELETAL: No muscle, back pain, joint pain or stiffness.  LYMPHATICS: No enlarged nodes. No history of splenectomy.  PSYCHIATRIC: No history of depression or anxiety.  ENDOCRINOLOGIC: No reports of sweating, cold or heat intolerance. No polyuria or polydipsia.  Marland Kitchen   Physical Examination Vitals:   05/23/17 1401  BP: 108/72  Pulse: (!) 58  SpO2: 94%   Vitals:   05/23/17 1401  Weight: 163 lb (73.9 kg)  Height:  (1.778 m)    Gen: resting comfortably, no acute distress HEENT: no scleral icterus, pupils equal round and reactive, no palptable cervical adenopathy,  CV: RRR, no m/r/g, no jvd Resp: Clear to auscultation bilaterally GI: abdomen is soft, non-tender, non-distended, normal bowel sounds, no hepatosplenomegaly MSK: extremities are warm, no edema.  Skin: warm, no rash Neuro:  no focal deficits Psych: appropriate affect   Diagnostic Studies 12/2012 Echo: LVEF 15-20%, mild LVH, multiple WMAs, mild MR, PASP 31    Assessment and Plan  1. ICM/CAD  - titration of medications limited by low blood pressures and orthostatic symptoms  - ICD changed to BiV pacemaker previously, he is followed by EP   - he will continue current meds, no significant symptoms.   2. Chronic afib  - no recent symptoms, continue current meds  3. Complete heart block  - BiV pacemaker followed by EP  4. Hyperlipidemia  - developed rash to both prava and lova, statins stopped.  - continue dietary modification, request labs from pcp      Antoine Poche, M.D.

## 2017-05-23 NOTE — Patient Instructions (Signed)

## 2017-06-13 ENCOUNTER — Ambulatory Visit (INDEPENDENT_AMBULATORY_CARE_PROVIDER_SITE_OTHER): Payer: Medicare Other | Admitting: *Deleted

## 2017-06-13 DIAGNOSIS — I480 Paroxysmal atrial fibrillation: Secondary | ICD-10-CM | POA: Diagnosis not present

## 2017-06-13 DIAGNOSIS — I4891 Unspecified atrial fibrillation: Secondary | ICD-10-CM

## 2017-06-13 DIAGNOSIS — Z5181 Encounter for therapeutic drug level monitoring: Secondary | ICD-10-CM

## 2017-06-13 LAB — POCT INR: INR: 3.9

## 2017-06-27 ENCOUNTER — Encounter: Payer: Self-pay | Admitting: *Deleted

## 2017-08-01 ENCOUNTER — Ambulatory Visit (INDEPENDENT_AMBULATORY_CARE_PROVIDER_SITE_OTHER): Payer: Medicare Other | Admitting: *Deleted

## 2017-08-01 ENCOUNTER — Telehealth: Payer: Self-pay | Admitting: *Deleted

## 2017-08-01 DIAGNOSIS — Z5181 Encounter for therapeutic drug level monitoring: Secondary | ICD-10-CM | POA: Diagnosis not present

## 2017-08-01 DIAGNOSIS — I4891 Unspecified atrial fibrillation: Secondary | ICD-10-CM

## 2017-08-01 DIAGNOSIS — I480 Paroxysmal atrial fibrillation: Secondary | ICD-10-CM

## 2017-08-01 LAB — POCT INR: INR: 2.9

## 2017-08-01 NOTE — Telephone Encounter (Signed)
Pt walked in.  Coumadin checked.

## 2017-08-01 NOTE — Telephone Encounter (Signed)
Pt is scheduled tomorrow to see GT, can he have his coumadin checked then? Please give him a call [phone]

## 2017-08-02 ENCOUNTER — Ambulatory Visit (INDEPENDENT_AMBULATORY_CARE_PROVIDER_SITE_OTHER): Payer: Medicare Other | Admitting: Internal Medicine

## 2017-08-02 ENCOUNTER — Encounter: Payer: Self-pay | Admitting: Internal Medicine

## 2017-08-02 VITALS — BP 116/68 | HR 69 | Ht 73.0 in | Wt 172.0 lb

## 2017-08-02 DIAGNOSIS — I5022 Chronic systolic (congestive) heart failure: Secondary | ICD-10-CM

## 2017-08-02 LAB — CUP PACEART INCLINIC DEVICE CHECK
Date Time Interrogation Session: 20181129050000
Implantable Lead Implant Date: 20070530
Implantable Lead Implant Date: 20070530
Implantable Lead Location: 753859
Implantable Lead Model: 4087
Implantable Lead Serial Number: 166565
Implantable Lead Serial Number: 271301
Implantable Pulse Generator Implant Date: 20130828
Lead Channel Impedance Value: 437 Ohm
Lead Channel Impedance Value: 576 Ohm
Lead Channel Pacing Threshold Amplitude: 0.7 V
Lead Channel Sensing Intrinsic Amplitude: 1 mV
Lead Channel Setting Pacing Amplitude: 2.4 V
Lead Channel Setting Pacing Pulse Width: 0.4 ms
Lead Channel Setting Pacing Pulse Width: 0.4 ms
Lead Channel Setting Sensing Sensitivity: 2.5 mV
Lead Channel Setting Sensing Sensitivity: 2.5 mV
MDC IDC LEAD IMPLANT DT: 20070530
MDC IDC LEAD LOCATION: 753858
MDC IDC LEAD LOCATION: 753860
MDC IDC MSMT LEADCHNL LV IMPEDANCE VALUE: 487 Ohm
MDC IDC MSMT LEADCHNL LV PACING THRESHOLD PULSEWIDTH: 0.4 ms
MDC IDC MSMT LEADCHNL RV PACING THRESHOLD AMPLITUDE: 0.7 V
MDC IDC MSMT LEADCHNL RV PACING THRESHOLD PULSEWIDTH: 0.4 ms
MDC IDC PG SERIAL: 100087
MDC IDC SET LEADCHNL LV PACING AMPLITUDE: 2 V

## 2017-08-02 NOTE — Patient Instructions (Addendum)
Medication Instructions:  Your physician recommends that you continue on your current medications as directed. Please refer to the Current Medication list given to you today.   Labwork: NONE  Testing/Procedures: NONE   Follow-Up: Your physician wants you to follow-up in: 6 Months with Dr. Taylor.  You will receive a reminder letter in the mail two months in advance. If you don't receive a letter, please call our office to schedule the follow-up appointment.   Any Other Special Instructions Will Be Listed Below (If Applicable).     If you need a refill on your cardiac medications before your next appointment, please call your pharmacy.  Thank you for choosing [facility]!   

## 2017-08-02 NOTE — Progress Notes (Signed)
HPI [name] returns today for ongoing evaluation of his PPM and chronic systolic heart failure and chronic atrial fib. He has had problems with his right leg which is being treated by his primary MD. He does not have heart failure symptoms. He does not have symptoms from his atrial fib. He is considering a move to be close to his son in [place].  Allergies  Allergen Reactions  . Statins Rash     Current Outpatient Medications  Medication Sig Dispense Refill  . carvedilol (COREG) 3.125 MG tablet Take 1 tablet (3.125 mg total) by mouth 2 (two) times daily. (Patient taking differently: Take 3.125 mg by mouth daily. ) 180 tablet 3  . levothyroxine (SYNTHROID, LEVOTHROID) 112 MCG tablet Take 112 mcg by mouth daily.      . Multiple Vitamin (MULTIVITAMIN WITH MINERALS) TABS tablet Take 0.5 tablets by mouth daily.    Marland Kitchen. torsemide (DEMADEX) 20 MG tablet Take 20 mg by mouth daily. Only takes it when his weight goes up a pound or 2    . warfarin (COUMADIN) 2 MG tablet TAKE ONE WHOLE TABLET BY MOUTH DAILY EXCEPT  TAKE  ONE  AND  ONE-HALF  TABLET  DAILY  ON  SUNDAYS,  TUESDAYS  AND  THURSDAYS. 90 tablet 3   No current facility-administered medications for this visit.      Past Medical History:  Diagnosis Date  . AICD (automatic cardioverter/defibrillator) present   . Arteriosclerotic cardiovascular disease (ASCVD) 2003   status post CABG surgery-2003 following AMI; ischemic cardiomyopathy with an EF of 25%; s/p AICD implantation-2007; h/o LBBB; possible apical thrombus   . CHF (congestive heart failure) (HCC)   . Chronic anticoagulation    continued for both PAF & possible apical mural thrombus.  . Coronary artery disease   . Hyperlipidemia   . Hypertension   . Hypothyroid   . Mitral regurgitation    Mild by echo in 08/2007 but moderate to severe in 09/2008  . Other primary cardiomyopathies   . Paroxysmal atrial fibrillation (HCC) 2003   Subsequently permanent    ROS:   All  systems reviewed and negative except as noted in the HPI.   Past Surgical History:  Procedure Laterality Date  . BI-VENTRICULAR PACEMAKER INSERTION N/A 05/01/2012   Procedure: BI-VENTRICULAR PACEMAKER INSERTION (CRT-P);  Surgeon: Marinus MawGregg W Breauna Mazzeo, MD;  [facility] LAB;  Service: Cardiovascular;  Laterality: N/A;  . CARDIAC DEFIBRILLATOR PLACEMENT  2007  . CORONARY ARTERY BYPASS GRAFT  2003  . INSERT / REPLACE / REMOVE PACEMAKER       No family history on file.   Social History   Socioeconomic History  . Marital status: Married    Spouse name: Not on file  . Number of children: Not on file  . Years of education: Not on file  . Highest education level: Not on file  Social Needs  . Financial resource strain: Not on file  . Food insecurity - worry: Not on file  . Food insecurity - inability: Not on file  . Transportation needs - medical: Not on file  . Transportation needs - non-medical: Not on file  Occupational History  . Not on file  Tobacco Use  . Smoking status: Never Smoker  . Smokeless tobacco: Never Used  Substance and Sexual Activity  . Alcohol use: No    Alcohol/week: 0.0 oz  . Drug use: No  . Sexual activity: Not Currently  Other Topics Concern  . Not  on file  Social History Narrative  . Not on file     BP 116/68 (BP Location: Left Arm)   Pulse 69   Ht 6\' 1"  (1.854 m)   Wt 172 lb (78 kg)   SpO2 95%   BMI 22.69 kg/m   Physical Exam:  Well appearing 83 yo man, NAD HEENT: Unremarkable Neck:  6 cm JVD, no thyromegally Lymphatics:  No adenopathy Back:  No CVA tenderness Lungs:  Clear HEART:  Regular rate rhythm, no murmurs, no rubs, no clicks Abd:  soft, positive bowel sounds, no organomegally, no rebound, no guarding Ext:  2 plus pulses, no edema, no cyanosis, no clubbing Skin:  No rashes no nodules Neuro:  CN II through XII intact, motor grossly intact  DEVICE  Normal device function.  See PaceArt for details.   Assess/Plan: 1. Chronic  systolic heart failure - he denies CHF symptoms. He has been a bit more sedentary because of his leg. Will follow. 2. ICM - he is still very active despite his 89 years. He denies anginal symptoms.  3. Biv PPM - his boston Biv PPM is working normally.  4. Atrial fib - his rate is controlled and he will continue hi systemic anti-coagulation.  Leonia ReevesGregg Malakhai Beitler,M.D.

## 2017-10-05 ENCOUNTER — Telehealth: Payer: Self-pay

## 2017-10-05 ENCOUNTER — Telehealth: Payer: Self-pay | Admitting: *Deleted

## 2017-10-05 NOTE — Telephone Encounter (Signed)
Spoke with patient who reports new ShOB and limits his physical activity. He does not have a home monitor and is requesting to come into the office for a PPM check. I offered an appointment for today but patient is unable to leave his wife without a Comptrollersitter. Patient was agreeable to Monday 2/4 at 0930 with the DC.

## 2017-10-05 NOTE — Telephone Encounter (Signed)
Patient called stating that he is SOB when walking. Reports that he is unable to walk to mailbox with out stopping for breaks ( 100 ft.). Pt wt today is 165 lbs. Pt wt on yesterday was 167 lbs. Pt states that he took his Torsemide on yesterday. Pt states that he feels his SOB is coming from his pacemaker.

## 2017-10-08 ENCOUNTER — Emergency Department (HOSPITAL_COMMUNITY)
Admission: EM | Admit: 2017-10-08 | Discharge: 2017-10-08 | Disposition: A | Discharge status: Home / Self Care | Payer: Medicare Other | Attending: Emergency Medicine | Admitting: Emergency Medicine

## 2017-10-08 ENCOUNTER — Other Ambulatory Visit: Payer: Self-pay

## 2017-10-08 ENCOUNTER — Encounter (HOSPITAL_COMMUNITY): Payer: Self-pay | Admitting: Emergency Medicine

## 2017-10-08 ENCOUNTER — Emergency Department (HOSPITAL_COMMUNITY): Payer: Medicare Other

## 2017-10-08 DIAGNOSIS — Z950 Presence of cardiac pacemaker: Secondary | ICD-10-CM | POA: Diagnosis not present

## 2017-10-08 DIAGNOSIS — J069 Acute upper respiratory infection, unspecified: Secondary | ICD-10-CM | POA: Diagnosis not present

## 2017-10-08 DIAGNOSIS — Z7901 Long term (current) use of anticoagulants: Secondary | ICD-10-CM | POA: Diagnosis not present

## 2017-10-08 DIAGNOSIS — I2510 Atherosclerotic heart disease of native coronary artery without angina pectoris: Secondary | ICD-10-CM | POA: Diagnosis not present

## 2017-10-08 DIAGNOSIS — E039 Hypothyroidism, unspecified: Secondary | ICD-10-CM | POA: Insufficient documentation

## 2017-10-08 DIAGNOSIS — R05 Cough: Secondary | ICD-10-CM | POA: Diagnosis present

## 2017-10-08 DIAGNOSIS — Z951 Presence of aortocoronary bypass graft: Secondary | ICD-10-CM | POA: Insufficient documentation

## 2017-10-08 DIAGNOSIS — Z79899 Other long term (current) drug therapy: Secondary | ICD-10-CM | POA: Insufficient documentation

## 2017-10-08 DIAGNOSIS — N183 Chronic kidney disease, stage 3 (moderate): Secondary | ICD-10-CM | POA: Diagnosis not present

## 2017-10-08 DIAGNOSIS — I1310 Hypertensive heart and chronic kidney disease without heart failure, with stage 1 through stage 4 chronic kidney disease, or unspecified chronic kidney disease: Secondary | ICD-10-CM | POA: Insufficient documentation

## 2017-10-08 LAB — URINALYSIS, ROUTINE W REFLEX MICROSCOPIC
Bacteria, UA: NONE SEEN
Bilirubin Urine: NEGATIVE
GLUCOSE, UA: NEGATIVE mg/dL
Ketones, ur: NEGATIVE mg/dL
Leukocytes, UA: NEGATIVE
Nitrite: NEGATIVE
PH: 5 (ref 5.0–8.0)
Protein, ur: 100 mg/dL — AB
Specific Gravity, Urine: 1.02 (ref 1.005–1.030)

## 2017-10-08 LAB — COMPREHENSIVE METABOLIC PANEL
ALK PHOS: 98 U/L (ref 38–126)
ALT: 18 U/L (ref 17–63)
AST: 36 U/L (ref 15–41)
Albumin: 3.3 g/dL — ABNORMAL LOW (ref 3.5–5.0)
Anion gap: 11 (ref 5–15)
BILIRUBIN TOTAL: 1.8 mg/dL — AB (ref 0.3–1.2)
BUN: 35 mg/dL — ABNORMAL HIGH (ref 6–20)
CALCIUM: 8.8 mg/dL — AB (ref 8.9–10.3)
CO2: 25 mmol/L (ref 22–32)
CREATININE: 1.66 mg/dL — AB (ref 0.61–1.24)
Chloride: 103 mmol/L (ref 101–111)
GFR calc non Af Amer: 34 mL/min — ABNORMAL LOW (ref >60)
GFR, EST AFRICAN AMERICAN: 40 mL/min — AB (ref >60)
Glucose, Bld: 91 mg/dL (ref 65–99)
Potassium: 4 mmol/L (ref 3.5–5.1)
SODIUM: 139 mmol/L (ref 135–145)
Total Protein: 6.4 g/dL — ABNORMAL LOW (ref 6.5–8.1)

## 2017-10-08 LAB — CBC
HCT: 38.8 % — ABNORMAL LOW (ref 39.0–52.0)
Hemoglobin: 12.1 g/dL — ABNORMAL LOW (ref 13.0–17.0)
MCH: 29.7 pg (ref 26.0–34.0)
MCHC: 31.2 g/dL (ref 30.0–36.0)
MCV: 95.3 fL (ref 78.0–100.0)
PLATELETS: 132 10*3/uL — AB (ref 150–400)
RBC: 4.07 MIL/uL — AB (ref 4.22–5.81)
RDW: 15.4 % (ref 11.5–15.5)
WBC: 5.8 10*3/uL (ref 4.0–10.5)

## 2017-10-08 LAB — LIPASE, BLOOD: Lipase: 27 U/L (ref 11–51)

## 2017-10-08 MED ORDER — SODIUM CHLORIDE 0.9 % IV BOLUS (SEPSIS): 1000.0000 mL | Freq: Once | INTRAVENOUS | Status: DC

## 2017-10-08 MED ORDER — SODIUM CHLORIDE 0.9 % IV BOLUS (SEPSIS)
500.0000 mL | Freq: Once | INTRAVENOUS | Status: AC
  Administered 2017-10-08: 500 mL via INTRAVENOUS

## 2017-10-08 MED ORDER — ALBUTEROL SULFATE (2.5 MG/3ML) 0.083% IN NEBU
2.5000 mg | INHALATION_SOLUTION | RESPIRATORY_TRACT | Status: DC | PRN
  Administered 2017-10-08: 2.5 mg via RESPIRATORY_TRACT
  Filled 2017-10-08: qty 3

## 2017-10-08 MED ORDER — ALBUTEROL SULFATE HFA 108 (90 BASE) MCG/ACT IN AERS: 1.0000 | INHALATION_SPRAY | Freq: Four times a day (QID) | RESPIRATORY_TRACT | 0 refills | Status: AC | PRN

## 2017-10-08 MED ORDER — BENZONATATE 100 MG PO CAPS: 100.0000 mg | ORAL_CAPSULE | Freq: Three times a day (TID) | ORAL | 0 refills | Status: AC

## 2017-10-08 NOTE — Discharge Instructions (Signed)
Increase your water intake. Use inhaler as needed for cough.  Tessalon to suppress cough. Return here with new or worsening symptoms or routine primary care follow-up with Dr. Margo AyeHall.

## 2017-10-08 NOTE — ED Triage Notes (Signed)
Pt reports abd pain, weakness, decreased appetite. Pt denies emesis, diarrhea, fever. nad noted.

## 2017-10-08 NOTE — ED Provider Notes (Signed)
[facility] EMERGENCY DEPARTMENT Provider Note   CSN: [id number] Arrival date & time: 10/08/17  0932     History   Chief Complaint Chief Complaint  Patient presents with  . Abdominal Pain    HPI [name] is a 84 y.o. male.  Chief complaint is weakness and cough  HPI [name] is 89.  He has a history of coronary artery bypass grafting and CHF with EF of 20.  He describes not feeling well for 3 days.  He did not have a good appetite.  He has been drinking water fairly well.  "Managing" some p.o.  No nausea poor appetite.  He states that he coughs when he walks to the mailbox.  Sometimes he is limited by shortness of breath.  He estimates that this is slightly worse for the last 2 days.  He has not had PND, orthopnea.  He is compliant with his Demadex.  He states he took a pill yesterday while watching the Super Bowl and "peed a couple of quarts".  He has chronic wound on his right leg from a scratch from a few months ago.  He is getting weekly wound care.  He reports that this is getting better.  He states that his lab tests at the wound care center last week were put "pretty good".  He states that his leg feels at its baseline and he requests that the dressing not be removed.  Denies chest pain.  He does not have abdominal pain.  His bowels are moving "just fine".  In general he has weakness and cough, poor appetite,  Past Medical History:  Diagnosis Date  . AICD (automatic cardioverter/defibrillator) present   . Arteriosclerotic cardiovascular disease (ASCVD) 2003   status post CABG surgery-2003 following AMI; ischemic cardiomyopathy with an EF of 25%; s/p AICD implantation-2007; h/o LBBB; possible apical thrombus   . CHF (congestive heart failure) (HCC)   . Chronic anticoagulation    continued for both PAF & possible apical mural thrombus.  . Coronary artery disease   . Hyperlipidemia   . Hypertension   . Hypothyroid   . Mitral regurgitation    Mild by echo in  08/2007 but moderate to severe in 09/2008  . Other primary cardiomyopathies   . Paroxysmal atrial fibrillation (HCC) 2003   Subsequently permanent    Patient Active Problem List   Diagnosis Date Noted  . Wheezing 11/08/2016  . Encounter for therapeutic drug monitoring 10/23/2013  . Chronic systolic heart failure (HCC) 05/20/2013  . Skin macule or macular rash 05/20/2013  . Atrial fibrillation (HCC) 12/05/2012  . Chronic kidney disease, stage III (moderate) (HCC) 10/25/2012  . Thrombocytopenia-resolved 05/31/2011  . Hypertension   . Arteriosclerotic cardiovascular disease (ASCVD)   . Biventricular cardiac pacemaker in situ   . Chronic anticoagulation 11/19/2010  . Hypothyroidism 08/31/2010  . Hyperlipidemia 08/31/2010    Past Surgical History:  Procedure Laterality Date  . BI-VENTRICULAR PACEMAKER INSERTION N/A 05/01/2012   Procedure: BI-VENTRICULAR PACEMAKER INSERTION (CRT-P);  Surgeon: Marinus Maw, MD;  Location: [facility] CATH LAB;  Service: Cardiovascular;  Laterality: N/A;  . CARDIAC DEFIBRILLATOR PLACEMENT  2007  . CORONARY ARTERY BYPASS GRAFT  2003  . INSERT / REPLACE / REMOVE PACEMAKER         Home Medications    Prior to Admission medications   Medication Sig Start Date End Date Taking? Authorizing Provider  albuterol (PROVENTIL HFA;VENTOLIN HFA) 108 (90 Base) MCG/ACT inhaler Inhale 1-2 puffs into the lungs every 6 (  six) hours as needed for wheezing. 10/08/17   Rolland PorterJames, Lakesha Levinson, MD  benzonatate (TESSALON) 100 MG capsule Take 1 capsule (100 mg total) by mouth every 8 (eight) hours. 10/08/17   Rolland PorterJames, Karey Stucki, MD  carvedilol (COREG) 3.125 MG tablet Take 1 tablet (3.125 mg total) by mouth 2 (two) times daily. Patient taking differently: Take 3.125 mg by mouth daily.  10/14/15   Antoine PocheBranch, Jonathan F, MD  levothyroxine (SYNTHROID, LEVOTHROID) 112 MCG tablet Take 112 mcg by mouth daily.      [provider]  Multiple Vitamin (MULTIVITAMIN WITH MINERALS) TABS tablet Take 0.5 tablets by  mouth daily.    [provider]  torsemide (DEMADEX) 20 MG tablet Take 20 mg by mouth daily. Only takes it when his weight goes up a pound or 2    [provider]  warfarin (COUMADIN) 2 MG tablet TAKE ONE WHOLE TABLET BY MOUTH DAILY EXCEPT  TAKE  ONE  AND  ONE-HALF  TABLET  DAILY  ON  SUNDAYS,  TUESDAYS  AND  THURSDAYS. 12/19/16   Marinus Mawaylor, Gregg W, MD    Family History History reviewed. No pertinent family history.  Social History Social History   Tobacco Use  . Smoking status: Never Smoker  . Smokeless tobacco: Never Used  Substance Use Topics  . Alcohol use: No    Alcohol/week: 0.0 oz  . Drug use: No     Allergies   Statins   Review of Systems Review of Systems  Constitutional: Positive for appetite change and fatigue. Negative for chills, diaphoresis and fever.  HENT: Negative for mouth sores, sore throat and trouble swallowing.   Eyes: Negative for visual disturbance.  Respiratory: Positive for cough. Negative for chest tightness, shortness of breath and wheezing.   Cardiovascular: Negative for chest pain.  Gastrointestinal: Negative for abdominal distention, abdominal pain, diarrhea, nausea and vomiting.  Endocrine: Negative for polydipsia, polyphagia and polyuria.  Genitourinary: Negative for dysuria, frequency and hematuria.  Musculoskeletal: Negative for gait problem.  Skin: Negative for color change, pallor and rash.  Neurological: Negative for dizziness, syncope, light-headedness and headaches.  Hematological: Does not bruise/bleed easily.  Psychiatric/Behavioral: Negative for behavioral problems and confusion.     Physical Exam Updated Vital Signs BP 115/78   Pulse 88   Temp 98.7 F (37.1 C) (Oral)   Resp 16   Ht 5\' 11"  (1.803 m)   Wt 72.6 kg (160 lb)   SpO2 96%   BMI 22.32 kg/m   Physical Exam  Constitutional: He is oriented to person, place, and time. He appears well-developed and well-nourished. No distress.  HENT:  Head:  Normocephalic.  Mucous membranes are not dried.  No conjunctival injection.  Conjunctive are not pale.  Eyes: Conjunctivae are normal. Pupils are equal, round, and reactive to light. No scleral icterus.  Neck: Normal range of motion. Neck supple. No thyromegaly present.  Cardiovascular: Normal rate and regular rhythm. Exam reveals no gallop and no friction rub.  No murmur heard. Paced rhythm.  Pulmonary/Chest: Effort normal and breath sounds normal. No respiratory distress. He has no wheezes. He has no rales.  Mild end expiratory wheezing.  Bronchospastic cough.  Overall good air exchange and 95% saturations.  Abdominal: Soft. Bowel sounds are normal. He exhibits no distension. There is no tenderness. There is no rebound.  Musculoskeletal: Normal range of motion.  Neurological: He is alert and oriented to person, place, and time.  Skin: Skin is warm and dry. No rash noted.  He has a dressing  on his right lower leg from the mid calf to the mid foot.  His toes are pink and well perfused.  There is no proximal erythema tenderness swelling or discomfort proximal to the dressing and as per HPI the patient requested the dressing not be removed.  Psychiatric: He has a normal mood and affect. His behavior is normal.     ED Treatments / Results  Labs (all labs ordered are listed, but only abnormal results are displayed) Labs Reviewed  COMPREHENSIVE METABOLIC PANEL - Abnormal; Notable for the following components:      Result Value   BUN 35 (*)    Creatinine, Ser 1.66 (*)    Calcium 8.8 (*)    Total Protein 6.4 (*)    Albumin 3.3 (*)    Total Bilirubin 1.8 (*)    GFR calc non Af Amer 34 (*)    GFR calc Af Amer 40 (*)    All other components within normal limits  CBC - Abnormal; Notable for the following components:   RBC 4.07 (*)    Hemoglobin 12.1 (*)    HCT 38.8 (*)    Platelets 132 (*)    All other components within normal limits  LIPASE, BLOOD  URINALYSIS, ROUTINE W REFLEX  MICROSCOPIC    EKG  EKG Interpretation  Date/Time:  Monday October 08 2017 09:48:12 EST Ventricular Rate:  72 PR Interval:    QRS Duration: 154 QT Interval:  462 QTC Calculation: 505 R Axis:   -93 Text Interpretation:  Ventricular-paced rhythm with occasional Premature ventricular complexes Biventricular pacemaker detected Abnormal ECG Similar to prior.  Confirmed by Alona Bene [phone]) on 10/08/2017 9:52:34 AM       Radiology Dg Chest 2 View  Result Date: 10/08/2017 CLINICAL DATA:  Cough.  Pain. EXAM: CHEST  2 VIEW COMPARISON:  November 30, 2016 FINDINGS: Coarsened lung markings are identified with no focal infiltrate. No pneumothorax. The AICD device is stable. No pulmonary nodules or masses. Stable cardiomegaly. Small effusions. IMPRESSION: Coarsened lung markings with small effusions could represent mild developing edema versus bronchitic change. Electronically Signed   By: Gerome Sam III M.D   On: 10/08/2017 18:34    Procedures Procedures (including critical care time)  Medications Ordered in ED Medications  albuterol (PROVENTIL) (2.5 MG/3ML) 0.083% nebulizer solution 2.5 mg (2.5 mg Nebulization Given 10/08/17 1919)  sodium chloride 0.9 % bolus 500 mL (500 mLs Intravenous New Bag/Given 10/08/17 1848)     Initial Impression / Assessment and Plan / ED Course  I have reviewed the triage vital signs and the nursing notes.  Pertinent labs & imaging results that were available during my care of the patient were reviewed by me and considered in my medical decision making (see chart for details).   He is not febrile.  He does not have leukocytosis he does not have complaints regarding his leg I am not suspicious that this is a source for his current illness.  Plan will be half liter fluid with consideration of his CHF.  Nebulized albuterol treatment.  Chest x-ray to ensure no infiltrate or pneumonias since he is coughing and symptomatic.  He may be eligible for discharge with  symptomatic treatment for probable viral syndrome and upper respiratory infection.  EKG Interpretation  Date/Time:  Monday October 08 2017 09:48:12 EST Ventricular Rate:  72 PR Interval:    QRS Duration: 154 QT Interval:  462 QTC Calculation: 505 R Axis:   -93 Text Interpretation:  Ventricular-paced rhythm with occasional  Premature ventricular complexes Biventricular pacemaker detected Abnormal ECG Similar to prior.  Confirmed by Alona Bene [phone]) on 10/08/2017 9:52:34 AM   Final Clinical Impressions(s) / ED Diagnoses   Final diagnoses:  Upper respiratory tract infection, unspecified type    Given nebulized albuterol.  His symptoms do improve.  He can 1/2 L of fluid.  Chest x-ray shows probable bronchitic pattern.  Has trace bilateral effusions.  He feels considerably better after only 500 of IV fluid and single nebulized albuterol.  Plan is home.  Increase water and food intake.  Albuterol MDI.  Tessalon.  ED Discharge Orders        Ordered    benzonatate (TESSALON) 100 MG capsule  Every 8 hours     10/08/17 1922    albuterol (PROVENTIL HFA;VENTOLIN HFA) 108 (90 Base) MCG/ACT inhaler  Every 6 hours PRN     10/08/17 Dory Horn, MD 10/08/17 [phone]

## 2017-10-08 NOTE — ED Notes (Signed)
Pt notified need for urine sample. Will call staff when able to provide sample.

## 2017-10-12 ENCOUNTER — Other Ambulatory Visit: Payer: Self-pay | Admitting: Internal Medicine

## 2017-10-12 NOTE — Telephone Encounter (Signed)
Refill for warfarin received; pt has not been to have INR check since 08/01/17, therefore, pt is overdue. Called pt regarding scheduling an appt & had to leave a msg to callback as he is overdue for follow-up.

## 2017-10-16 NOTE — Telephone Encounter (Signed)
Pt has not returned phone call to schedule an appt. Left another msg for the pt to call back.

## 2017-10-24 ENCOUNTER — Emergency Department (HOSPITAL_COMMUNITY): Payer: Medicare Other

## 2017-10-24 ENCOUNTER — Other Ambulatory Visit: Payer: Self-pay

## 2017-10-24 ENCOUNTER — Encounter (HOSPITAL_COMMUNITY): Payer: Self-pay | Admitting: Emergency Medicine

## 2017-10-24 ENCOUNTER — Emergency Department (HOSPITAL_COMMUNITY)
Admission: EM | Admit: 2017-10-24 | Discharge: 2017-10-24 | Disposition: A | Discharge status: Home / Self Care | Payer: Medicare Other | Attending: Emergency Medicine | Admitting: Emergency Medicine

## 2017-10-24 DIAGNOSIS — I129 Hypertensive chronic kidney disease with stage 1 through stage 4 chronic kidney disease, or unspecified chronic kidney disease: Secondary | ICD-10-CM | POA: Diagnosis not present

## 2017-10-24 DIAGNOSIS — I5022 Chronic systolic (congestive) heart failure: Secondary | ICD-10-CM | POA: Insufficient documentation

## 2017-10-24 DIAGNOSIS — Z951 Presence of aortocoronary bypass graft: Secondary | ICD-10-CM | POA: Insufficient documentation

## 2017-10-24 DIAGNOSIS — Z5189 Encounter for other specified aftercare: Secondary | ICD-10-CM | POA: Insufficient documentation

## 2017-10-24 DIAGNOSIS — I2510 Atherosclerotic heart disease of native coronary artery without angina pectoris: Secondary | ICD-10-CM | POA: Diagnosis not present

## 2017-10-24 DIAGNOSIS — E039 Hypothyroidism, unspecified: Secondary | ICD-10-CM | POA: Insufficient documentation

## 2017-10-24 DIAGNOSIS — Z79899 Other long term (current) drug therapy: Secondary | ICD-10-CM | POA: Diagnosis not present

## 2017-10-24 DIAGNOSIS — Z950 Presence of cardiac pacemaker: Secondary | ICD-10-CM | POA: Diagnosis not present

## 2017-10-24 DIAGNOSIS — I480 Paroxysmal atrial fibrillation: Secondary | ICD-10-CM

## 2017-10-24 DIAGNOSIS — N183 Chronic kidney disease, stage 3 (moderate): Secondary | ICD-10-CM | POA: Insufficient documentation

## 2017-10-24 DIAGNOSIS — L03115 Cellulitis of right lower limb: Secondary | ICD-10-CM | POA: Diagnosis not present

## 2017-10-24 DIAGNOSIS — L98499 Non-pressure chronic ulcer of skin of other sites with unspecified severity: Secondary | ICD-10-CM | POA: Insufficient documentation

## 2017-10-24 LAB — CBC WITH DIFFERENTIAL/PLATELET
Basophils Absolute: 0 10*3/uL (ref 0.0–0.1)
Basophils Relative: 0 %
EOS ABS: 0.2 10*3/uL (ref 0.0–0.7)
Eosinophils Relative: 3 %
HEMATOCRIT: 42.5 % (ref 39.0–52.0)
HEMOGLOBIN: 13.4 g/dL (ref 13.0–17.0)
LYMPHS ABS: 1.2 10*3/uL (ref 0.7–4.0)
Lymphocytes Relative: 13 %
MCH: 29.6 pg (ref 26.0–34.0)
MCHC: 31.5 g/dL (ref 30.0–36.0)
MCV: 93.8 fL (ref 78.0–100.0)
MONO ABS: 0.8 10*3/uL (ref 0.1–1.0)
MONOS PCT: 9 %
NEUTROS PCT: 75 %
Neutro Abs: 6.8 10*3/uL (ref 1.7–7.7)
Platelets: 163 10*3/uL (ref 150–400)
RBC: 4.53 MIL/uL (ref 4.22–5.81)
RDW: 16.9 % — AB (ref 11.5–15.5)
WBC: 9 10*3/uL (ref 4.0–10.5)

## 2017-10-24 LAB — PROTIME-INR
INR: 1.43
Prothrombin Time: 17.3 seconds — ABNORMAL HIGH (ref 11.4–15.2)

## 2017-10-24 LAB — COMPREHENSIVE METABOLIC PANEL
ALK PHOS: 108 U/L (ref 38–126)
ALT: 20 U/L (ref 17–63)
ANION GAP: 11 (ref 5–15)
AST: 36 U/L (ref 15–41)
Albumin: 3.3 g/dL — ABNORMAL LOW (ref 3.5–5.0)
BILIRUBIN TOTAL: 1.8 mg/dL — AB (ref 0.3–1.2)
BUN: 33 mg/dL — ABNORMAL HIGH (ref 6–20)
CO2: 22 mmol/L (ref 22–32)
Calcium: 8.9 mg/dL (ref 8.9–10.3)
Chloride: 105 mmol/L (ref 101–111)
Creatinine, Ser: 1.71 mg/dL — ABNORMAL HIGH (ref 0.61–1.24)
GFR calc non Af Amer: 33 mL/min — ABNORMAL LOW (ref >60)
GFR, EST AFRICAN AMERICAN: 38 mL/min — AB (ref >60)
Glucose, Bld: 92 mg/dL (ref 65–99)
POTASSIUM: 3.7 mmol/L (ref 3.5–5.1)
SODIUM: 138 mmol/L (ref 135–145)
TOTAL PROTEIN: 7.2 g/dL (ref 6.5–8.1)

## 2017-10-24 MED ORDER — DOXYCYCLINE HYCLATE 100 MG PO CAPS: 100.0000 mg | ORAL_CAPSULE | Freq: Two times a day (BID) | ORAL | 0 refills | Status: AC

## 2017-10-24 MED ORDER — KETOROLAC TROMETHAMINE 30 MG/ML IJ SOLN
15.0000 mg | Freq: Once | INTRAMUSCULAR | Status: AC
  Administered 2017-10-24: 15 mg via INTRAVENOUS
  Filled 2017-10-24: qty 1

## 2017-10-24 MED ORDER — HYDROCODONE-ACETAMINOPHEN 5-325 MG PO TABS
1.0000 | ORAL_TABLET | Freq: Once | ORAL | Status: AC
  Administered 2017-10-24: 1 via ORAL
  Filled 2017-10-24: qty 1

## 2017-10-24 MED ORDER — CEPHALEXIN 500 MG PO CAPS: 500.0000 mg | ORAL_CAPSULE | Freq: Four times a day (QID) | ORAL | 0 refills | Status: AC

## 2017-10-24 MED ORDER — TRAMADOL HCL 50 MG PO TABS: 50.0000 mg | ORAL_TABLET | Freq: Four times a day (QID) | ORAL | 0 refills | Status: AC | PRN

## 2017-10-24 MED ORDER — VANCOMYCIN HCL IN DEXTROSE 1-5 GM/200ML-% IV SOLN
1000.0000 mg | Freq: Once | INTRAVENOUS | Status: AC
  Administered 2017-10-24: 1000 mg via INTRAVENOUS
  Filled 2017-10-24: qty 200

## 2017-10-24 NOTE — Care Management (Signed)
[facility]. Discussed orders needed with EDP.  Olegario MessierKathy of [facility] notified and will obtain orders from Epic. Patient will DC from ER.  No other CM needs.

## 2017-10-24 NOTE — Consult Note (Signed)
Medical Consultation   [name]  OZH:[id number]  DOB: 11-11-1922  DOA: 10/24/2017  PCP: Benita Stabile, MD   Outpatient Specialists: Dr. Tanda Rockers (wound [facility] in [place])   Requesting physician: Dr. Estell Harpin   Reason for consultation: Right lower extremity superimposed cellulitis    History of Present Illness: [name] is an 84 y.o. male with past medical history significant for paroxysmal atrial fibrillation, hypertension, chronic systolic heart failure, hypothyroidism, chronic renal failure stage III and peripheral vascular disease; who presented to the ED with concerns of superimposed cellulitis on his right lower extremity chronic wound.  Patient afebrile, denies chest pain, nausea, vomiting, shortness of breath, fever, chills or any other acute complaints. He has been without any use of antibiotics over the last 41-month and presented from his wound care office with concerns of lack of healing on his chronic varicose vein.  Case discussed with wound care doctor, who is concerned that the patient is noncompliant with wound care recommendations or wound dressings and is a main reason for the lack of healing in his wound.  In presentation and assessment he also have some mild superimposed cellulitis and internal medicine was called by ED doctor to further assess and assist in recommendations regarding potential need for hospitalization.  Review of Systems:  As per HPI otherwise 10 point review of systems negative.   Past Medical History: Past Medical History:  Diagnosis Date  . AICD (automatic cardioverter/defibrillator) present   . Arteriosclerotic cardiovascular disease (ASCVD) 2003   status post CABG surgery-2003 following AMI; ischemic cardiomyopathy with an EF of 25%; s/p AICD implantation-2007; h/o LBBB; possible apical thrombus   . CHF (congestive heart failure) (HCC)   . Chronic anticoagulation    continued for both PAF & possible apical mural  thrombus.  . Coronary artery disease   . Hyperlipidemia   . Hypertension   . Hypothyroid   . Mitral regurgitation    Mild by echo in 08/2007 but moderate to severe in 09/2008  . Other primary cardiomyopathies   . Paroxysmal atrial fibrillation (HCC) 2003   Subsequently permanent   Past Surgical History: Past Surgical History:  Procedure Laterality Date  . BI-VENTRICULAR PACEMAKER INSERTION N/A 05/01/2012   Procedure: BI-VENTRICULAR PACEMAKER INSERTION (CRT-P);  Surgeon: Marinus Maw, MD;  Location: [facility] CATH LAB;  Service: Cardiovascular;  Laterality: N/A;  . CARDIAC DEFIBRILLATOR PLACEMENT  2007  . CORONARY ARTERY BYPASS GRAFT  2003  . INSERT / REPLACE / REMOVE PACEMAKER     Allergies:   Allergies  Allergen Reactions  . Statins Rash    Social History:  reports that  has never smoked. he has never used smokeless tobacco. He reports that he does not drink alcohol or use drugs.   Family History: Per patient significant just for hypertension otherwise noncontributory.  Physical Exam: Vitals:   10/24/17 1400 10/24/17 1430 10/24/17 1500 10/24/17 1530  BP: (!) 132/97 (!) 137/106 (!) 135/114 (!) 137/101  Pulse: 64 64 73 70  Resp:      Temp:      TempSrc:      SpO2: (!) 89% 96% 96% 100%  Weight:      Height:        Constitutional: Alert and awake, oriented x3, not in any acute distress.  Afebrile, denies chest pain or shortness of breath. Eyes: PERLA, EOMI, irises appear normal, anicteric sclera, no icterus, no nystagmus. ENMT: external  ears and nose appear normal, Lips appears normal, oropharynx mucosa, tongue, posterior pharynx appear normal  Neck: neck appears normal, no masses, normal ROM, no thyromegaly. CVS: S1-S2 clear, no rubs or gallops, positive systolic ejection murmur appreciated on exam, no JVD.   Respiratory:  clear to auscultation bilaterally, no wheezing, rales or rhonchi. Respiratory effort normal. No accessory muscle use.  Abdomen: soft nontender,  nondistended, normal bowel sounds, no hepatosplenomegaly, no hernias  Musculoskeletal: : no cyanosis or clubbing, trace edema noted bilaterally at this moment. Neuro: Cranial nerves II-XII intact, strength, sensation, reflexes Psych: judgement and insight appear normal, stable mood and affect, mental status Skin: Patient with positive right lower extremity wound, lateral aspect, with mild erythema affecting the edges of the wound and also with serosanguineous drainage; per patient reports some intermittent pain (unchanged) and about the same size over the las 3 weeks.   Data reviewed:  I have personally reviewed following labs and imaging studies Labs:  CBC: Recent Labs  Lab 10/24/17 1305  WBC 9.0  NEUTROABS 6.8  HGB 13.4  HCT 42.5  MCV 93.8  PLT 163    Basic Metabolic Panel: Recent Labs  Lab 10/24/17 1305  NA 138  K 3.7  CL 105  CO2 22  GLUCOSE 92  BUN 33*  CREATININE 1.71*  CALCIUM 8.9   GFR Estimated Creatinine Clearance: 28.6 mL/min (A) (by C-G formula based on SCr of 1.71 mg/dL (H)).   Liver Function Tests: Recent Labs  Lab 10/24/17 1305  AST 36  ALT 20  ALKPHOS 108  BILITOT 1.8*  PROT 7.2  ALBUMIN 3.3*   Coagulation profile Recent Labs  Lab 10/24/17 1305  INR 1.43    Urinalysis    Component Value Date/Time   COLORURINE AMBER (A) 10/08/2017 2000   APPEARANCEUR HAZY (A) 10/08/2017 2000   LABSPEC 1.020 10/08/2017 2000   PHURINE 5.0 10/08/2017 2000   GLUCOSEU NEGATIVE 10/08/2017 2000   HGBUR LARGE (A) 10/08/2017 2000   BILIRUBINUR NEGATIVE 10/08/2017 2000   KETONESUR NEGATIVE 10/08/2017 2000   PROTEINUR 100 (A) 10/08/2017 2000   UROBILINOGEN 2.0 (H) 05/31/2011 1916   NITRITE NEGATIVE 10/08/2017 2000   LEUKOCYTESUR NEGATIVE 10/08/2017 2000     Microbiology Recent Results (from the past 240 hour(s))  Blood culture (routine x 2)     Status: None (Preliminary result)   Collection Time: 10/24/17  1:05 PM  Result Value Ref Range Status    Specimen Description LEFT ANTECUBITAL  Final   Special Requests   Final    BOTTLES DRAWN AEROBIC AND ANAEROBIC Blood Culture adequate volume Performed at [facility], [address] [place], [place] [address]    Culture PENDING  Incomplete   Report Status PENDING  Incomplete  Blood culture (routine x 2)     Status: None (Preliminary result)   Collection Time: 10/24/17  1:10 PM  Result Value Ref Range Status   Specimen Description RIGHT ANTECUBITAL  Final   Special Requests   Final    BOTTLES DRAWN AEROBIC AND ANAEROBIC Blood Culture adequate volume Performed at [facility], [address] [place], [place] [address]    Culture PENDING  Incomplete   Report Status PENDING  Incomplete   Inpatient Medications:   Scheduled Meds: Continuous Infusions: . vancomycin 1,000 mg (10/24/17 1515)     Radiological Exams on Admission: Dg Ankle 2 Views Right  Result Date: 10/24/2017 CLINICAL DATA:  Necrotic wound. EXAM: RIGHT ANKLE - 2 VIEW COMPARISON:  None. FINDINGS: Soft tissue defect identified  laterally in the distal leg. No underlying bony abnormality. No radiopaque soft tissue foreign body. IMPRESSION: Lateral soft tissue lesion in the distal leg without underlying bony abnormality. Electronically Signed   By: Kennith Center M.D.   On: 10/24/2017 12:58    Impression/Recommendations 1-chronic right leg wound with superimposed cellulitis -Case discussed with patient's wound care doctor (Dr. Tanda Rockers); patient essentially noncompliant with recommendations, wound care dressings and definitely no maintaining as much as possible leg elevation. -Lack of healing and is slightly serosanguineous drainage has been appreciated -Chest x-ray has rule out osteomyelitis -Patient without fever, normal WBCs, no significant changes regarding pain but was on redness around his wound edges. -After discussing with ID (Dr. Ninetta Lights) and given over 2 months without any antibiotic treatment will recommend 10 days of  doxycycline and Keflex -Patient will require close follow-up his Coumadin clinic as the antibiotics can mess up his INR level -ED doctor will involve case manager in order to provide home health services to better address his wound care -further outpatient follow up with either current wound care doctor or in a new wound care center. -Patient educated about importance for medication and recommendations compliance to try to prevent future amputation.  2-history of paroxysmal atrial fibrillation -Rate controlled -CHADsVASC score 4 -Continue carvedilol for rate control and the use of warfarin for secondary prevention   3-hypothyroidism -continue synthroid   4-hx of chronic systolic CHF -las echo checked in 2014 with EF of 15-20% -s/p biventricular pacing  -compensated  -continue home dose of demadex and B-blocker   5-chronic stage III renal failure -Cr At baseline -advise to maintain adequate hydration  -repeat BMET at follow up visit as an outpatient, to reassess renal function trend   6-HTN -stable -continue heart healthy diet and continue current antihypertensive regimen   # rest of his medical problems appears stable. Patient will continue current home medication regimen and follow up with PCP as and outpatient for future medications adjustments.   Thank you for this consultation.  Patient is medically stable to be treated with oral antibiotics and arrangements for home health services in order to improve/facilitate healing of his chronic wound.  He can definitely continue outpatient follow-up with his wound care doctor in [place] or establish care in a different wound care facility.   Time Spent: 45 minutes  Vassie Loll M.D. [facility] 10/24/2017, 4:04 PM

## 2017-10-24 NOTE — ED Provider Notes (Signed)
[facility] EMERGENCY DEPARTMENT Provider Note   CSN: [id number] Arrival date & time: 10/24/17  1228     History   Chief Complaint Chief Complaint  Patient presents with  . Wound Check    R leg    HPI [name] is a 84 y.o. male.  Patient sent over here from the wound care because his ulcer is getting worse.  He sees Dr. Tanda Rockers.  Patient has been not been doing good job taking results are   The history is provided by the patient. No language interpreter was used.  Wound Check  This is a recurrent problem. The current episode started 3 to 5 hours ago. The problem occurs daily. The problem has not changed since onset.Pertinent negatives include no chest pain, no abdominal pain and no headaches. Nothing aggravates the symptoms. Nothing relieves the symptoms. He has tried nothing for the symptoms. The treatment provided no relief.    Past Medical History:  Diagnosis Date  . AICD (automatic cardioverter/defibrillator) present   . Arteriosclerotic cardiovascular disease (ASCVD) 2003   status post CABG surgery-2003 following AMI; ischemic cardiomyopathy with an EF of 25%; s/p AICD implantation-2007; h/o LBBB; possible apical thrombus   . CHF (congestive heart failure) (HCC)   . Chronic anticoagulation    continued for both PAF & possible apical mural thrombus.  . Coronary artery disease   . Hyperlipidemia   . Hypertension   . Hypothyroid   . Mitral regurgitation    Mild by echo in 08/2007 but moderate to severe in 09/2008  . Other primary cardiomyopathies   . Paroxysmal atrial fibrillation (HCC) 2003   Subsequently permanent    Patient Active Problem List   Diagnosis Date Noted  . Wheezing 11/08/2016  . Encounter for therapeutic drug monitoring 10/23/2013  . Chronic systolic heart failure (HCC) 05/20/2013  . Skin macule or macular rash 05/20/2013  . Atrial fibrillation (HCC) 12/05/2012  . Chronic kidney disease, stage III (moderate) (HCC) 10/25/2012  .  Thrombocytopenia-resolved 05/31/2011  . Hypertension   . Arteriosclerotic cardiovascular disease (ASCVD)   . Biventricular cardiac pacemaker in situ   . Chronic anticoagulation 11/19/2010  . Hypothyroidism 08/31/2010  . Hyperlipidemia 08/31/2010    Past Surgical History:  Procedure Laterality Date  . BI-VENTRICULAR PACEMAKER INSERTION N/A 05/01/2012   Procedure: BI-VENTRICULAR PACEMAKER INSERTION (CRT-P);  Surgeon: Marinus Maw, MD;  Location: [facility] CATH LAB;  Service: Cardiovascular;  Laterality: N/A;  . CARDIAC DEFIBRILLATOR PLACEMENT  2007  . CORONARY ARTERY BYPASS GRAFT  2003  . INSERT / REPLACE / REMOVE PACEMAKER         Home Medications    Prior to Admission medications   Medication Sig Start Date End Date Taking? Authorizing Provider  carvedilol (COREG) 3.125 MG tablet Take 1 tablet (3.125 mg total) by mouth 2 (two) times daily. Patient taking differently: Take 3.125 mg by mouth daily.  10/14/15  Yes BranchDorothe Pea, MD  levothyroxine (SYNTHROID, LEVOTHROID) 112 MCG tablet Take 112 mcg by mouth daily.     Yes [provider]  Multiple Vitamin (MULTIVITAMIN WITH MINERALS) TABS tablet Take 0.5 tablets by mouth daily.   Yes [provider]  torsemide (DEMADEX) 20 MG tablet Take 20 mg by mouth daily. Only takes it when his weight goes up a pound or 2   Yes [provider]  warfarin (COUMADIN) 2 MG tablet Take 1 1/2 tablets daily except 1 tablet on Wednesdays and Saturdays 10/16/17  Yes Marinus Maw, MD  albuterol (PROVENTIL HFA;VENTOLIN HFA) 108 (90 Base) MCG/ACT inhaler Inhale 1-2 puffs into the lungs every 6 (six) hours as needed for wheezing. Patient not taking: Reported on 10/24/2017 10/08/17   Rolland PorterJames, Mark, MD  benzonatate (TESSALON) 100 MG capsule Take 1 capsule (100 mg total) by mouth every 8 (eight) hours. Patient not taking: Reported on 10/24/2017 10/08/17   Rolland PorterJames, Mark, MD  cephALEXin (KEFLEX) 500 MG capsule Take 1 capsule (500 mg total) by mouth 4  (four) times daily. 10/24/17   Bethann BerkshireZammit, Hanish Laraia, MD  doxycycline (VIBRAMYCIN) 100 MG capsule Take 1 capsule (100 mg total) by mouth 2 (two) times daily. One po bid x 7 days 10/24/17   Bethann BerkshireZammit, Namish Krise, MD    Family History History reviewed. No pertinent family history.  Social History Social History   Tobacco Use  . Smoking status: Never Smoker  . Smokeless tobacco: Never Used  Substance Use Topics  . Alcohol use: No    Alcohol/week: 0.0 oz  . Drug use: No     Allergies   Statins   Review of Systems Review of Systems  Constitutional: Negative for appetite change and fatigue.  HENT: Negative for congestion, ear discharge and sinus pressure.   Eyes: Negative for discharge.  Respiratory: Negative for cough.   Cardiovascular: Negative for chest pain.  Gastrointestinal: Negative for abdominal pain and diarrhea.  Genitourinary: Negative for frequency and hematuria.  Musculoskeletal: Negative for back pain.       Large ulcer to right lower leg  Skin: Negative for rash.  Neurological: Negative for seizures and headaches.  Psychiatric/Behavioral: Negative for hallucinations.     Physical Exam Updated Vital Signs BP (!) 137/101   Pulse 70   Temp (!) 96.3 F (35.7 C) (Temporal)   Resp 16   Ht 5\' 11"  (1.803 m)   Wt 74.8 kg (165 lb)   SpO2 100%   BMI 23.01 kg/m   Physical Exam  Constitutional: He is oriented to person, place, and time. He appears well-developed.  HENT:  Head: Normocephalic.  Eyes: Conjunctivae and EOM are normal. No scleral icterus.  Neck: Neck supple. No thyromegaly present.  Cardiovascular: Normal rate and regular rhythm. Exam reveals no gallop and no friction rub.  No murmur heard. Pulmonary/Chest: No stridor. He has no wheezes. He has no rales. He exhibits no tenderness.  Abdominal: He exhibits no distension. There is no tenderness. There is no rebound.  Musculoskeletal: Normal range of motion. He exhibits no edema.  Large ulcer to right lower leg    Lymphadenopathy:    He has no cervical adenopathy.  Neurological: He is oriented to person, place, and time. He exhibits normal muscle tone. Coordination normal.  Skin: No rash noted. No erythema.  Psychiatric: He has a normal mood and affect. His behavior is normal.     ED Treatments / Results  Labs (all labs ordered are listed, but only abnormal results are displayed) Labs Reviewed  CBC WITH DIFFERENTIAL/PLATELET - Abnormal; Notable for the following components:      Result Value   RDW 16.9 (*)    All other components within normal limits  COMPREHENSIVE METABOLIC PANEL - Abnormal; Notable for the following components:   BUN 33 (*)    Creatinine, Ser 1.71 (*)    Albumin 3.3 (*)    Total Bilirubin 1.8 (*)    GFR calc non Af Amer 33 (*)    GFR calc Af Amer 38 (*)    All other components within normal limits  PROTIME-INR -  Abnormal; Notable for the following components:   Prothrombin Time 17.3 (*)    All other components within normal limits  CULTURE, BLOOD (ROUTINE X 2)  CULTURE, BLOOD (ROUTINE X 2)  AEROBIC CULTURE (SUPERFICIAL SPECIMEN)    EKG  EKG Interpretation None       Radiology Dg Ankle 2 Views Right  Result Date: 10/24/2017 CLINICAL DATA:  Necrotic wound. EXAM: RIGHT ANKLE - 2 VIEW COMPARISON:  None. FINDINGS: Soft tissue defect identified laterally in the distal leg. No underlying bony abnormality. No radiopaque soft tissue foreign body. IMPRESSION: Lateral soft tissue lesion in the distal leg without underlying bony abnormality. Electronically Signed   By: Kennith Center M.D.   On: 10/24/2017 12:58    Procedures Procedures (including critical care time)  Medications Ordered in ED Medications  vancomycin (VANCOCIN) IVPB 1000 mg/200 mL premix (1,000 mg Intravenous New Bag/Given 10/24/17 1515)  ketorolac (TORADOL) 30 MG/ML injection 15 mg (15 mg Intravenous Given 10/24/17 1441)     Initial Impression / Assessment and Plan / ED Course  I have reviewed the  triage vital signs and the nursing notes.  Pertinent labs & imaging results that were available during my care of the patient were reviewed by me and considered in my medical decision making (see chart for details).    Patient has an ulcer to his right lower leg that has been there for quite some time.  I spoke with Dr. Tanda Rockers his wound care doctor and I spoke with the hospitalist.  We decided that he did not meet in hospital treatment.  He will be put on doxycycline X an Unna boot and home health has been set up for him to get dressing changes couple times a week he will also see his primary care doctor in the wound care doctor   Final Clinical Impressions(s) / ED Diagnoses   Final diagnoses:  Visit for wound check    ED Discharge Orders        Ordered    doxycycline (VIBRAMYCIN) 100 MG capsule  2 times daily     10/24/17 1607    cephALEXin (KEFLEX) 500 MG capsule  4 times daily     10/24/17 1607       Bethann Berkshire, MD 10/24/17 1617

## 2017-10-24 NOTE — ED Triage Notes (Signed)
[facility] [facility]anda Rockers[facility]ichols- was advised wound is necrotic and pt needs admission  [facility] [place] [facility] [place] [facility] by Dr Margo AyeHall  Pt [place] wound [place]

## 2017-10-24 NOTE — ED Notes (Signed)
Pt requesting more pain medication. PO order for Norco given verbally.

## 2017-10-24 NOTE — ED Notes (Signed)
Unable to get pulse ox reading due to pt.'s hands being too cold

## 2017-10-24 NOTE — Discharge Instructions (Signed)
Follow-up with your family doctor Dr. Margo AyeHall next week.  He will get nurses to check your dressing twice a week.  And also seen the wound care doctor and 1-2 weeks

## 2017-10-27 LAB — AEROBIC CULTURE W GRAM STAIN (SUPERFICIAL SPECIMEN)

## 2017-10-27 LAB — AEROBIC CULTURE  (SUPERFICIAL SPECIMEN)

## 2017-10-28 ENCOUNTER — Telehealth: Payer: Self-pay

## 2017-10-28 NOTE — Telephone Encounter (Signed)
Post ED Visit - Positive Culture Follow-up  Culture report reviewed by antimicrobial stewardship pharmacist:  []  Enzo BiNathan Batchelder, Pharm.D. []  Celedonio MiyamotoJeremy Frens, Pharm.D., BCPS AQ-ID []  Garvin FilaMike Maccia, Pharm.D., BCPS []  Georgina PillionElizabeth Martin, Pharm.D., BCPS []  TerminousMinh Pham, [address]D., BCPS, AAHIVP []  Estella HuskMichelle Turner, Pharm.D., BCPS, AAHIVP []  Lysle Pearlachel Rumbarger, PharmD, BCPS []  Blake DivineShannon Parkey, PharmD []  Pollyann SamplesAndy Johnston, PharmD, BCPS [facility] Manc[facility]il Pharm D Positive Aerobic culture Treated with Doxycycline, organism sensitive to the same and no further patient follow-up is required at this time.  Jerry CarasCullom, Kesha Hurrell Burnett 10/28/2017, 8:48 AM

## 2017-10-29 LAB — CULTURE, BLOOD (ROUTINE X 2)
CULTURE: NO GROWTH
CULTURE: NO GROWTH
SPECIAL REQUESTS: ADEQUATE
SPECIAL REQUESTS: ADEQUATE

## 2017-10-30 ENCOUNTER — Telehealth: Payer: Self-pay | Admitting: Emergency Medicine

## 2017-10-30 NOTE — Telephone Encounter (Signed)
Post ED Visit - Positive Culture Follow-up  Culture report reviewed by antimicrobial stewardship pharmacist:  []  Enzo BiNathan Batchelder, Pharm.D. []  Celedonio MiyamotoJeremy Frens, Pharm.D., BCPS AQ-ID [x]  Garvin FilaMike Maccia, Pharm.D., BCPS []  Georgina PillionElizabeth Martin, Pharm.D., BCPS []  Roaring SpringMinh Pham, [address]Pharm[address]D., BCPS, AAHIVP []  Estella HuskMichelle Turner, Pharm.D., BCPS, AAHIVP []  Lysle Pearlachel Rumbarger, PharmD, BCPS []  Blake DivineShannon Parkey, PharmD []  Pollyann SamplesAndy Johnston, PharmD, BCPS  Positive blood culture Treated with doxycycline and cephalexin, organism sensitive to the same and no further patient follow-up is required at this time.  Berle MullMiller, Daphyne Miguez 10/30/2017, 9:46 AM

## 2018-01-16 ENCOUNTER — Ambulatory Visit: Payer: Self-pay | Admitting: *Deleted

## 2018-04-04 DEATH — deceased

## 2018-06-04 DEATH — deceased

## 2024-09-05 ENCOUNTER — Other Ambulatory Visit (HOSPITAL_COMMUNITY): Payer: Self-pay
